# Patient Record
Sex: Male | Born: 1980 | Race: White | Hispanic: No | Marital: Married | Smoking: Current some day smoker
Health system: Southern US, Community
[De-identification: ages and names within clinical notes are randomized; demographics above are authoritative.]

## PROBLEM LIST (undated history)

## (undated) DIAGNOSIS — G931 Anoxic brain damage, not elsewhere classified: Secondary | ICD-10-CM

## (undated) DIAGNOSIS — W3400XA Accidental discharge from unspecified firearms or gun, initial encounter: Secondary | ICD-10-CM

## (undated) DIAGNOSIS — E669 Obesity, unspecified: Secondary | ICD-10-CM

## (undated) DIAGNOSIS — G8929 Other chronic pain: Secondary | ICD-10-CM

## (undated) DIAGNOSIS — N390 Urinary tract infection, site not specified: Secondary | ICD-10-CM

## (undated) DIAGNOSIS — G249 Dystonia, unspecified: Secondary | ICD-10-CM

## (undated) DIAGNOSIS — F329 Major depressive disorder, single episode, unspecified: Secondary | ICD-10-CM

## (undated) DIAGNOSIS — G8250 Quadriplegia, unspecified: Secondary | ICD-10-CM

## (undated) HISTORY — DX: Anoxic brain damage, not elsewhere classified: G93.1

## (undated) HISTORY — PX: TONSILLECTOMY: SUR1361

## (undated) HISTORY — DX: Accidental discharge from unspecified firearms or gun, initial encounter: W34.00XA

## (undated) HISTORY — DX: Dystonia, unspecified: G24.9

## (undated) HISTORY — DX: Quadriplegia, unspecified: G82.50

## (undated) SURGERY — Surgical Case
Anesthesia: *Unknown

---

## 2017-10-12 ENCOUNTER — Encounter: Payer: Self-pay | Admitting: Emergency Medicine

## 2017-10-12 ENCOUNTER — Emergency Department: Payer: Self-pay

## 2017-10-12 ENCOUNTER — Emergency Department
Admission: EM | Admit: 2017-10-12 | Discharge: 2017-10-12 | Discharge status: Against Medical Advice | Payer: Self-pay | Attending: Emergency Medicine | Admitting: Emergency Medicine

## 2017-10-12 DIAGNOSIS — R079 Chest pain, unspecified: Secondary | ICD-10-CM | POA: Insufficient documentation

## 2017-10-12 DIAGNOSIS — Z5321 Procedure and treatment not carried out due to patient leaving prior to being seen by health care provider: Secondary | ICD-10-CM | POA: Insufficient documentation

## 2017-10-12 LAB — CBC
HCT: 43.1 % (ref 40.0–52.0)
Hemoglobin: 15.5 g/dL (ref 13.0–18.0)
MCH: 31.6 pg (ref 26.0–34.0)
MCHC: 35.9 g/dL (ref 32.0–36.0)
MCV: 87.9 fL (ref 80.0–100.0)
Platelets: 307 10*3/uL (ref 150–440)
RBC: 4.9 MIL/uL (ref 4.40–5.90)
RDW: 15 % — ABNORMAL HIGH (ref 11.5–14.5)
WBC: 9.5 10*3/uL (ref 3.8–10.6)

## 2017-10-12 LAB — TROPONIN I: Troponin I: <0.03 ng/mL (ref <0.03)

## 2017-10-12 NOTE — ED Triage Notes (Signed)
Pt reports feels like someone is sitting on his chest and feels SOB and has some discomfort in his left arm.

## 2017-10-12 NOTE — ED Notes (Signed)
Called for room x 2.  

## 2017-10-12 NOTE — ED Notes (Signed)
Called for room x 1.  

## 2017-10-17 ENCOUNTER — Telehealth: Payer: Self-pay | Admitting: Emergency Medicine
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
## 2018-06-22 DIAGNOSIS — Z6836 Body mass index (BMI) 36.0-36.9, adult: Secondary | ICD-10-CM | POA: Insufficient documentation

## 2018-06-22 DIAGNOSIS — E6609 Other obesity due to excess calories: Secondary | ICD-10-CM | POA: Insufficient documentation

## 2018-06-22 DIAGNOSIS — F17210 Nicotine dependence, cigarettes, uncomplicated: Secondary | ICD-10-CM | POA: Insufficient documentation

## 2018-10-18 DIAGNOSIS — F331 Major depressive disorder, recurrent, moderate: Secondary | ICD-10-CM | POA: Diagnosis not present

## 2018-10-18 DIAGNOSIS — N529 Male erectile dysfunction, unspecified: Secondary | ICD-10-CM | POA: Diagnosis not present

## 2018-10-18 DIAGNOSIS — R41840 Attention and concentration deficit: Secondary | ICD-10-CM | POA: Diagnosis not present

## 2018-10-18 DIAGNOSIS — M549 Dorsalgia, unspecified: Secondary | ICD-10-CM | POA: Diagnosis not present

## 2018-12-13 DIAGNOSIS — M549 Dorsalgia, unspecified: Secondary | ICD-10-CM | POA: Diagnosis not present

## 2018-12-13 DIAGNOSIS — F5101 Primary insomnia: Secondary | ICD-10-CM | POA: Diagnosis not present

## 2018-12-13 DIAGNOSIS — R41840 Attention and concentration deficit: Secondary | ICD-10-CM | POA: Diagnosis not present

## 2018-12-13 DIAGNOSIS — F331 Major depressive disorder, recurrent, moderate: Secondary | ICD-10-CM | POA: Diagnosis not present

## 2019-03-14 DIAGNOSIS — R41840 Attention and concentration deficit: Secondary | ICD-10-CM | POA: Diagnosis not present

## 2019-03-14 DIAGNOSIS — F5101 Primary insomnia: Secondary | ICD-10-CM | POA: Diagnosis not present

## 2019-03-14 DIAGNOSIS — N529 Male erectile dysfunction, unspecified: Secondary | ICD-10-CM | POA: Diagnosis not present

## 2019-03-14 DIAGNOSIS — F331 Major depressive disorder, recurrent, moderate: Secondary | ICD-10-CM | POA: Diagnosis not present

## 2019-03-14 DIAGNOSIS — M549 Dorsalgia, unspecified: Secondary | ICD-10-CM | POA: Diagnosis not present

## 2019-03-28 DIAGNOSIS — Z20828 Contact with and (suspected) exposure to other viral communicable diseases: Secondary | ICD-10-CM | POA: Diagnosis not present

## 2019-03-28 DIAGNOSIS — U071 COVID-19: Secondary | ICD-10-CM | POA: Diagnosis not present

## 2019-04-08 DIAGNOSIS — Z20828 Contact with and (suspected) exposure to other viral communicable diseases: Secondary | ICD-10-CM | POA: Diagnosis not present

## 2019-04-08 DIAGNOSIS — U071 COVID-19: Secondary | ICD-10-CM | POA: Diagnosis not present

## 2019-04-11 DIAGNOSIS — Z20828 Contact with and (suspected) exposure to other viral communicable diseases: Secondary | ICD-10-CM | POA: Diagnosis not present

## 2019-04-11 DIAGNOSIS — U071 COVID-19: Secondary | ICD-10-CM | POA: Diagnosis not present

## 2019-06-06 ENCOUNTER — Inpatient Hospital Stay (HOSPITAL_COMMUNITY)
Admission: EM | Admit: 2019-06-06 | Discharge: 2019-07-19 | DRG: 003 | Disposition: A | Discharge status: Rehabilitation Facility | Payer: Medicaid Other | Attending: Surgery | Admitting: Surgery

## 2019-06-06 ENCOUNTER — Emergency Department (HOSPITAL_COMMUNITY): Payer: Medicaid Other

## 2019-06-06 ENCOUNTER — Inpatient Hospital Stay (HOSPITAL_COMMUNITY): Payer: Medicaid Other

## 2019-06-06 DIAGNOSIS — D62 Acute posthemorrhagic anemia: Secondary | ICD-10-CM

## 2019-06-06 DIAGNOSIS — G249 Dystonia, unspecified: Secondary | ICD-10-CM

## 2019-06-06 DIAGNOSIS — S069X3D Unspecified intracranial injury with loss of consciousness of 1 hour to 5 hours 59 minutes, subsequent encounter: Secondary | ICD-10-CM | POA: Diagnosis not present

## 2019-06-06 DIAGNOSIS — R0602 Shortness of breath: Secondary | ICD-10-CM | POA: Diagnosis not present

## 2019-06-06 DIAGNOSIS — G8250 Quadriplegia, unspecified: Secondary | ICD-10-CM | POA: Diagnosis not present

## 2019-06-06 DIAGNOSIS — Z01818 Encounter for other preprocedural examination: Secondary | ICD-10-CM

## 2019-06-06 DIAGNOSIS — Y92230 Patient room in hospital as the place of occurrence of the external cause: Secondary | ICD-10-CM | POA: Diagnosis not present

## 2019-06-06 DIAGNOSIS — F3130 Bipolar disorder, current episode depressed, mild or moderate severity, unspecified: Secondary | ICD-10-CM | POA: Diagnosis not present

## 2019-06-06 DIAGNOSIS — M62838 Other muscle spasm: Secondary | ICD-10-CM | POA: Diagnosis not present

## 2019-06-06 DIAGNOSIS — S0240CA Maxillary fracture, right side, initial encounter for closed fracture: Secondary | ICD-10-CM | POA: Diagnosis present

## 2019-06-06 DIAGNOSIS — J95851 Ventilator associated pneumonia: Secondary | ICD-10-CM | POA: Diagnosis not present

## 2019-06-06 DIAGNOSIS — Y95 Nosocomial condition: Secondary | ICD-10-CM | POA: Diagnosis not present

## 2019-06-06 DIAGNOSIS — Z6834 Body mass index (BMI) 34.0-34.9, adult: Secondary | ICD-10-CM

## 2019-06-06 DIAGNOSIS — G894 Chronic pain syndrome: Secondary | ICD-10-CM | POA: Diagnosis not present

## 2019-06-06 DIAGNOSIS — S0285XA Fracture of orbit, unspecified, initial encounter for closed fracture: Secondary | ICD-10-CM | POA: Diagnosis present

## 2019-06-06 DIAGNOSIS — I10 Essential (primary) hypertension: Secondary | ICD-10-CM | POA: Diagnosis not present

## 2019-06-06 DIAGNOSIS — I469 Cardiac arrest, cause unspecified: Secondary | ICD-10-CM | POA: Diagnosis not present

## 2019-06-06 DIAGNOSIS — S022XXA Fracture of nasal bones, initial encounter for closed fracture: Secondary | ICD-10-CM | POA: Diagnosis present

## 2019-06-06 DIAGNOSIS — S0242XD Fracture of alveolus of maxilla, subsequent encounter for fracture with routine healing: Secondary | ICD-10-CM | POA: Diagnosis not present

## 2019-06-06 DIAGNOSIS — Y838 Other surgical procedures as the cause of abnormal reaction of the patient, or of later complication, without mention of misadventure at the time of the procedure: Secondary | ICD-10-CM | POA: Diagnosis not present

## 2019-06-06 DIAGNOSIS — S069X2S Unspecified intracranial injury with loss of consciousness of 31 minutes to 59 minutes, sequela: Secondary | ICD-10-CM | POA: Diagnosis not present

## 2019-06-06 DIAGNOSIS — S058X1A Other injuries of right eye and orbit, initial encounter: Secondary | ICD-10-CM | POA: Diagnosis present

## 2019-06-06 DIAGNOSIS — H543 Unqualified visual loss, both eyes: Secondary | ICD-10-CM | POA: Diagnosis present

## 2019-06-06 DIAGNOSIS — Y848 Other medical procedures as the cause of abnormal reaction of the patient, or of later complication, without mention of misadventure at the time of the procedure: Secondary | ICD-10-CM | POA: Diagnosis not present

## 2019-06-06 DIAGNOSIS — Z79899 Other long term (current) drug therapy: Secondary | ICD-10-CM

## 2019-06-06 DIAGNOSIS — S0219XA Other fracture of base of skull, initial encounter for closed fracture: Principal | ICD-10-CM | POA: Diagnosis present

## 2019-06-06 DIAGNOSIS — Y249XXA Unspecified firearm discharge, undetermined intent, initial encounter: Secondary | ICD-10-CM

## 2019-06-06 DIAGNOSIS — Z9981 Dependence on supplemental oxygen: Secondary | ICD-10-CM | POA: Diagnosis not present

## 2019-06-06 DIAGNOSIS — Z818 Family history of other mental and behavioral disorders: Secondary | ICD-10-CM

## 2019-06-06 DIAGNOSIS — F0781 Postconcussional syndrome: Secondary | ICD-10-CM | POA: Diagnosis not present

## 2019-06-06 DIAGNOSIS — S06339A Contusion and laceration of cerebrum, unspecified, with loss of consciousness of unspecified duration, initial encounter: Secondary | ICD-10-CM | POA: Diagnosis present

## 2019-06-06 DIAGNOSIS — Y833 Surgical operation with formation of external stoma as the cause of abnormal reaction of the patient, or of later complication, without mention of misadventure at the time of the procedure: Secondary | ICD-10-CM | POA: Diagnosis not present

## 2019-06-06 DIAGNOSIS — T8130XA Disruption of wound, unspecified, initial encounter: Secondary | ICD-10-CM | POA: Diagnosis not present

## 2019-06-06 DIAGNOSIS — J150 Pneumonia due to Klebsiella pneumoniae: Secondary | ICD-10-CM | POA: Diagnosis not present

## 2019-06-06 DIAGNOSIS — E668 Other obesity: Secondary | ICD-10-CM | POA: Diagnosis present

## 2019-06-06 DIAGNOSIS — F909 Attention-deficit hyperactivity disorder, unspecified type: Secondary | ICD-10-CM | POA: Diagnosis present

## 2019-06-06 DIAGNOSIS — G8929 Other chronic pain: Secondary | ICD-10-CM | POA: Diagnosis present

## 2019-06-06 DIAGNOSIS — R1310 Dysphagia, unspecified: Secondary | ICD-10-CM

## 2019-06-06 DIAGNOSIS — T859XXD Unspecified complication of internal prosthetic device, implant and graft, subsequent encounter: Secondary | ICD-10-CM

## 2019-06-06 DIAGNOSIS — Z23 Encounter for immunization: Secondary | ICD-10-CM | POA: Diagnosis not present

## 2019-06-06 DIAGNOSIS — Z20822 Contact with and (suspected) exposure to covid-19: Secondary | ICD-10-CM | POA: Diagnosis not present

## 2019-06-06 DIAGNOSIS — Z9911 Dependence on respirator [ventilator] status: Secondary | ICD-10-CM

## 2019-06-06 DIAGNOSIS — J9601 Acute respiratory failure with hypoxia: Secondary | ICD-10-CM | POA: Diagnosis not present

## 2019-06-06 DIAGNOSIS — Z915 Personal history of self-harm: Secondary | ICD-10-CM

## 2019-06-06 DIAGNOSIS — S0183XA Puncture wound without foreign body of other part of head, initial encounter: Secondary | ICD-10-CM

## 2019-06-06 DIAGNOSIS — R000 Tachycardia, unspecified: Secondary | ICD-10-CM | POA: Diagnosis not present

## 2019-06-06 DIAGNOSIS — T8131XA Disruption of external operation (surgical) wound, not elsewhere classified, initial encounter: Secondary | ICD-10-CM | POA: Diagnosis not present

## 2019-06-06 DIAGNOSIS — W3400XA Accidental discharge from unspecified firearms or gun, initial encounter: Secondary | ICD-10-CM | POA: Diagnosis not present

## 2019-06-06 DIAGNOSIS — H40051 Ocular hypertension, right eye: Secondary | ICD-10-CM | POA: Diagnosis present

## 2019-06-06 DIAGNOSIS — E876 Hypokalemia: Secondary | ICD-10-CM | POA: Diagnosis not present

## 2019-06-06 DIAGNOSIS — J9690 Respiratory failure, unspecified, unspecified whether with hypoxia or hypercapnia: Secondary | ICD-10-CM

## 2019-06-06 DIAGNOSIS — R4182 Altered mental status, unspecified: Secondary | ICD-10-CM | POA: Diagnosis not present

## 2019-06-06 DIAGNOSIS — F319 Bipolar disorder, unspecified: Secondary | ICD-10-CM | POA: Diagnosis not present

## 2019-06-06 DIAGNOSIS — Z931 Gastrostomy status: Secondary | ICD-10-CM

## 2019-06-06 DIAGNOSIS — S069X9S Unspecified intracranial injury with loss of consciousness of unspecified duration, sequela: Secondary | ICD-10-CM | POA: Diagnosis not present

## 2019-06-06 DIAGNOSIS — J939 Pneumothorax, unspecified: Secondary | ICD-10-CM | POA: Diagnosis not present

## 2019-06-06 DIAGNOSIS — F3131 Bipolar disorder, current episode depressed, mild: Secondary | ICD-10-CM | POA: Diagnosis present

## 2019-06-06 DIAGNOSIS — J189 Pneumonia, unspecified organism: Secondary | ICD-10-CM

## 2019-06-06 DIAGNOSIS — R7401 Elevation of levels of liver transaminase levels: Secondary | ICD-10-CM | POA: Diagnosis not present

## 2019-06-06 DIAGNOSIS — Z4659 Encounter for fitting and adjustment of other gastrointestinal appliance and device: Secondary | ICD-10-CM

## 2019-06-06 DIAGNOSIS — Z789 Other specified health status: Secondary | ICD-10-CM

## 2019-06-06 DIAGNOSIS — F17210 Nicotine dependence, cigarettes, uncomplicated: Secondary | ICD-10-CM | POA: Diagnosis present

## 2019-06-06 DIAGNOSIS — S022XXD Fracture of nasal bones, subsequent encounter for fracture with routine healing: Secondary | ICD-10-CM | POA: Diagnosis not present

## 2019-06-06 DIAGNOSIS — K9423 Gastrostomy malfunction: Secondary | ICD-10-CM | POA: Diagnosis not present

## 2019-06-06 DIAGNOSIS — M4802 Spinal stenosis, cervical region: Secondary | ICD-10-CM | POA: Diagnosis not present

## 2019-06-06 DIAGNOSIS — S0242XA Fracture of alveolus of maxilla, initial encounter for closed fracture: Secondary | ICD-10-CM | POA: Diagnosis present

## 2019-06-06 DIAGNOSIS — R443 Hallucinations, unspecified: Secondary | ICD-10-CM | POA: Diagnosis not present

## 2019-06-06 DIAGNOSIS — F1290 Cannabis use, unspecified, uncomplicated: Secondary | ICD-10-CM | POA: Diagnosis present

## 2019-06-06 DIAGNOSIS — T07XXXA Unspecified multiple injuries, initial encounter: Secondary | ICD-10-CM | POA: Diagnosis not present

## 2019-06-06 DIAGNOSIS — X749XXA Intentional self-harm by unspecified firearm discharge, initial encounter: Secondary | ICD-10-CM

## 2019-06-06 DIAGNOSIS — T1490XA Injury, unspecified, initial encounter: Secondary | ICD-10-CM | POA: Diagnosis not present

## 2019-06-06 DIAGNOSIS — S0219XD Other fracture of base of skull, subsequent encounter for fracture with routine healing: Secondary | ICD-10-CM | POA: Diagnosis not present

## 2019-06-06 DIAGNOSIS — R7303 Prediabetes: Secondary | ICD-10-CM | POA: Diagnosis not present

## 2019-06-06 DIAGNOSIS — F4310 Post-traumatic stress disorder, unspecified: Secondary | ICD-10-CM | POA: Diagnosis not present

## 2019-06-06 DIAGNOSIS — J9503 Malfunction of tracheostomy stoma: Secondary | ICD-10-CM | POA: Diagnosis not present

## 2019-06-06 DIAGNOSIS — Z79891 Long term (current) use of opiate analgesic: Secondary | ICD-10-CM

## 2019-06-06 DIAGNOSIS — D3130 Benign neoplasm of unspecified choroid: Secondary | ICD-10-CM | POA: Diagnosis present

## 2019-06-06 DIAGNOSIS — M6289 Other specified disorders of muscle: Secondary | ICD-10-CM | POA: Diagnosis not present

## 2019-06-06 DIAGNOSIS — N390 Urinary tract infection, site not specified: Secondary | ICD-10-CM | POA: Diagnosis not present

## 2019-06-06 DIAGNOSIS — G931 Anoxic brain damage, not elsewhere classified: Secondary | ICD-10-CM | POA: Diagnosis not present

## 2019-06-06 DIAGNOSIS — R252 Cramp and spasm: Secondary | ICD-10-CM | POA: Diagnosis not present

## 2019-06-06 DIAGNOSIS — Z4682 Encounter for fitting and adjustment of non-vascular catheter: Secondary | ICD-10-CM

## 2019-06-06 DIAGNOSIS — M5441 Lumbago with sciatica, right side: Secondary | ICD-10-CM | POA: Diagnosis present

## 2019-06-06 DIAGNOSIS — S0184XD Puncture wound with foreign body of other part of head, subsequent encounter: Secondary | ICD-10-CM | POA: Diagnosis not present

## 2019-06-06 DIAGNOSIS — S069X3S Unspecified intracranial injury with loss of consciousness of 1 hour to 5 hours 59 minutes, sequela: Secondary | ICD-10-CM | POA: Diagnosis not present

## 2019-06-06 DIAGNOSIS — E870 Hyperosmolality and hypernatremia: Secondary | ICD-10-CM | POA: Diagnosis not present

## 2019-06-06 DIAGNOSIS — R030 Elevated blood-pressure reading, without diagnosis of hypertension: Secondary | ICD-10-CM | POA: Diagnosis not present

## 2019-06-06 DIAGNOSIS — R739 Hyperglycemia, unspecified: Secondary | ICD-10-CM | POA: Diagnosis not present

## 2019-06-06 DIAGNOSIS — H05233 Hemorrhage of bilateral orbit: Secondary | ICD-10-CM | POA: Diagnosis present

## 2019-06-06 DIAGNOSIS — I639 Cerebral infarction, unspecified: Secondary | ICD-10-CM | POA: Diagnosis not present

## 2019-06-06 HISTORY — DX: Other chronic pain: G89.29

## 2019-06-06 HISTORY — DX: Major depressive disorder, single episode, unspecified: F32.9

## 2019-06-06 HISTORY — DX: Obesity, unspecified: E66.9

## 2019-06-06 LAB — CBC
HCT: 44.3 % (ref 39.0–52.0)
Hemoglobin: 14.9 g/dL (ref 13.0–17.0)
MCH: 31.8 pg (ref 26.0–34.0)
MCHC: 33.6 g/dL (ref 30.0–36.0)
MCV: 94.5 fL (ref 80.0–100.0)
Platelets: 399 10*3/uL (ref 150–400)
RBC: 4.69 MIL/uL (ref 4.22–5.81)
RDW: 14.3 % (ref 11.5–15.5)
WBC: 9.7 10*3/uL (ref 4.0–10.5)
nRBC: 0 % (ref 0.0–0.2)

## 2019-06-06 LAB — HEMOGLOBIN A1C
Hgb A1c MFr Bld: 6.2 % — ABNORMAL HIGH (ref 4.8–5.6)
Mean Plasma Glucose: 131.24 mg/dL

## 2019-06-06 LAB — COMPREHENSIVE METABOLIC PANEL
ALT: 45 U/L — ABNORMAL HIGH (ref 0–44)
AST: 37 U/L (ref 15–41)
Albumin: 3.5 g/dL (ref 3.5–5.0)
Alkaline Phosphatase: 80 U/L (ref 38–126)
Anion gap: 15 (ref 5–15)
BUN: 9 mg/dL (ref 6–20)
CO2: 20 mmol/L — ABNORMAL LOW (ref 22–32)
Calcium: 8.6 mg/dL — ABNORMAL LOW (ref 8.9–10.3)
Chloride: 102 mmol/L (ref 98–111)
Creatinine, Ser: 1.24 mg/dL (ref 0.61–1.24)
GFR calc Af Amer: >60 mL/min (ref >60)
GFR calc non Af Amer: >60 mL/min (ref >60)
Glucose, Bld: 226 mg/dL — ABNORMAL HIGH (ref 70–99)
Potassium: 4.5 mmol/L (ref 3.5–5.1)
Sodium: 137 mmol/L (ref 135–145)
Total Bilirubin: 1.1 mg/dL (ref 0.3–1.2)
Total Protein: 6.5 g/dL (ref 6.5–8.1)

## 2019-06-06 LAB — POCT I-STAT 7, (LYTES, BLD GAS, ICA,H+H)
Acid-Base Excess: 0 mmol/L (ref 0.0–2.0)
Bicarbonate: 27.3 mmol/L (ref 20.0–28.0)
Calcium, Ion: 1.23 mmol/L (ref 1.15–1.40)
HCT: 44 % (ref 39.0–52.0)
Hemoglobin: 15 g/dL (ref 13.0–17.0)
O2 Saturation: 100 %
Patient temperature: 95.7
Potassium: 3.8 mmol/L (ref 3.5–5.1)
Sodium: 141 mmol/L (ref 135–145)
TCO2: 29 mmol/L (ref 22–32)
pCO2 arterial: 47.8 mmHg (ref 32.0–48.0)
pH, Arterial: 7.357 (ref 7.350–7.450)
pO2, Arterial: 294 mmHg — ABNORMAL HIGH (ref 83.0–108.0)

## 2019-06-06 LAB — I-STAT CHEM 8, ED
BUN: 10 mg/dL (ref 6–20)
Calcium, Ion: 1.01 mmol/L — ABNORMAL LOW (ref 1.15–1.40)
Chloride: 105 mmol/L (ref 98–111)
Creatinine, Ser: 1.2 mg/dL (ref 0.61–1.24)
Glucose, Bld: 224 mg/dL — ABNORMAL HIGH (ref 70–99)
HCT: 45 % (ref 39.0–52.0)
Hemoglobin: 15.3 g/dL (ref 13.0–17.0)
Potassium: 3.7 mmol/L (ref 3.5–5.1)
Sodium: 139 mmol/L (ref 135–145)
TCO2: 21 mmol/L — ABNORMAL LOW (ref 22–32)

## 2019-06-06 LAB — ETHANOL: Alcohol, Ethyl (B): <10 mg/dL (ref <10)

## 2019-06-06 LAB — GLUCOSE, CAPILLARY: Glucose-Capillary: 123 mg/dL — ABNORMAL HIGH (ref 70–99)

## 2019-06-06 LAB — LACTIC ACID, PLASMA
Lactic Acid, Venous: 3.3 mmol/L (ref 0.5–1.9)
Lactic Acid, Venous: 1.8 mmol/L (ref 0.5–1.9)

## 2019-06-06 LAB — SAMPLE TO BLOOD BANK

## 2019-06-06 LAB — PROTIME-INR
INR: 0.9 (ref 0.8–1.2)
Prothrombin Time: 12 seconds (ref 11.4–15.2)

## 2019-06-06 LAB — TRIGLYCERIDES: Triglycerides: 222 mg/dL — ABNORMAL HIGH (ref <150)

## 2019-06-06 LAB — SARS CORONAVIRUS 2 BY RT PCR (HOSPITAL ORDER, PERFORMED IN ~~LOC~~ HOSPITAL LAB): SARS Coronavirus 2: NEGATIVE

## 2019-06-06 MED ORDER — ROCURONIUM BROMIDE 10 MG/ML (PF) SYRINGE
PREFILLED_SYRINGE | INTRAVENOUS | Status: AC
  Filled 2019-06-06: qty 10

## 2019-06-06 MED ORDER — PROPOFOL 1000 MG/100ML IV EMUL
INTRAVENOUS | Status: AC
  Filled 2019-06-06: qty 100

## 2019-06-06 MED ORDER — CHLORHEXIDINE GLUCONATE CLOTH 2 % EX PADS
6.0000 | MEDICATED_PAD | Freq: Every day | CUTANEOUS | Status: DC
  Administered 2019-06-07 – 2019-06-11 (×5): 6 via TOPICAL

## 2019-06-06 MED ORDER — MIDAZOLAM HCL 5 MG/5ML IJ SOLN
INTRAMUSCULAR | Status: AC | PRN
  Administered 2019-06-06: 4 mg via INTRAVENOUS

## 2019-06-06 MED ORDER — FENTANYL 2500MCG IN NS 250ML (10MCG/ML) PREMIX INFUSION
25.0000 ug/h | INTRAVENOUS | Status: DC
  Administered 2019-06-06: 140 ug/h via INTRAVENOUS
  Filled 2019-06-06: qty 250

## 2019-06-06 MED ORDER — TETANUS-DIPHTH-ACELL PERTUSSIS 5-2.5-18.5 LF-MCG/0.5 IM SUSP
0.5000 mL | Freq: Once | INTRAMUSCULAR | Status: AC
  Administered 2019-06-06: 0.5 mL via INTRAMUSCULAR
  Filled 2019-06-06: qty 0.5

## 2019-06-06 MED ORDER — ONDANSETRON 4 MG PO TBDP: 4.0000 mg | ORAL_TABLET | Freq: Four times a day (QID) | ORAL | Status: DC | PRN

## 2019-06-06 MED ORDER — MIDAZOLAM HCL 2 MG/2ML IJ SOLN
INTRAMUSCULAR | Status: AC
  Filled 2019-06-06: qty 4

## 2019-06-06 MED ORDER — ROCURONIUM BROMIDE 50 MG/5ML IV SOLN
INTRAVENOUS | Status: AC | PRN
  Administered 2019-06-06: 100 mg via INTRAVENOUS

## 2019-06-06 MED ORDER — INSULIN ASPART 100 UNIT/ML ~~LOC~~ SOLN
0.0000 [IU] | Freq: Three times a day (TID) | SUBCUTANEOUS | Status: DC
  Administered 2019-06-07 (×3): 2 [IU] via SUBCUTANEOUS
  Administered 2019-06-08: 3 [IU] via SUBCUTANEOUS
  Administered 2019-06-08: 2 [IU] via SUBCUTANEOUS
  Administered 2019-06-08: 3 [IU] via SUBCUTANEOUS
  Administered 2019-06-09 – 2019-06-10 (×3): 2 [IU] via SUBCUTANEOUS

## 2019-06-06 MED ORDER — ONDANSETRON HCL 4 MG/2ML IJ SOLN
4.0000 mg | Freq: Four times a day (QID) | INTRAMUSCULAR | Status: DC | PRN
  Administered 2019-06-15: 4 mg via INTRAVENOUS
  Filled 2019-06-06: qty 2

## 2019-06-06 MED ORDER — FENTANYL BOLUS VIA INFUSION
50.0000 ug | INTRAVENOUS | Status: DC | PRN
  Administered 2019-06-10 – 2019-06-11 (×4): 50 ug via INTRAVENOUS
  Filled 2019-06-06: qty 50

## 2019-06-06 MED ORDER — IOHEXOL 350 MG/ML SOLN
75.0000 mL | Freq: Once | INTRAVENOUS | Status: AC | PRN
  Administered 2019-06-06: 75 mL via INTRAVENOUS

## 2019-06-06 MED ORDER — SODIUM CHLORIDE 0.9 % IV SOLN: INTRAVENOUS | Status: DC

## 2019-06-06 MED ORDER — FENTANYL BOLUS VIA INFUSION
50.0000 ug | INTRAVENOUS | Status: DC | PRN
  Administered 2019-06-15: 50 ug via INTRAVENOUS
  Filled 2019-06-06: qty 50

## 2019-06-06 MED ORDER — MIDAZOLAM HCL 2 MG/2ML IJ SOLN: 2.0000 mg | INTRAMUSCULAR | Status: DC | PRN

## 2019-06-06 MED ORDER — PROPOFOL 1000 MG/100ML IV EMUL
0.0000 ug/kg/min | INTRAVENOUS | Status: DC
  Administered 2019-06-06: 40 ug/kg/min via INTRAVENOUS

## 2019-06-06 MED ORDER — DOCUSATE SODIUM 50 MG/5ML PO LIQD
100.0000 mg | Freq: Two times a day (BID) | ORAL | Status: DC
  Filled 2019-06-06 (×2): qty 10

## 2019-06-06 MED ORDER — ORAL CARE MOUTH RINSE
15.0000 mL | OROMUCOSAL | Status: DC
  Administered 2019-06-06 – 2019-07-08 (×293): 15 mL via OROMUCOSAL

## 2019-06-06 MED ORDER — FENTANYL CITRATE (PF) 100 MCG/2ML IJ SOLN: 50.0000 ug | Freq: Once | INTRAMUSCULAR | Status: DC

## 2019-06-06 MED ORDER — POLYETHYLENE GLYCOL 3350 17 G PO PACK
17.0000 g | PACK | Freq: Every day | ORAL | Status: DC
  Filled 2019-06-06: qty 1

## 2019-06-06 MED ORDER — FENTANYL CITRATE (PF) 100 MCG/2ML IJ SOLN
INTRAMUSCULAR | Status: AC | PRN
  Administered 2019-06-06: 100 ug via INTRAVENOUS

## 2019-06-06 MED ORDER — SODIUM CHLORIDE 0.9 % IV SOLN: INTRAVENOUS | Status: DC | PRN

## 2019-06-06 MED ORDER — CEFAZOLIN SODIUM-DEXTROSE 2-4 GM/100ML-% IV SOLN
2.0000 g | Freq: Once | INTRAVENOUS | Status: AC
  Administered 2019-06-06: 2 g via INTRAVENOUS
  Filled 2019-06-06: qty 100

## 2019-06-06 MED ORDER — CHLORHEXIDINE GLUCONATE 0.12% ORAL RINSE (MEDLINE KIT)
15.0000 mL | Freq: Two times a day (BID) | OROMUCOSAL | Status: DC
  Administered 2019-06-06 – 2019-07-08 (×63): 15 mL via OROMUCOSAL

## 2019-06-06 MED ORDER — PROPOFOL 1000 MG/100ML IV EMUL
0.0000 ug/kg/min | INTRAVENOUS | Status: DC
  Administered 2019-06-07 (×5): 50 ug/kg/min via INTRAVENOUS
  Administered 2019-06-08: 30 ug/kg/min via INTRAVENOUS
  Administered 2019-06-08 (×2): 50 ug/kg/min via INTRAVENOUS
  Administered 2019-06-08: 30 ug/kg/min via INTRAVENOUS
  Administered 2019-06-08 (×2): 50 ug/kg/min via INTRAVENOUS
  Administered 2019-06-08: 40 ug/kg/min via INTRAVENOUS
  Administered 2019-06-09: 30 ug/kg/min via INTRAVENOUS
  Administered 2019-06-09: 50 ug/kg/min via INTRAVENOUS
  Administered 2019-06-09: 25 ug/kg/min via INTRAVENOUS
  Administered 2019-06-09: 50 ug/kg/min via INTRAVENOUS
  Administered 2019-06-09: 40 ug/kg/min via INTRAVENOUS
  Administered 2019-06-09: 50 ug/kg/min via INTRAVENOUS
  Administered 2019-06-10 – 2019-06-11 (×7): 30 ug/kg/min via INTRAVENOUS
  Administered 2019-06-11: 25 ug/kg/min via INTRAVENOUS
  Administered 2019-06-11: 20 ug/kg/min via INTRAVENOUS
  Administered 2019-06-11: 30 ug/kg/min via INTRAVENOUS
  Administered 2019-06-12 – 2019-06-13 (×3): 20 ug/kg/min via INTRAVENOUS
  Filled 2019-06-06 (×4): qty 100
  Filled 2019-06-06: qty 200
  Filled 2019-06-06: qty 100
  Filled 2019-06-06: qty 200
  Filled 2019-06-06 (×30): qty 100
  Filled 2019-06-06: qty 200

## 2019-06-06 MED ORDER — HYDRALAZINE HCL 20 MG/ML IJ SOLN
20.0000 mg | INTRAMUSCULAR | Status: DC | PRN
  Administered 2019-06-08 – 2019-06-21 (×4): 20 mg via INTRAVENOUS
  Filled 2019-06-06 (×6): qty 1

## 2019-06-06 MED ORDER — ETOMIDATE 2 MG/ML IV SOLN
INTRAVENOUS | Status: AC | PRN
  Administered 2019-06-06: 20 mg via INTRAVENOUS

## 2019-06-06 MED ORDER — FENTANYL 2500MCG IN NS 250ML (10MCG/ML) PREMIX INFUSION
25.0000 ug/h | INTRAVENOUS | Status: DC
  Administered 2019-06-07: 200 ug/h via INTRAVENOUS
  Administered 2019-06-07: 100 ug/h via INTRAVENOUS
  Administered 2019-06-08 – 2019-06-10 (×6): 200 ug/h via INTRAVENOUS
  Administered 2019-06-11: 150 ug/h via INTRAVENOUS
  Administered 2019-06-11: 200 ug/h via INTRAVENOUS
  Administered 2019-06-12 – 2019-06-13 (×2): 150 ug/h via INTRAVENOUS
  Administered 2019-06-14 – 2019-06-16 (×2): 100 ug/h via INTRAVENOUS
  Administered 2019-06-17 – 2019-06-18 (×2): 50 ug/h via INTRAVENOUS
  Administered 2019-06-19: 200 ug/h via INTRAVENOUS
  Administered 2019-06-20: 125 ug/h via INTRAVENOUS
  Administered 2019-06-20 – 2019-06-21 (×3): 100 ug/h via INTRAVENOUS
  Administered 2019-06-22: 75 ug/h via INTRAVENOUS
  Filled 2019-06-06 (×25): qty 250

## 2019-06-06 MED ORDER — NICARDIPINE HCL IN NACL 20-0.86 MG/200ML-% IV SOLN
3.0000 mg/h | INTRAVENOUS | Status: DC
  Administered 2019-06-06: 5 mg/h via INTRAVENOUS
  Administered 2019-06-06 – 2019-06-07 (×2): 7.5 mg/h via INTRAVENOUS
  Filled 2019-06-06 (×5): qty 200

## 2019-06-06 MED ORDER — SUCCINYLCHOLINE CHLORIDE 20 MG/ML IJ SOLN
INTRAMUSCULAR | Status: AC | PRN
  Administered 2019-06-06: 150 mg via INTRAVENOUS

## 2019-06-06 MED ORDER — OXYMETAZOLINE HCL 0.05 % NA SOLN
1.0000 | Freq: Once | NASAL | Status: DC
  Filled 2019-06-06: qty 30
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
## 2019-06-06 NOTE — Procedures (Signed)
Intubation Procedure Note Jaz Mallick [id number] 03-28-1980  Procedure: Intubation Indications: Airway protection and maintenance  Procedure Details Consent: Unable to obtain consent because of emergent medical necessity. Time Out: Verified patient identification, verified procedure, site/side was marked, verified correct patient position, special equipment/implants available, medications/allergies/relevent history reviewed, required imaging and test results available.  Performed  Maximum sterile technique was used including gloves and gown.  MAC and 4    Evaluation Hemodynamic Status: BP stable throughout; O2 sats: stable throughout [name] Current Condition: stable Complications: No apparent complications Patient did tolerate procedure well. Chest X-ray ordered to verify placement.  CXR: tube position acceptable.   Rudene Re 06/06/2019

## 2019-06-06 NOTE — ED Notes (Signed)
Right eye is dilated per opthamology
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
## 2019-06-06 NOTE — Progress Notes (Signed)
Post intubation ABG results obtained on ventilator settings of VT: 620, RR: 16, FIO2: 100%, and PEEP:5.  Increased RR to 18 and decreased FIO2 to 60%.  Will continue to monitor.    Ref. Range 06/06/2019 16:44  Sample type Unknown ARTERIAL  pH, Arterial Latest Ref Range: 7.350 - 7.450  7.357  pCO2 arterial Latest Ref Range: 32.0 - 48.0 mmHg 47.8  pO2, Arterial Latest Ref Range: 83.0 - 108.0 mmHg 294 (H)  TCO2 Latest Ref Range: 22 - 32 mmol/L 29  Acid-Base Excess Latest Ref Range: 0.0 - 2.0 mmol/L 0.0  Bicarbonate Latest Ref Range: 20.0 - 28.0 mmol/L 27.3  O2 Saturation Latest Units: % 100.0  Patient temperature Unknown 95.7 F  Collection site Unknown Radial

## 2019-06-06 NOTE — ED Notes (Signed)
Intraoccular pressure measured by ED resident
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
## 2019-06-06 NOTE — ED Notes (Signed)
[place] [employer] Sellers---[phone]  PLEASE NOTIFY IF PATIENT IS BEING TRANSFERRED OR DISCHArRGED>

## 2019-06-07 ENCOUNTER — Inpatient Hospital Stay (HOSPITAL_COMMUNITY): Payer: Medicaid Other

## 2019-06-07 ENCOUNTER — Inpatient Hospital Stay (HOSPITAL_COMMUNITY): Payer: Medicaid Other | Admitting: Certified Registered"

## 2019-06-07 ENCOUNTER — Inpatient Hospital Stay (HOSPITAL_COMMUNITY): Payer: Medicaid Other | Admitting: Anesthesiology

## 2019-06-07 ENCOUNTER — Encounter (HOSPITAL_COMMUNITY): Admission: EM | Disposition: A | Discharge status: Rehabilitation Facility | Payer: Self-pay | Source: Home / Self Care

## 2019-06-07 ENCOUNTER — Encounter (HOSPITAL_COMMUNITY): Payer: Self-pay

## 2019-06-07 HISTORY — PX: TRACHEOSTOMY TUBE PLACEMENT: SHX814

## 2019-06-07 LAB — POCT I-STAT 7, (LYTES, BLD GAS, ICA,H+H)
Acid-base deficit: 10 mmol/L — ABNORMAL HIGH (ref 0.0–2.0)
Bicarbonate: 22.9 mmol/L (ref 20.0–28.0)
Calcium, Ion: 1.35 mmol/L (ref 1.15–1.40)
HCT: 39 % (ref 39.0–52.0)
Hemoglobin: 13.3 g/dL (ref 13.0–17.0)
O2 Saturation: 92 %
Patient temperature: 98.7
Potassium: 3.5 mmol/L (ref 3.5–5.1)
Sodium: 141 mmol/L (ref 135–145)
TCO2: 26 mmol/L (ref 22–32)
pCO2 arterial: 90.6 mmHg (ref 32.0–48.0)
pH, Arterial: 7.011 — CL (ref 7.350–7.450)
pO2, Arterial: 98 mmHg (ref 83.0–108.0)

## 2019-06-07 LAB — GLUCOSE, CAPILLARY
Glucose-Capillary: 144 mg/dL — ABNORMAL HIGH (ref 70–99)
Glucose-Capillary: 138 mg/dL — ABNORMAL HIGH (ref 70–99)
Glucose-Capillary: 137 mg/dL — ABNORMAL HIGH (ref 70–99)
Glucose-Capillary: 215 mg/dL — ABNORMAL HIGH (ref 70–99)

## 2019-06-07 LAB — BASIC METABOLIC PANEL
Anion gap: 8 (ref 5–15)
BUN: 10 mg/dL (ref 6–20)
CO2: 26 mmol/L (ref 22–32)
Calcium: 8.1 mg/dL — ABNORMAL LOW (ref 8.9–10.3)
Chloride: 104 mmol/L (ref 98–111)
Creatinine, Ser: 1.06 mg/dL (ref 0.61–1.24)
GFR calc Af Amer: >60 mL/min (ref >60)
GFR calc non Af Amer: >60 mL/min (ref >60)
Glucose, Bld: 133 mg/dL — ABNORMAL HIGH (ref 70–99)
Potassium: 4.2 mmol/L (ref 3.5–5.1)
Sodium: 138 mmol/L (ref 135–145)

## 2019-06-07 LAB — CBC
HCT: 42.5 % (ref 39.0–52.0)
Hemoglobin: 14.2 g/dL (ref 13.0–17.0)
MCH: 31.3 pg (ref 26.0–34.0)
MCHC: 33.4 g/dL (ref 30.0–36.0)
MCV: 93.8 fL (ref 80.0–100.0)
Platelets: 309 10*3/uL (ref 150–400)
RBC: 4.53 MIL/uL (ref 4.22–5.81)
RDW: 14.6 % (ref 11.5–15.5)
WBC: 9.8 10*3/uL (ref 4.0–10.5)
nRBC: 0 % (ref 0.0–0.2)

## 2019-06-07 LAB — TRIGLYCERIDES: Triglycerides: 169 mg/dL — ABNORMAL HIGH (ref <150)

## 2019-06-07 SURGERY — CREATION, TRACHEOSTOMY
Anesthesia: General | Site: Neck

## 2019-06-07 MED ORDER — MIDAZOLAM 50MG/50ML (1MG/ML) PREMIX INFUSION: 0.5000 mg/h | INTRAVENOUS | Status: DC

## 2019-06-07 MED ORDER — POLYETHYLENE GLYCOL 3350 17 G PO PACK
17.0000 g | PACK | Freq: Every day | ORAL | Status: DC
  Administered 2019-06-08 – 2019-06-22 (×8): 17 g
  Filled 2019-06-07 (×10): qty 1

## 2019-06-07 MED ORDER — MIDAZOLAM HCL 5 MG/5ML IJ SOLN
INTRAMUSCULAR | Status: DC | PRN
  Administered 2019-06-07 (×2): 2 mg via INTRAVENOUS

## 2019-06-07 MED ORDER — ROCURONIUM BROMIDE 10 MG/ML (PF) SYRINGE
PREFILLED_SYRINGE | INTRAVENOUS | Status: AC
  Filled 2019-06-07: qty 10

## 2019-06-07 MED ORDER — PROPOFOL 10 MG/ML IV BOLUS
INTRAVENOUS | Status: DC | PRN
  Administered 2019-06-07: 50 mg via INTRAVENOUS

## 2019-06-07 MED ORDER — DOCUSATE SODIUM 50 MG/5ML PO LIQD
100.0000 mg | Freq: Two times a day (BID) | ORAL | Status: DC
  Administered 2019-06-07 – 2019-06-27 (×22): 100 mg
  Filled 2019-06-07 (×28): qty 10

## 2019-06-07 MED ORDER — MIDAZOLAM HCL 2 MG/2ML IJ SOLN
INTRAMUSCULAR | Status: AC
  Filled 2019-06-07: qty 2

## 2019-06-07 MED ORDER — ROCURONIUM BROMIDE 10 MG/ML (PF) SYRINGE
PREFILLED_SYRINGE | INTRAVENOUS | Status: DC | PRN
  Administered 2019-06-07: 30 mg via INTRAVENOUS
  Administered 2019-06-07 (×2): 20 mg via INTRAVENOUS

## 2019-06-07 MED ORDER — ENOXAPARIN SODIUM 40 MG/0.4ML ~~LOC~~ SOLN
40.0000 mg | Freq: Two times a day (BID) | SUBCUTANEOUS | Status: DC
  Administered 2019-06-07 – 2019-07-19 (×83): 40 mg via SUBCUTANEOUS
  Filled 2019-06-07 (×85): qty 0.4

## 2019-06-07 MED ORDER — FENTANYL CITRATE (PF) 250 MCG/5ML IJ SOLN
INTRAMUSCULAR | Status: AC
  Filled 2019-06-07: qty 5

## 2019-06-07 MED ORDER — IPRATROPIUM-ALBUTEROL 0.5-2.5 (3) MG/3ML IN SOLN: 3.0000 mL | Freq: Four times a day (QID) | RESPIRATORY_TRACT | Status: DC | PRN

## 2019-06-07 MED ORDER — PANTOPRAZOLE SODIUM 40 MG IV SOLR
40.0000 mg | Freq: Every day | INTRAVENOUS | Status: DC
  Administered 2019-06-07 – 2019-06-13 (×7): 40 mg via INTRAVENOUS
  Filled 2019-06-07 (×7): qty 40

## 2019-06-07 MED ORDER — CEFAZOLIN SODIUM-DEXTROSE 2-4 GM/100ML-% IV SOLN
2.0000 g | Freq: Three times a day (TID) | INTRAVENOUS | Status: DC
  Administered 2019-06-07 – 2019-06-11 (×12): 2 g via INTRAVENOUS
  Filled 2019-06-07 (×12): qty 100

## 2019-06-07 MED ORDER — FENTANYL CITRATE (PF) 100 MCG/2ML IJ SOLN
INTRAMUSCULAR | Status: DC | PRN
  Administered 2019-06-07: 50 ug via INTRAVENOUS
  Administered 2019-06-07 (×2): 100 ug via INTRAVENOUS

## 2019-06-07 MED ORDER — MIDAZOLAM 50MG/50ML (1MG/ML) PREMIX INFUSION
0.5000 mg/h | INTRAVENOUS | Status: DC
  Filled 2019-06-07: qty 50

## 2019-06-07 MED ORDER — ONDANSETRON HCL 4 MG/2ML IJ SOLN
INTRAMUSCULAR | Status: AC
  Filled 2019-06-07: qty 2

## 2019-06-07 MED ORDER — LIDOCAINE 2% (20 MG/ML) 5 ML SYRINGE
INTRAMUSCULAR | Status: AC
  Filled 2019-06-07: qty 5

## 2019-06-07 MED ORDER — DEXAMETHASONE SODIUM PHOSPHATE 10 MG/ML IJ SOLN
INTRAMUSCULAR | Status: AC
  Filled 2019-06-07: qty 1

## 2019-06-07 MED ORDER — ONDANSETRON HCL 4 MG/2ML IJ SOLN
INTRAMUSCULAR | Status: DC | PRN
  Administered 2019-06-07: 4 mg via INTRAVENOUS

## 2019-06-07 MED ORDER — LACTATED RINGERS IV SOLN: INTRAVENOUS | Status: DC | PRN

## 2019-06-07 MED ORDER — DEXAMETHASONE SODIUM PHOSPHATE 10 MG/ML IJ SOLN
INTRAMUSCULAR | Status: DC | PRN
  Administered 2019-06-07: 10 mg via INTRAVENOUS

## 2019-06-07 MED ORDER — IPRATROPIUM-ALBUTEROL 0.5-2.5 (3) MG/3ML IN SOLN
RESPIRATORY_TRACT | Status: AC
  Administered 2019-06-07: 3 mL via RESPIRATORY_TRACT
  Filled 2019-06-07: qty 3

## 2019-06-07 SURGICAL SUPPLY — 33 items
BLADE CLIPPER SURG (BLADE) IMPLANT
CANISTER SUCT 3000ML PPV (MISCELLANEOUS) ×2 IMPLANT
COVER SURGICAL LIGHT HANDLE (MISCELLANEOUS) ×2 IMPLANT
COVER WAND RF STERILE (DRAPES) ×2 IMPLANT
DRAPE UTILITY XL STRL (DRAPES) ×2 IMPLANT
DRSG DRAWTEX TRACH 4X4 (GAUZE/BANDAGES/DRESSINGS) ×2 IMPLANT
ELECT CAUTERY BLADE 6.4 (BLADE) ×2 IMPLANT
ELECT REM PT RETURN 9FT ADLT (ELECTROSURGICAL) ×2
ELECTRODE REM PT RTRN 9FT ADLT (ELECTROSURGICAL) ×1 IMPLANT
GAUZE 4X4 16PLY RFD (DISPOSABLE) ×2 IMPLANT
GLOVE BIO SURGEON STRL SZ8 (GLOVE) ×2 IMPLANT
GLOVE BIOGEL PI IND STRL 8 (GLOVE) ×1 IMPLANT
GLOVE BIOGEL PI INDICATOR 8 (GLOVE) ×1
GOWN STRL REUS W/ TWL LRG LVL3 (GOWN DISPOSABLE) ×1 IMPLANT
GOWN STRL REUS W/ TWL XL LVL3 (GOWN DISPOSABLE) ×1 IMPLANT
GOWN STRL REUS W/TWL LRG LVL3 (GOWN DISPOSABLE) ×1
GOWN STRL REUS W/TWL XL LVL3 (GOWN DISPOSABLE) ×1
INTRODUCER TRACH BLUE RHINO 6F (TUBING) ×2 IMPLANT
INTRODUCER TRACH BLUE RHINO 8F (TUBING) IMPLANT
KIT BASIN OR (CUSTOM PROCEDURE TRAY) ×2 IMPLANT
KIT TURNOVER KIT B (KITS) ×2 IMPLANT
NS IRRIG 1000ML POUR BTL (IV SOLUTION) ×2 IMPLANT
PACK EENT II TURBAN DRAPE (CUSTOM PROCEDURE TRAY) ×2 IMPLANT
PAD ARMBOARD 7.5X6 YLW CONV (MISCELLANEOUS) ×4 IMPLANT
PENCIL BUTTON HOLSTER BLD 10FT (ELECTRODE) ×2 IMPLANT
SPONGE INTESTINAL PEANUT (DISPOSABLE) ×2 IMPLANT
SUT SILK 2 0 PERMA HAND 18 BK (SUTURE) ×2 IMPLANT
SUT SILK 2 0SH CR/8 30 (SUTURE) ×2 IMPLANT
SUT VICRYL AB 3 0 TIES (SUTURE) ×2 IMPLANT
SYR 20ML LL LF (SYRINGE) ×2 IMPLANT
TOWEL GREEN STERILE (TOWEL DISPOSABLE) ×2 IMPLANT
TOWEL GREEN STERILE FF (TOWEL DISPOSABLE) ×2 IMPLANT
TUBE CONNECTING 12X1/4 (SUCTIONS) ×2 IMPLANT
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
## 2019-06-07 NOTE — Addendum Note (Signed)
Addendum  created 06/07/19 2103 by Duane Boston, MD   Intraprocedure Event edited

## 2019-06-07 NOTE — Progress Notes (Signed)
Chaplain responded to a code blue for this patient.  No family present.  Chaplain obtained information and coordinated with MD to call spouse.  MD updated spouse.  Spouse plans on coming in the morning.  Chaplain will pass on to day Chaplain to check on family.  No other needs identified at this time.  Chaplain available as needed. [place]kin, MDiv.     06/07/19 1958  Clinical Encounter Type  Visited With Health care provider  Visit Type Code  Referral From Nurse  Consult/Referral To Chaplain

## 2019-06-07 NOTE — Progress Notes (Signed)
Patient had not voided all of shift. RN bladder scanned patient and got 550 cc in bladder. Dr. Donne Hazel paged and he said that a temp foley was supposed to be put in down in the ER at about 1500 on 5/27. RN put temp foley in and got 900 cc urine. Foley site clean, dry, and intact. Will continue to monitor patient and urine output.
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
## 2019-06-07 NOTE — Procedures (Signed)
Pt was emergently bronched d/t lost of VS  Initial bronchoscopy via trach ended with the soft tissue at the end of the trach.  This confirmed that the trach was dislodged.  Pt with clear airway and min bloody mucus in trachea down to carina. Both right and legf bronchus were clear. Pt tolerated the procedure well  Pt was placed back on the vent  CXR pending

## 2019-06-07 NOTE — Anesthesia Postprocedure Evaluation (Signed)
Anesthesia Post Note  Patient: Chase Caldwell  Procedure(s) Performed: TRACHEOSTOMY (N/A Neck)     Patient location during evaluation: SICU Anesthesia Type: General Level of consciousness: sedated Pain management: pain level controlled Vital Signs Assessment: post-procedure vital signs reviewed and stable Respiratory status: patient remains intubated per anesthesia plan Cardiovascular status: stable Postop Assessment: no apparent nausea or vomiting Anesthetic complications: no    Last Vitals:  Vitals:   06/07/19 1530 06/07/19 1535  BP: (!) 154/101 (!) 136/92  Pulse: 100 99  Resp: 18 18  Temp:  37 C  SpO2: 97% 96%    Last Pain:  Vitals:   06/07/19 0700  TempSrc: Bladder  PainSc:                  Effie Berkshire

## 2019-06-07 NOTE — Progress Notes (Signed)
Unable to reach family, will plan to move forward with trach using emergent indications as without tracheostomy placement, the patient would be unable to undergo surgical management of his open facial fractures.   Jesusita Oka, MD General and [place] [facility]

## 2019-06-07 NOTE — Procedures (Signed)
Indication procedure: Patient was found to have bilateral pneumothoraces on post bronchoscopy chest x-ray.  Details of procedure: Patient underwent bilateral pigtail catheter placement via Seldinger technique.  There is large amount of air rush bilaterally.  These were secured to the skin using a 3-0 silk that was provided in the kit.  The patient tired procedure well.  Post procedure O2 sat was 98%.  Chest x-ray pending.
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
## 2019-06-07 NOTE — Progress Notes (Signed)
1905 this RN was completing the initial shift assessment and attempting to identify neuro status and alertness. He responded to his name and was attempting to talk. I increased his sedation however he was still attempting to fight and sit up.   I could hear the patient talking around the trach and the exchange of air through his mouth, RT was immediately called.   Once he started desaturating into the low 90s-80's, RT was called again to request their assistance immediately. I began bagging via trach and an extremely large blood clot had come out.   Patient desaturated down into the 30's, respiratory took over bagging and changed the inner cannula. He was extremely difficult to bag and had a 02 saturation of 0 with cyanosis.   He went into PEA and CPR was started- the code was run by Dr.Ramirez; we were able to resuscitate the patient, re-intubate and stabilize.   His saturations weren't coming up and an xray was completed and showed bilateral pneumothoraces and therefore bilateral chest tubes were placed. His ETT tube cuff also ruptured and anesthesia assisted in a tube exchange.   Family notified and will continue to monitor.  Leticia Clas, RN BSN, [place]

## 2019-06-07 NOTE — Plan of Care (Signed)
  Problem: Education: Goal: Knowledge of General Education information will improve Description: Including pain rating scale, medication(s)/side effects and non-pharmacologic comfort measures Outcome: Progressing   Problem: Clinical Measurements: Goal: Ability to maintain clinical measurements within normal limits will improve Outcome: Progressing   Problem: Clinical Measurements: Goal: Will remain free from infection Outcome: Progressing   Problem: Clinical Measurements: Goal: Respiratory complications will improve Outcome: Progressing   Problem: Clinical Measurements: Goal: Cardiovascular complication will be avoided Outcome: Progressing

## 2019-06-07 NOTE — TOC CAGE-AID Note (Signed)
Transition of Care [facility]) - CAGE-AID Screening   Patient Details  Name: Chase Caldwell MRN: [id number] Date of Birth: 1980-04-25  Transition of Care [facility]) CM/SW Contact:    Emeterio Reeve, [place] Phone Number: 06/07/2019, 10:40 AM   Clinical Narrative: Pt unable to complete assessment due to pt being on vent.    CAGE-AID Screening: Substance Abuse Screening unable to be completed due to: : Patient unable to participate               Providence Crosby Clinical Social Worker [phone]
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
## 2019-06-07 NOTE — Anesthesia Preprocedure Evaluation (Addendum)
Anesthesia Evaluation   Patient unresponsive    Reviewed: Allergy & Precautions, [name] Chart, lab work & pertinent test results, Unable to perform ROS - Chart review onlyPreop documentation limited or incomplete due to emergent nature of procedure.  Airway Mallampati: Intubated       Dental   Unable to assess to due bandages:   Pulmonary neg pulmonary ROS,     + decreased breath sounds      Cardiovascular negative cardio ROS Normal cardiovascular exam     Neuro/Psych negative neurological ROS  negative psych ROS   GI/Hepatic negative GI ROS, Neg liver ROS,   Endo/Other  negative endocrine ROS  Renal/GU negative Renal ROS     Musculoskeletal negative musculoskeletal ROS (+)   Abdominal Normal abdominal exam  (+)   Peds  Hematology negative hematology ROS (+)   Anesthesia Other Findings   Reproductive/Obstetrics                            Anesthesia Physical Anesthesia Plan  ASA: III  Anesthesia Plan: General   Post-op Pain Management:    Induction: Inhalational  PONV Risk Score and Plan: 3 and Ondansetron, Treatment may vary due to age or medical condition and Midazolam  Airway Management Planned: Oral ETT and Tracheostomy  Additional Equipment: None  Intra-op Plan:   Post-operative Plan: Post-operative intubation/ventilation  Informed Consent:     Only emergency history available and History available from chart only  Plan Discussed with: CRNA  Anesthesia Plan Comments:        Anesthesia Quick Evaluation

## 2019-06-07 NOTE — Progress Notes (Signed)
Clinical update provided to the wife, including trach placement today. Notified of surgical plan by OMFS 5/30. All questions answered.   Jesusita Oka, MD General and [place] [facility]

## 2019-06-07 NOTE — Anesthesia Procedure Notes (Addendum)
Procedure Name: Intubation Date/Time: 06/07/2019 7:15 PM Performed by: Josephine Igo, CRNA Pre-anesthesia Checklist: Suction available and Patient being monitored Oxygen Delivery Method: Ambu bag Tube type: Oral Tube size: 7.5 mm Number of attempts: 2 Airway Equipment and Method: Stylet and Video-laryngoscopy Placement Confirmation: ETT inserted through vocal cords under direct vision and CO2 detector Secured at: 24 cm Tube secured with: Tape Dental Injury: Teeth and Oropharynx as per pre-operative assessment  Difficulty Due To: Difficulty was anticipated Comments: Called to 4N. CODE BLUE in process. RT ventilating with AMBU. Significant edema of face and tongue. S/p tracheotomy today.Airway bloody.  Video laryngoscopy x1 Gabreille Dardis CRNA - esophageal intubation- recognized immediately. Video laryngoscopy x1 Singer MD-> intubated ETT 7.5; +cuff ETCO2

## 2019-06-07 NOTE — Progress Notes (Addendum)
ETT changed out by Anesthesiologist due to blown cuff. 7.5 tube exchanged over a bougie to a 8.0 tube. Color change on EZ cap. Bilateral breath sounds.

## 2019-06-07 NOTE — Transfer of Care (Addendum)
Immediate Anesthesia Transfer of Care Note  Patient: Chase Caldwell  Procedure(s) Performed: TRACHEOSTOMY (N/A Neck)  Patient Location: ICU  Anesthesia Type:General  Level of Consciousness: sedated  Airway & Oxygen Therapy: Patient remains intubated per anesthesia plan and Patient placed on Ventilator (see vital sign flow sheet for setting)  Post-op Assessment: Report given to RN and Post -op Vital signs reviewed and stable  Post vital signs: Reviewed and stable  Last Vitals:  Vitals Value Taken Time  BP 136/92 06/07/19 1536  Temp 36.9 C 06/07/19 1538  Pulse 98 06/07/19 1538  Resp 18 06/07/19 1538  SpO2 96 % 06/07/19 1538  Vitals shown include unvalidated device data.  Last Pain:  Vitals:   06/07/19 0700  TempSrc: Bladder  PainSc:          Complications: No apparent anesthesia complications

## 2019-06-08 ENCOUNTER — Inpatient Hospital Stay (HOSPITAL_COMMUNITY): Payer: Medicaid Other

## 2019-06-08 LAB — POCT I-STAT 7, (LYTES, BLD GAS, ICA,H+H)
Acid-Base Excess: 4 mmol/L — ABNORMAL HIGH (ref 0.0–2.0)
Bicarbonate: 28.4 mmol/L — ABNORMAL HIGH (ref 20.0–28.0)
Calcium, Ion: 1.12 mmol/L — ABNORMAL LOW (ref 1.15–1.40)
HCT: 37 % — ABNORMAL LOW (ref 39.0–52.0)
Hemoglobin: 12.6 g/dL — ABNORMAL LOW (ref 13.0–17.0)
O2 Saturation: 99 %
Patient temperature: 37.4
Potassium: 4.3 mmol/L (ref 3.5–5.1)
Sodium: 139 mmol/L (ref 135–145)
TCO2: 30 mmol/L (ref 22–32)
pCO2 arterial: 40.8 mmHg (ref 32.0–48.0)
pH, Arterial: 7.451 — ABNORMAL HIGH (ref 7.350–7.450)
pO2, Arterial: 139 mmHg — ABNORMAL HIGH (ref 83.0–108.0)

## 2019-06-08 LAB — GLUCOSE, CAPILLARY
Glucose-Capillary: 160 mg/dL — ABNORMAL HIGH (ref 70–99)
Glucose-Capillary: 147 mg/dL — ABNORMAL HIGH (ref 70–99)
Glucose-Capillary: 152 mg/dL — ABNORMAL HIGH (ref 70–99)
Glucose-Capillary: 144 mg/dL — ABNORMAL HIGH (ref 70–99)
Glucose-Capillary: 135 mg/dL — ABNORMAL HIGH (ref 70–99)

## 2019-06-08 LAB — TRIGLYCERIDES: Triglycerides: 162 mg/dL — ABNORMAL HIGH (ref <150)

## 2019-06-08 MED ORDER — LABETALOL HCL 5 MG/ML IV SOLN
10.0000 mg | INTRAVENOUS | Status: DC | PRN
  Administered 2019-06-08 – 2019-06-10 (×4): 20 mg via INTRAVENOUS
  Administered 2019-06-11: 10 mg via INTRAVENOUS
  Administered 2019-06-12 – 2019-06-13 (×3): 20 mg via INTRAVENOUS
  Administered 2019-06-14: 10 mg via INTRAVENOUS
  Administered 2019-06-15 – 2019-06-19 (×4): 20 mg via INTRAVENOUS
  Administered 2019-06-19: 10 mg via INTRAVENOUS
  Administered 2019-06-19: 20 mg via INTRAVENOUS
  Administered 2019-06-20: 10 mg via INTRAVENOUS
  Administered 2019-06-21 – 2019-06-25 (×3): 20 mg via INTRAVENOUS
  Filled 2019-06-08 (×20): qty 4

## 2019-06-08 MED FILL — Medication: Qty: 1 | Status: AC

## 2019-06-08 NOTE — Progress Notes (Signed)
1950- MD paged about patient's SBP>160. Hydralazine PRN is already ordered but patient is also tachycardic. New verbal order for PRN labetalol 10-56m.   2100- Found patient to be voiding around the temperature probe foley catheter. Attempted to flush with a 10cc normal saline syringe through the port and met resistance. Bladder scanned patient and found [id number] in bladder. The temp. probe foley was remove and a new 16 french foley was placed. There was crystallized sediment on the end of the old foley catheter. [id number] urine dumped into the new catheter right away.
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
## 2019-06-09 ENCOUNTER — Inpatient Hospital Stay (HOSPITAL_COMMUNITY): Payer: Medicaid Other

## 2019-06-09 ENCOUNTER — Inpatient Hospital Stay (HOSPITAL_COMMUNITY): Payer: Medicaid Other | Admitting: Anesthesiology

## 2019-06-09 ENCOUNTER — Encounter (HOSPITAL_COMMUNITY): Admission: EM | Disposition: A | Discharge status: Rehabilitation Facility | Payer: Self-pay | Source: Home / Self Care

## 2019-06-09 HISTORY — PX: DEBRIDEMENT AND CLOSURE WOUND: SHX5614

## 2019-06-09 HISTORY — PX: TRACHEOSTOMY TUBE PLACEMENT: SHX814

## 2019-06-09 LAB — GLUCOSE, CAPILLARY
Glucose-Capillary: 145 mg/dL — ABNORMAL HIGH (ref 70–99)
Glucose-Capillary: 123 mg/dL — ABNORMAL HIGH (ref 70–99)
Glucose-Capillary: 134 mg/dL — ABNORMAL HIGH (ref 70–99)
Glucose-Capillary: 114 mg/dL — ABNORMAL HIGH (ref 70–99)
Glucose-Capillary: 146 mg/dL — ABNORMAL HIGH (ref 70–99)

## 2019-06-09 LAB — TRIGLYCERIDES: Triglycerides: 429 mg/dL — ABNORMAL HIGH (ref <150)

## 2019-06-09 SURGERY — CREATION, TRACHEOSTOMY
Anesthesia: General | Site: Face

## 2019-06-09 MED ORDER — SODIUM CHLORIDE 0.9 % IV SOLN: INTRAVENOUS | Status: DC | PRN

## 2019-06-09 MED ORDER — LACTATED RINGERS IV SOLN: INTRAVENOUS | Status: DC | PRN

## 2019-06-09 MED ORDER — 0.9 % SODIUM CHLORIDE (POUR BTL) OPTIME
TOPICAL | Status: DC | PRN
  Administered 2019-06-09: 1000 mL

## 2019-06-09 MED ORDER — SODIUM CHLORIDE 0.9 % IV SOLN
INTRAVENOUS | Status: DC | PRN
  Administered 2019-06-09: 500 mL

## 2019-06-09 MED ORDER — BACITRACIN ZINC 500 UNIT/GM EX OINT
TOPICAL_OINTMENT | CUTANEOUS | Status: AC
  Filled 2019-06-09: qty 28.35

## 2019-06-09 MED ORDER — ROCURONIUM BROMIDE 10 MG/ML (PF) SYRINGE
PREFILLED_SYRINGE | INTRAVENOUS | Status: DC | PRN
  Administered 2019-06-09: 20 mg via INTRAVENOUS
  Administered 2019-06-09: 50 mg via INTRAVENOUS

## 2019-06-09 MED ORDER — SODIUM CHLORIDE 0.9 % IV SOLN
INTRAVENOUS | Status: AC
  Filled 2019-06-09: qty 500000

## 2019-06-09 MED ORDER — ONDANSETRON HCL 4 MG/2ML IJ SOLN
INTRAMUSCULAR | Status: DC | PRN
  Administered 2019-06-09: 4 mg via INTRAVENOUS

## 2019-06-09 MED ORDER — HEMOSTATIC AGENTS (NO CHARGE) OPTIME
TOPICAL | Status: DC | PRN
  Administered 2019-06-09: 1 via TOPICAL

## 2019-06-09 MED ORDER — ROCURONIUM BROMIDE 10 MG/ML (PF) SYRINGE
PREFILLED_SYRINGE | INTRAVENOUS | Status: AC
  Filled 2019-06-09: qty 20

## 2019-06-09 MED ORDER — LIDOCAINE-EPINEPHRINE 1 %-1:100000 IJ SOLN
INTRAMUSCULAR | Status: DC | PRN
  Administered 2019-06-09: 15 mL
  Administered 2019-06-09: 6 mL

## 2019-06-09 MED ORDER — PROPOFOL 500 MG/50ML IV EMUL
INTRAVENOUS | Status: DC | PRN
Start: 2019-06-09 — End: 2019-06-09
  Administered 2019-06-09: 50 ug/kg/min via INTRAVENOUS

## 2019-06-09 MED ORDER — MIDAZOLAM HCL 2 MG/2ML IJ SOLN
INTRAMUSCULAR | Status: AC
  Filled 2019-06-09: qty 2

## 2019-06-09 MED ORDER — LIDOCAINE-EPINEPHRINE 1 %-1:100000 IJ SOLN
INTRAMUSCULAR | Status: AC
  Filled 2019-06-09: qty 1

## 2019-06-09 MED ORDER — SODIUM CHLORIDE 0.9 % IV SOLN
INTRAVENOUS | Status: DC | PRN
  Administered 2019-06-09: 200 ug/h via INTRAVENOUS

## 2019-06-09 MED ORDER — ONDANSETRON HCL 4 MG/2ML IJ SOLN
INTRAMUSCULAR | Status: AC
  Filled 2019-06-09: qty 2

## 2019-06-09 MED ORDER — PROPOFOL 10 MG/ML IV BOLUS
INTRAVENOUS | Status: AC
  Filled 2019-06-09: qty 20

## 2019-06-09 MED ORDER — FENTANYL CITRATE (PF) 250 MCG/5ML IJ SOLN
INTRAMUSCULAR | Status: AC
  Filled 2019-06-09: qty 5

## 2019-06-09 SURGICAL SUPPLY — 59 items
BENZOIN TINCTURE PRP APPL 2/3 (GAUZE/BANDAGES/DRESSINGS) ×1 IMPLANT
BLADE CLIPPER SURG (BLADE) IMPLANT
BLADE SURG 15 STRL LF DISP TIS (BLADE) IMPLANT
BLADE SURG 15 STRL SS (BLADE)
BNDG COHESIVE 2X5 TAN STRL LF (GAUZE/BANDAGES/DRESSINGS) ×1 IMPLANT
BNDG GAUZE ELAST 4 BULKY (GAUZE/BANDAGES/DRESSINGS) ×1 IMPLANT
CANISTER SUCT 3000ML PPV (MISCELLANEOUS) ×3 IMPLANT
CLEANER TIP ELECTROSURG 2X2 (MISCELLANEOUS) ×4 IMPLANT
COVER SURGICAL LIGHT HANDLE (MISCELLANEOUS) ×3 IMPLANT
COVER WAND RF STERILE (DRAPES) ×2 IMPLANT
DECANTER SPIKE VIAL GLASS SM (MISCELLANEOUS) ×3 IMPLANT
DRAIN PENROSE 1/4X12 LTX STRL (WOUND CARE) ×1 IMPLANT
DRAPE HALF SHEET 40X57 (DRAPES) IMPLANT
DRESSING ALLEVYN LITE 5X5 NADH (GAUZE/BANDAGES/DRESSINGS) IMPLANT
DRESSING NASAL POPE 10X1.5X2.5 (GAUZE/BANDAGES/DRESSINGS) IMPLANT
DRSG ALLEVYN LITE 5X5 NADH (GAUZE/BANDAGES/DRESSINGS) ×3
DRSG DRAWTEX TRACH 4X4 (GAUZE/BANDAGES/DRESSINGS) ×1 IMPLANT
DRSG NASAL POPE 10X1.5X2.5 (GAUZE/BANDAGES/DRESSINGS) ×6
DRSG TELFA 3X8 NADH (GAUZE/BANDAGES/DRESSINGS) ×3 IMPLANT
ELECT COATED BLADE 2.86 ST (ELECTRODE) ×3 IMPLANT
ELECT REM PT RETURN 9FT ADLT (ELECTROSURGICAL) ×3
ELECTRODE REM PT RTRN 9FT ADLT (ELECTROSURGICAL) ×2 IMPLANT
GAUZE 4X4 16PLY RFD (DISPOSABLE) IMPLANT
GAUZE XEROFORM 5X9 LF (GAUZE/BANDAGES/DRESSINGS) IMPLANT
GLOVE ECLIPSE 7.5 STRL STRAW (GLOVE) ×3 IMPLANT
GOWN STRL REUS W/ TWL LRG LVL3 (GOWN DISPOSABLE) ×6 IMPLANT
GOWN STRL REUS W/TWL LRG LVL3 (GOWN DISPOSABLE) ×3
HOLDER TRACH TUBE VELCRO 19.5 (MISCELLANEOUS) ×1 IMPLANT
KIT BASIN OR (CUSTOM PROCEDURE TRAY) ×3 IMPLANT
KIT SPLINT NASAL DENVER SM BEI (GAUZE/BANDAGES/DRESSINGS) ×1 IMPLANT
KIT SUCTION CATH 14FR (SUCTIONS) IMPLANT
KIT TURNOVER KIT B (KITS) ×3 IMPLANT
NDL HYPO 25GX1X1/2 BEV (NEEDLE) ×2 IMPLANT
NEEDLE HYPO 25GX1X1/2 BEV (NEEDLE) ×3 IMPLANT
NS IRRIG 1000ML POUR BTL (IV SOLUTION) ×3 IMPLANT
PAD ARMBOARD 7.5X6 YLW CONV (MISCELLANEOUS) ×6 IMPLANT
PAD DRESSING TELFA 3X8 NADH (GAUZE/BANDAGES/DRESSINGS) IMPLANT
PENCIL BUTTON HOLSTER BLD 10FT (ELECTRODE) ×1 IMPLANT
SPONGE INTESTINAL PEANUT (DISPOSABLE) ×3 IMPLANT
SPONGE LAP 18X18 RF (DISPOSABLE) ×2 IMPLANT
SPONGE SURGIFOAM ABS GEL 12-7 (HEMOSTASIS) ×1 IMPLANT
SUT CHROMIC 3 0 SH 27 (SUTURE) IMPLANT
SUT CHROMIC 4 0 P 3 18 (SUTURE) ×1 IMPLANT
SUT MNCRL AB 3-0 PS2 18 (SUTURE) ×4 IMPLANT
SUT PLAIN 4 0 FS 2 27 (SUTURE) ×1 IMPLANT
SUT PLAIN GUT FAST 5-0 (SUTURE) ×1 IMPLANT
SUT PROLENE 2 0 SH DA (SUTURE) ×3 IMPLANT
SUT PROLENE 5 0 PS 2 (SUTURE) ×1 IMPLANT
SUT SILK 3 0 (SUTURE) ×1
SUT SILK 3-0 18XBRD TIE 12 (SUTURE) ×2 IMPLANT
SYR 20ML LL LF (SYRINGE) IMPLANT
SYR BULB EAR ULCER 3OZ GRN STR (SYRINGE) IMPLANT
SYR CONTROL 10ML LL (SYRINGE) IMPLANT
TOWEL GREEN STERILE FF (TOWEL DISPOSABLE) ×3 IMPLANT
TRAY ENT MC OR (CUSTOM PROCEDURE TRAY) ×3 IMPLANT
TUBE CONNECTING 12X1/4 (SUCTIONS) ×3 IMPLANT
TUBE TRACH 7.0 EXL PROX CUF (TUBING) ×1 IMPLANT
TUBE TRACH SHILEY 8 DIST CUF (TUBING) IMPLANT
WATER STERILE IRR 1000ML POUR (IV SOLUTION) ×3 IMPLANT

## 2019-06-09 NOTE — Progress Notes (Signed)
RT note- transported patient to CT and now back in room, remains on current ventilator setting.
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
## 2019-06-09 NOTE — Progress Notes (Signed)
Assisted tele visit to patient with family member.  Ting Cage M, RN  
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
## 2019-06-09 NOTE — Progress Notes (Signed)
RT called to patient's bedside due to cuff leak, cuff inflated. Patient is receiving his tidal volumes, vitals are stable. RT will continue to monitor .
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
## 2019-06-09 NOTE — Transfer of Care (Signed)
Immediate Anesthesia Transfer of Care Note  Patient: Chase Caldwell  Procedure(s) Performed: TRACHEOSTOMY REVISION (N/A ) DEBRIDEMENT AND CLOSURE WOUND (N/A Face)  Patient Location: ICU  Anesthesia Type:General  Level of Consciousness: sedated, unresponsive and Patient remains intubated per anesthesia plan  Airway & Oxygen Therapy: Patient placed on Ventilator (see vital sign flow sheet for setting)  Post-op Assessment: Report given to RN and Post -op Vital signs reviewed and stable  Post vital signs: Reviewed and stable  Last Vitals:  Vitals Value Taken Time  BP 129/77 06/09/19 1039  Temp    Pulse 109 06/09/19 1044  Resp 26 06/09/19 1044  SpO2 97 % 06/09/19 1044  Vitals shown include unvalidated device data.  Last Pain:  Vitals:   06/09/19 0400  TempSrc: Axillary  PainSc:          Complications: No apparent anesthesia complications

## 2019-06-10 ENCOUNTER — Encounter: Payer: Self-pay | Admitting: *Deleted

## 2019-06-10 ENCOUNTER — Inpatient Hospital Stay (HOSPITAL_COMMUNITY): Payer: Medicaid Other

## 2019-06-10 ENCOUNTER — Other Ambulatory Visit: Payer: Self-pay

## 2019-06-10 LAB — GLUCOSE, CAPILLARY
Glucose-Capillary: 123 mg/dL — ABNORMAL HIGH (ref 70–99)
Glucose-Capillary: 125 mg/dL — ABNORMAL HIGH (ref 70–99)
Glucose-Capillary: 128 mg/dL — ABNORMAL HIGH (ref 70–99)
Glucose-Capillary: 132 mg/dL — ABNORMAL HIGH (ref 70–99)
Glucose-Capillary: 136 mg/dL — ABNORMAL HIGH (ref 70–99)
Glucose-Capillary: 139 mg/dL — ABNORMAL HIGH (ref 70–99)

## 2019-06-10 LAB — PHOSPHORUS
Phosphorus: 1.6 mg/dL — ABNORMAL LOW (ref 2.5–4.6)
Phosphorus: 1.8 mg/dL — ABNORMAL LOW (ref 2.5–4.6)

## 2019-06-10 LAB — MAGNESIUM
Magnesium: 2.5 mg/dL — ABNORMAL HIGH (ref 1.7–2.4)
Magnesium: 2.6 mg/dL — ABNORMAL HIGH (ref 1.7–2.4)

## 2019-06-10 LAB — TRIGLYCERIDES: Triglycerides: 234 mg/dL — ABNORMAL HIGH (ref <150)

## 2019-06-10 MED ORDER — INSULIN ASPART 100 UNIT/ML ~~LOC~~ SOLN: 0.0000 [IU] | SUBCUTANEOUS | Status: DC

## 2019-06-10 MED ORDER — PRO-STAT SUGAR FREE PO LIQD: 30.0000 mL | Freq: Two times a day (BID) | ORAL | Status: DC

## 2019-06-10 MED ORDER — INSULIN ASPART 100 UNIT/ML ~~LOC~~ SOLN
0.0000 [IU] | SUBCUTANEOUS | Status: DC
  Administered 2019-06-10 – 2019-06-13 (×11): 2 [IU] via SUBCUTANEOUS
  Administered 2019-06-13: 1 [IU] via SUBCUTANEOUS
  Administered 2019-06-13 – 2019-06-14 (×5): 2 [IU] via SUBCUTANEOUS
  Administered 2019-06-14: 3 [IU] via SUBCUTANEOUS
  Administered 2019-06-14 – 2019-06-17 (×15): 2 [IU] via SUBCUTANEOUS
  Administered 2019-06-18: 3 [IU] via SUBCUTANEOUS
  Administered 2019-06-18: 2 [IU] via SUBCUTANEOUS
  Administered 2019-06-18: 3 [IU] via SUBCUTANEOUS
  Administered 2019-06-18: 2 [IU] via SUBCUTANEOUS
  Administered 2019-06-18: 3 [IU] via SUBCUTANEOUS
  Administered 2019-06-19: 2 [IU] via SUBCUTANEOUS
  Administered 2019-06-19: 3 [IU] via SUBCUTANEOUS
  Administered 2019-06-19: 2 [IU] via SUBCUTANEOUS
  Administered 2019-06-19: 3 [IU] via SUBCUTANEOUS
  Administered 2019-06-19: 2 [IU] via SUBCUTANEOUS
  Administered 2019-06-19 – 2019-06-20 (×2): 3 [IU] via SUBCUTANEOUS
  Administered 2019-06-20: 2 [IU] via SUBCUTANEOUS
  Administered 2019-06-20: 3 [IU] via SUBCUTANEOUS
  Administered 2019-06-20 – 2019-06-21 (×3): 2 [IU] via SUBCUTANEOUS
  Administered 2019-06-21 (×3): 3 [IU] via SUBCUTANEOUS
  Administered 2019-06-21: 2 [IU] via SUBCUTANEOUS
  Administered 2019-06-21: 3 [IU] via SUBCUTANEOUS
  Administered 2019-06-22: 2 [IU] via SUBCUTANEOUS
  Administered 2019-06-22: 3 [IU] via SUBCUTANEOUS
  Administered 2019-06-22: 2 [IU] via SUBCUTANEOUS
  Administered 2019-06-22: 3 [IU] via SUBCUTANEOUS
  Administered 2019-06-22: 2 [IU] via SUBCUTANEOUS
  Administered 2019-06-22 – 2019-06-23 (×2): 3 [IU] via SUBCUTANEOUS
  Administered 2019-06-23 – 2019-06-26 (×12): 2 [IU] via SUBCUTANEOUS
  Administered 2019-06-27: 3 [IU] via SUBCUTANEOUS
  Administered 2019-06-27 (×3): 2 [IU] via SUBCUTANEOUS
  Administered 2019-06-27: 3 [IU] via SUBCUTANEOUS
  Administered 2019-06-28 – 2019-07-13 (×32): 2 [IU] via SUBCUTANEOUS
  Administered 2019-07-15 (×2): 3 [IU] via SUBCUTANEOUS
  Administered 2019-07-17 – 2019-07-18 (×2): 2 [IU] via SUBCUTANEOUS

## 2019-06-10 MED ORDER — PIVOT 1.5 CAL PO LIQD
1000.0000 mL | ORAL | Status: DC
  Administered 2019-06-10: 1000 mL

## 2019-06-10 MED ORDER — VITAL HIGH PROTEIN PO LIQD: 1000.0000 mL | ORAL | Status: DC
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
## 2019-06-10 NOTE — Progress Notes (Signed)
Pt's parents allowed up to see patient for approx. 10 minutes as discussed with pt's wife and charge nurse. Explained visitation policy to parents and wife- all are in agreement to keep pt's wife Roland Earl and daughter Loma Sousa the two designated visitors and pt's parents will call for updates.
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
## 2019-06-10 NOTE — Progress Notes (Addendum)
Pt's clothes- shirt and shorts, silver ring given to wife to take home.

## 2019-06-11 LAB — BASIC METABOLIC PANEL
Anion gap: 8 (ref 5–15)
BUN: 19 mg/dL (ref 6–20)
CO2: 28 mmol/L (ref 22–32)
Calcium: 7.8 mg/dL — ABNORMAL LOW (ref 8.9–10.3)
Chloride: 110 mmol/L (ref 98–111)
Creatinine, Ser: 0.75 mg/dL (ref 0.61–1.24)
GFR calc Af Amer: >60 mL/min (ref >60)
GFR calc non Af Amer: >60 mL/min (ref >60)
Glucose, Bld: 120 mg/dL — ABNORMAL HIGH (ref 70–99)
Potassium: 3.2 mmol/L — ABNORMAL LOW (ref 3.5–5.1)
Sodium: 146 mmol/L — ABNORMAL HIGH (ref 135–145)

## 2019-06-11 LAB — GLUCOSE, CAPILLARY
Glucose-Capillary: 114 mg/dL — ABNORMAL HIGH (ref 70–99)
Glucose-Capillary: 118 mg/dL — ABNORMAL HIGH (ref 70–99)
Glucose-Capillary: 122 mg/dL — ABNORMAL HIGH (ref 70–99)
Glucose-Capillary: 124 mg/dL — ABNORMAL HIGH (ref 70–99)
Glucose-Capillary: 128 mg/dL — ABNORMAL HIGH (ref 70–99)
Glucose-Capillary: 130 mg/dL — ABNORMAL HIGH (ref 70–99)
Glucose-Capillary: 131 mg/dL — ABNORMAL HIGH (ref 70–99)

## 2019-06-11 LAB — MAGNESIUM
Magnesium: 2.5 mg/dL — ABNORMAL HIGH (ref 1.7–2.4)
Magnesium: 2.3 mg/dL (ref 1.7–2.4)

## 2019-06-11 LAB — CBC
HCT: 29.1 % — ABNORMAL LOW (ref 39.0–52.0)
Hemoglobin: 9.4 g/dL — ABNORMAL LOW (ref 13.0–17.0)
MCH: 31 pg (ref 26.0–34.0)
MCHC: 32.3 g/dL (ref 30.0–36.0)
MCV: 96 fL (ref 80.0–100.0)
Platelets: 196 10*3/uL (ref 150–400)
RBC: 3.03 MIL/uL — ABNORMAL LOW (ref 4.22–5.81)
RDW: 14.7 % (ref 11.5–15.5)
WBC: 9.8 10*3/uL (ref 4.0–10.5)
nRBC: 0.2 % (ref 0.0–0.2)

## 2019-06-11 LAB — TRIGLYCERIDES: Triglycerides: 157 mg/dL — ABNORMAL HIGH (ref <150)

## 2019-06-11 LAB — PHOSPHORUS
Phosphorus: 2 mg/dL — ABNORMAL LOW (ref 2.5–4.6)
Phosphorus: 2.1 mg/dL — ABNORMAL LOW (ref 2.5–4.6)

## 2019-06-11 MED ORDER — POTASSIUM CHLORIDE 20 MEQ PO PACK
40.0000 meq | PACK | Freq: Once | ORAL | Status: AC
  Administered 2019-06-11: 40 meq
  Filled 2019-06-11: qty 2

## 2019-06-11 MED ORDER — PIVOT 1.5 CAL PO LIQD
1000.0000 mL | ORAL | Status: DC
  Administered 2019-06-12 – 2019-06-14 (×2): 1000 mL
  Filled 2019-06-11: qty 1000

## 2019-06-11 MED ORDER — CHLORHEXIDINE GLUCONATE CLOTH 2 % EX PADS
6.0000 | MEDICATED_PAD | Freq: Every day | CUTANEOUS | Status: DC
  Administered 2019-06-12 – 2019-07-18 (×35): 6 via TOPICAL

## 2019-06-11 MED ORDER — ADULT MULTIVITAMIN W/MINERALS CH
1.0000 | ORAL_TABLET | Freq: Every day | ORAL | Status: DC
  Administered 2019-06-11 – 2019-07-16 (×34): 1
  Filled 2019-06-11 (×34): qty 1

## 2019-06-11 MED ORDER — CLONAZEPAM 0.5 MG PO TABS
0.5000 mg | ORAL_TABLET | Freq: Two times a day (BID) | ORAL | Status: DC
  Administered 2019-06-11 – 2019-06-24 (×28): 0.5 mg
  Filled 2019-06-11 (×28): qty 1

## 2019-06-11 MED ORDER — OXYCODONE HCL 5 MG/5ML PO SOLN
10.0000 mg | ORAL | Status: DC | PRN
  Administered 2019-06-11 (×2): 10 mg
  Filled 2019-06-11 (×3): qty 10

## 2019-06-11 MED ORDER — QUETIAPINE FUMARATE 25 MG PO TABS
50.0000 mg | ORAL_TABLET | Freq: Two times a day (BID) | ORAL | Status: DC
  Administered 2019-06-11 – 2019-06-13 (×5): 50 mg
  Filled 2019-06-11 (×5): qty 2

## 2019-06-11 MED ORDER — PRO-STAT SUGAR FREE PO LIQD
60.0000 mL | Freq: Two times a day (BID) | ORAL | Status: DC
  Administered 2019-06-11 – 2019-06-14 (×7): 60 mL
  Filled 2019-06-11 (×7): qty 60

## 2019-06-11 MED FILL — Medication: Qty: 1 | Status: AC
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
## 2019-06-11 NOTE — Plan of Care (Signed)
  Problem: Clinical Measurements: Goal: Cardiovascular complication will be avoided Outcome: Progressing   Problem: Nutrition: Goal: Adequate nutrition will be maintained Outcome: Progressing   Problem: Elimination: Goal: Will not experience complications related to urinary retention Outcome: Progressing   Problem: Pain Managment: Goal: General experience of comfort will improve Outcome: Progressing   Problem: Skin Integrity: Goal: Risk for impaired skin integrity will decrease Outcome: Progressing   Problem: Activity: Goal: Risk for activity intolerance will decrease Outcome: Not Progressing

## 2019-06-11 NOTE — Progress Notes (Signed)
Subjective:    Chase Caldwell is a 39 y.o. male now POD 2 s/p wound washout and debridement with closure of submental, forehead and intraoral soft tissue injuries.Patient continues to do well post-operatively per nursing. Dressings are being changed BID with application of Bacitracin. No acute events over night.    Objective:    BP 133/71   Pulse 84   Temp (!) 100.5 F (38.1 C)   Resp (!) 26   Ht 6' (1.829 m)   Wt (!) 140.5 kg   SpO2 96%   BMI 42.01 kg/m   Neuro- Withdraws to painful stimulus.  HEENT: Facial soft tissue injuries are dressed with dressing C/D/I. His right orbital ecchymosis has persisted, but has improved slightly. Denver splint is in place over nasal bridge with merocel packs in place and hemostatic bilaterally.Floor of mouth is soft and non-elevated.   CV: RRR  Pulm: Ventilated   Abdomen: Abdomen in non-distended  Extremities: WWP  Assessment:    Chase Caldwell is a 39 y.o. male now POD 2 s/p wound washout and debridement with closure of submental, forehead and intraoral soft tissue injuries.The facial fractures will require delayed closure 1-2 weeks pending resolution of his swelling. Will plan to schedule surgery once soft tissue edema has improved.    Injuries: - Right comminuted frontal sinus fracture (anterior table only) - Orbital fracture of Medial and Superior orbital wall - Comminuted Right nasal bone fracture  - Comminuted Septal Fracture - Maxillary fracture of the right anterior maxilla - Soft tissue injury of the submandibular region, right medial lid and right brow.     Plan:    - Drain removed from submental region this morning (5/31) - Please maintain merocel packings in the nasal passages for hemostasis and keep denver splint in place.  - Maintain packing in all soft tissue injuries- Please contact me if bleeding resumes [phone]). - Will plan for surgical treatment of all facial fractures once swelling improves (7-10 days) -  Please control blood pressure to minimize bleeding - Use Afrin to bilateral nares should nasal bleeding develop - Please continue antibiotics at this time.  - OK to discontinue Compression dressing starting Thursday 6/3 - Please change dressings BID (discussed with nursing)  - Apply Bacitracin  - Place non-stick telfa pad  - Re-wrap with kerlix  - Apply Coban or ACE wrap over kerlix for compression of soft tissue injuries

## 2019-06-12 ENCOUNTER — Inpatient Hospital Stay (HOSPITAL_COMMUNITY): Payer: Medicaid Other

## 2019-06-12 LAB — BASIC METABOLIC PANEL
Anion gap: 10 (ref 5–15)
BUN: 19 mg/dL (ref 6–20)
CO2: 27 mmol/L (ref 22–32)
Calcium: 7.9 mg/dL — ABNORMAL LOW (ref 8.9–10.3)
Chloride: 112 mmol/L — ABNORMAL HIGH (ref 98–111)
Creatinine, Ser: 0.76 mg/dL (ref 0.61–1.24)
GFR calc Af Amer: >60 mL/min (ref >60)
GFR calc non Af Amer: >60 mL/min (ref >60)
Glucose, Bld: 134 mg/dL — ABNORMAL HIGH (ref 70–99)
Potassium: 3.4 mmol/L — ABNORMAL LOW (ref 3.5–5.1)
Sodium: 149 mmol/L — ABNORMAL HIGH (ref 135–145)

## 2019-06-12 LAB — GLUCOSE, CAPILLARY
Glucose-Capillary: 111 mg/dL — ABNORMAL HIGH (ref 70–99)
Glucose-Capillary: 137 mg/dL — ABNORMAL HIGH (ref 70–99)
Glucose-Capillary: 122 mg/dL — ABNORMAL HIGH (ref 70–99)
Glucose-Capillary: 146 mg/dL — ABNORMAL HIGH (ref 70–99)
Glucose-Capillary: 108 mg/dL — ABNORMAL HIGH (ref 70–99)
Glucose-Capillary: 128 mg/dL — ABNORMAL HIGH (ref 70–99)

## 2019-06-12 LAB — TRIGLYCERIDES: Triglycerides: 166 mg/dL — ABNORMAL HIGH (ref <150)

## 2019-06-12 MED ORDER — ACETAMINOPHEN 160 MG/5ML PO SOLN
650.0000 mg | Freq: Four times a day (QID) | ORAL | Status: DC | PRN
  Administered 2019-06-12 – 2019-06-13 (×3): 650 mg
  Filled 2019-06-12 (×3): qty 20.3

## 2019-06-12 MED ORDER — FUROSEMIDE 10 MG/ML IJ SOLN
40.0000 mg | Freq: Once | INTRAMUSCULAR | Status: AC
  Administered 2019-06-12: 40 mg via INTRAVENOUS
  Filled 2019-06-12: qty 4

## 2019-06-12 MED ORDER — SODIUM CHLORIDE 0.9 % IV SOLN
3.0000 g | Freq: Four times a day (QID) | INTRAVENOUS | Status: DC
  Administered 2019-06-12 – 2019-06-14 (×7): 3 g via INTRAVENOUS
  Filled 2019-06-12: qty 3
  Filled 2019-06-12 (×2): qty 8
  Filled 2019-06-12: qty 3
  Filled 2019-06-12: qty 8
  Filled 2019-06-12 (×2): qty 3
  Filled 2019-06-12 (×2): qty 8
  Filled 2019-06-12: qty 3

## 2019-06-12 MED ORDER — POTASSIUM CHLORIDE 20 MEQ PO PACK
40.0000 meq | PACK | Freq: Two times a day (BID) | ORAL | Status: AC
  Administered 2019-06-12 (×2): 40 meq
  Filled 2019-06-12 (×2): qty 2

## 2019-06-12 MED ORDER — FREE WATER
200.0000 mL | Freq: Three times a day (TID) | Status: DC
  Administered 2019-06-12 – 2019-06-14 (×7): 200 mL

## 2019-06-12 MED ORDER — BISACODYL 10 MG RE SUPP: 10.0000 mg | Freq: Every day | RECTAL | Status: DC | PRN

## 2019-06-12 NOTE — Progress Notes (Signed)
Subjective:    Chase Caldwell is a 39 y.o. male now POD 3 s/p wound washout and debridement with closure of submental, forehead and intraoral soft tissue injuries. Dressings are being changed BID with application of Bacitracin per nursing. Patient has become febrile in the past 24 hours with Tmax of 38.4. Patient was transitioned to Unasyn. He has been somewhat tachycardic, but with stable pressures.    Objective:    BP (!) 147/80   Pulse (!) 108   Temp (!) 101.2 F (38.4 C) (Axillary)   Resp (!) 28   Ht 6' (1.829 m)   Wt (!) 141 kg   SpO2 97%   BMI 42.16 kg/m   General: Resting comfortably in bed.   HEENT: Facial soft tissue injuries are dressed with dressing C/D/I. His right orbital ecchymosis has persisted and appears slightly worse today. Denver splint is in place over nasal bridge with merocel packs in place and hemostatic bilaterally.Floor of mouth is soft and non-elevated. Neck incision is well approximated with sutures in place. No evidence of hematoma or wound break down.   CV: elevated rate on monitors   Pulm: Ventilated   Abdomen: Abdomen in non-distended  Extremities: WWP  Assessment:    Chase Caldwell is a 39 y.o. male now POD 3 s/p wound washout and debridement with closure of submental, forehead and intraoral soft tissue injuries. The patient increased swelling is consistent with expected post op inflammation peaking on day 3. His fever however are concerning. His surgical sites appear healthy and I do not believe they are the source of his fevers at this point. I recommend continued monitoring of his fever and clinical condition at this point.The facial fractures will require delayed closure 1-2 weeks pending resolution of his swelling. Will plan to schedule surgery once soft tissue edema has improved.    Injuries: - Right comminuted frontal sinus fracture (anterior table only) - Orbital fracture of Medial and Superior orbital wall - Comminuted Right nasal bone  fracture  - Comminuted Septal Fracture - Maxillary fracture of the right anterior maxilla - Soft tissue injury of the submandibular region, right medial lid and right brow.     Plan:    - Drain removed from submental region this morning (5/31) - Will monitor fevers, agree with initiating Unasyn, Please keep patient on antibiotics until packs are removed in surgery.  - Please maintain merocel packings in the nasal passages for hemostasis and keep denver splint in place.  - Maintain packing in all soft tissue injuries- Please contact me if bleeding resumes [phone]). - Will plan for surgical treatment of all facial fractures once swelling improves (7-10 days) - Please control blood pressure to minimize bleeding - Use Afrin to bilateral nares should nasal bleeding develop - Please continue antibiotics at this time.  - OK to discontinue Compression dressing at this point.  - Please change dressings BID (discussed with nursing)  - Apply Bacitracin  - Place non-stick telfa pad
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
## 2019-06-13 ENCOUNTER — Inpatient Hospital Stay (HOSPITAL_COMMUNITY): Payer: Medicaid Other

## 2019-06-13 LAB — CBC
HCT: 30.5 % — ABNORMAL LOW (ref 39.0–52.0)
Hemoglobin: 9.8 g/dL — ABNORMAL LOW (ref 13.0–17.0)
MCH: 31.2 pg (ref 26.0–34.0)
MCHC: 32.1 g/dL (ref 30.0–36.0)
MCV: 97.1 fL (ref 80.0–100.0)
Platelets: 306 10*3/uL (ref 150–400)
RBC: 3.14 MIL/uL — ABNORMAL LOW (ref 4.22–5.81)
RDW: 15 % (ref 11.5–15.5)
WBC: 11.7 10*3/uL — ABNORMAL HIGH (ref 4.0–10.5)
nRBC: 0 % (ref 0.0–0.2)

## 2019-06-13 LAB — BLOOD CULTURE ID PANEL (REFLEXED)

## 2019-06-13 LAB — BASIC METABOLIC PANEL
Anion gap: 10 (ref 5–15)
BUN: 23 mg/dL — ABNORMAL HIGH (ref 6–20)
CO2: 26 mmol/L (ref 22–32)
Calcium: 8.2 mg/dL — ABNORMAL LOW (ref 8.9–10.3)
Chloride: 111 mmol/L (ref 98–111)
Creatinine, Ser: 0.77 mg/dL (ref 0.61–1.24)
GFR calc Af Amer: >60 mL/min (ref >60)
GFR calc non Af Amer: >60 mL/min (ref >60)
Glucose, Bld: 137 mg/dL — ABNORMAL HIGH (ref 70–99)
Potassium: 3.9 mmol/L (ref 3.5–5.1)
Sodium: 147 mmol/L — ABNORMAL HIGH (ref 135–145)

## 2019-06-13 LAB — PHOSPHORUS: Phosphorus: 2.9 mg/dL (ref 2.5–4.6)

## 2019-06-13 LAB — GLUCOSE, CAPILLARY
Glucose-Capillary: 131 mg/dL — ABNORMAL HIGH (ref 70–99)
Glucose-Capillary: 111 mg/dL — ABNORMAL HIGH (ref 70–99)
Glucose-Capillary: 133 mg/dL — ABNORMAL HIGH (ref 70–99)
Glucose-Capillary: 121 mg/dL — ABNORMAL HIGH (ref 70–99)
Glucose-Capillary: 131 mg/dL — ABNORMAL HIGH (ref 70–99)
Glucose-Capillary: 128 mg/dL — ABNORMAL HIGH (ref 70–99)

## 2019-06-13 LAB — TRIGLYCERIDES: Triglycerides: 148 mg/dL (ref <150)

## 2019-06-13 MED ORDER — METHOCARBAMOL 500 MG PO TABS
1000.0000 mg | ORAL_TABLET | Freq: Three times a day (TID) | ORAL | Status: DC
  Administered 2019-06-13 – 2019-07-19 (×106): 1000 mg
  Filled 2019-06-13 (×107): qty 2

## 2019-06-13 MED ORDER — ACETAMINOPHEN 160 MG/5ML PO SOLN
1000.0000 mg | Freq: Four times a day (QID) | ORAL | Status: DC
  Administered 2019-06-13 – 2019-06-22 (×32): 1000 mg
  Filled 2019-06-13 (×32): qty 40.6

## 2019-06-13 MED ORDER — QUETIAPINE FUMARATE 25 MG PO TABS
50.0000 mg | ORAL_TABLET | Freq: Once | ORAL | Status: DC
  Filled 2019-06-13: qty 2

## 2019-06-13 MED ORDER — MIDAZOLAM HCL 2 MG/2ML IJ SOLN
2.0000 mg | INTRAMUSCULAR | Status: DC | PRN
  Administered 2019-06-13 – 2019-06-22 (×19): 2 mg via INTRAVENOUS
  Filled 2019-06-13 (×19): qty 2

## 2019-06-13 MED ORDER — FUROSEMIDE 10 MG/ML IJ SOLN
40.0000 mg | Freq: Once | INTRAMUSCULAR | Status: AC
  Administered 2019-06-13: 40 mg via INTRAVENOUS
  Filled 2019-06-13: qty 4

## 2019-06-13 MED ORDER — QUETIAPINE FUMARATE 100 MG PO TABS
100.0000 mg | ORAL_TABLET | Freq: Two times a day (BID) | ORAL | Status: DC
  Administered 2019-06-13 – 2019-06-24 (×23): 100 mg
  Filled 2019-06-13 (×23): qty 1

## 2019-06-13 MED ORDER — KETOROLAC TROMETHAMINE 15 MG/ML IJ SOLN
15.0000 mg | Freq: Four times a day (QID) | INTRAMUSCULAR | Status: AC
  Administered 2019-06-13 – 2019-06-15 (×8): 15 mg via INTRAVENOUS
  Filled 2019-06-13 (×8): qty 1

## 2019-06-13 MED ORDER — OXYCODONE HCL 5 MG/5ML PO SOLN
5.0000 mg | ORAL | Status: DC | PRN
  Administered 2019-06-13 – 2019-06-22 (×25): 10 mg
  Filled 2019-06-13 (×23): qty 10
  Filled 2019-06-13: qty 5
  Filled 2019-06-13 (×3): qty 10
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
## 2019-06-13 NOTE — Progress Notes (Signed)
SLP Cancellation Note  Patient Details Name: Diaz Matusik MRN: [id number] DOB: February 03, 1980   Cancelled treatment:       Reason Eval/Treat Not Completed: Patient not medically ready Patient with new tracheostomy. Orders for SLP eval and treat for PMSV and swallowing received. Will follow pt closely for readiness for SLP interventions as appropriate. Currently he is vented with OG tube for feeding.   Herbie Baltimore, MA CCC-SLP  Acute Rehabilitation Services Pager [phone] Office [phone]  Lynann Beaver 06/13/2019, 1:29 PM
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
## 2019-06-14 LAB — BASIC METABOLIC PANEL
Anion gap: 11 (ref 5–15)
BUN: 31 mg/dL — ABNORMAL HIGH (ref 6–20)
CO2: 27 mmol/L (ref 22–32)
Calcium: 8 mg/dL — ABNORMAL LOW (ref 8.9–10.3)
Chloride: 110 mmol/L (ref 98–111)
Creatinine, Ser: 0.85 mg/dL (ref 0.61–1.24)
GFR calc Af Amer: >60 mL/min (ref >60)
GFR calc non Af Amer: >60 mL/min (ref >60)
Glucose, Bld: 142 mg/dL — ABNORMAL HIGH (ref 70–99)
Potassium: 3.9 mmol/L (ref 3.5–5.1)
Sodium: 148 mmol/L — ABNORMAL HIGH (ref 135–145)

## 2019-06-14 LAB — CULTURE, BLOOD (ROUTINE X 2): Special Requests: ADEQUATE

## 2019-06-14 LAB — CBC
HCT: 32.9 % — ABNORMAL LOW (ref 39.0–52.0)
Hemoglobin: 10.4 g/dL — ABNORMAL LOW (ref 13.0–17.0)
MCH: 31.1 pg (ref 26.0–34.0)
MCHC: 31.6 g/dL (ref 30.0–36.0)
MCV: 98.5 fL (ref 80.0–100.0)
Platelets: 375 10*3/uL (ref 150–400)
RBC: 3.34 MIL/uL — ABNORMAL LOW (ref 4.22–5.81)
RDW: 14.9 % (ref 11.5–15.5)
WBC: 12.7 10*3/uL — ABNORMAL HIGH (ref 4.0–10.5)
nRBC: 0 % (ref 0.0–0.2)

## 2019-06-14 LAB — GLUCOSE, CAPILLARY
Glucose-Capillary: 122 mg/dL — ABNORMAL HIGH (ref 70–99)
Glucose-Capillary: 136 mg/dL — ABNORMAL HIGH (ref 70–99)
Glucose-Capillary: 149 mg/dL — ABNORMAL HIGH (ref 70–99)
Glucose-Capillary: 169 mg/dL — ABNORMAL HIGH (ref 70–99)
Glucose-Capillary: 117 mg/dL — ABNORMAL HIGH (ref 70–99)
Glucose-Capillary: 132 mg/dL — ABNORMAL HIGH (ref 70–99)

## 2019-06-14 LAB — PHOSPHORUS: Phosphorus: 4 mg/dL (ref 2.5–4.6)

## 2019-06-14 LAB — MAGNESIUM: Magnesium: 2.4 mg/dL (ref 1.7–2.4)

## 2019-06-14 MED ORDER — PIPERACILLIN-TAZOBACTAM 3.375 G IVPB
3.3750 g | Freq: Three times a day (TID) | INTRAVENOUS | Status: AC
  Administered 2019-06-14 – 2019-06-16 (×7): 3.375 g via INTRAVENOUS
  Filled 2019-06-14 (×7): qty 50

## 2019-06-14 MED ORDER — FREE WATER
300.0000 mL | Status: DC
  Administered 2019-06-14 – 2019-06-19 (×28): 300 mL

## 2019-06-14 MED ORDER — PIVOT 1.5 CAL PO LIQD
1000.0000 mL | ORAL | Status: DC
  Administered 2019-06-15 – 2019-06-28 (×14): 1000 mL
  Filled 2019-06-14 (×6): qty 1000

## 2019-06-14 MED ORDER — PRO-STAT SUGAR FREE PO LIQD
30.0000 mL | Freq: Every day | ORAL | Status: DC
  Administered 2019-06-15 – 2019-06-28 (×13): 30 mL
  Filled 2019-06-14 (×13): qty 30

## 2019-06-14 MED ORDER — DULOXETINE HCL 60 MG PO CPEP
60.0000 mg | ORAL_CAPSULE | Freq: Every day | ORAL | Status: DC
  Administered 2019-06-14 – 2019-06-21 (×8): 60 mg via ORAL
  Filled 2019-06-14 (×9): qty 1

## 2019-06-14 NOTE — Progress Notes (Signed)
Removed nasal packing per Dr. Celine Ahr instructions. Both nasal packings intact, no current drainage from nares.
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
## 2019-06-15 LAB — BASIC METABOLIC PANEL
Anion gap: 9 (ref 5–15)
BUN: 36 mg/dL — ABNORMAL HIGH (ref 6–20)
CO2: 26 mmol/L (ref 22–32)
Calcium: 8.2 mg/dL — ABNORMAL LOW (ref 8.9–10.3)
Chloride: 112 mmol/L — ABNORMAL HIGH (ref 98–111)
Creatinine, Ser: 0.82 mg/dL (ref 0.61–1.24)
GFR calc Af Amer: >60 mL/min (ref >60)
GFR calc non Af Amer: >60 mL/min (ref >60)
Glucose, Bld: 158 mg/dL — ABNORMAL HIGH (ref 70–99)
Potassium: 3.7 mmol/L (ref 3.5–5.1)
Sodium: 147 mmol/L — ABNORMAL HIGH (ref 135–145)

## 2019-06-15 LAB — GLUCOSE, CAPILLARY
Glucose-Capillary: 138 mg/dL — ABNORMAL HIGH (ref 70–99)
Glucose-Capillary: 149 mg/dL — ABNORMAL HIGH (ref 70–99)
Glucose-Capillary: 121 mg/dL — ABNORMAL HIGH (ref 70–99)
Glucose-Capillary: 132 mg/dL — ABNORMAL HIGH (ref 70–99)
Glucose-Capillary: 136 mg/dL — ABNORMAL HIGH (ref 70–99)
Glucose-Capillary: 114 mg/dL — ABNORMAL HIGH (ref 70–99)

## 2019-06-15 LAB — CBC
HCT: 31.8 % — ABNORMAL LOW (ref 39.0–52.0)
Hemoglobin: 10 g/dL — ABNORMAL LOW (ref 13.0–17.0)
MCH: 30.9 pg (ref 26.0–34.0)
MCHC: 31.4 g/dL (ref 30.0–36.0)
MCV: 98.1 fL (ref 80.0–100.0)
Platelets: 403 10*3/uL — ABNORMAL HIGH (ref 150–400)
RBC: 3.24 MIL/uL — ABNORMAL LOW (ref 4.22–5.81)
RDW: 14.8 % (ref 11.5–15.5)
WBC: 13.4 10*3/uL — ABNORMAL HIGH (ref 4.0–10.5)
nRBC: 0 % (ref 0.0–0.2)

## 2019-06-15 LAB — PHOSPHORUS: Phosphorus: 4.1 mg/dL (ref 2.5–4.6)

## 2019-06-15 LAB — CULTURE, RESPIRATORY W GRAM STAIN

## 2019-06-15 LAB — MAGNESIUM: Magnesium: 2.3 mg/dL (ref 1.7–2.4)
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
## 2019-06-16 ENCOUNTER — Inpatient Hospital Stay (HOSPITAL_COMMUNITY): Payer: Medicaid Other

## 2019-06-16 LAB — BASIC METABOLIC PANEL
Anion gap: 13 (ref 5–15)
BUN: 27 mg/dL — ABNORMAL HIGH (ref 6–20)
CO2: 24 mmol/L (ref 22–32)
Calcium: 8.4 mg/dL — ABNORMAL LOW (ref 8.9–10.3)
Chloride: 110 mmol/L (ref 98–111)
Creatinine, Ser: 0.74 mg/dL (ref 0.61–1.24)
GFR calc Af Amer: >60 mL/min (ref >60)
GFR calc non Af Amer: >60 mL/min (ref >60)
Glucose, Bld: 142 mg/dL — ABNORMAL HIGH (ref 70–99)
Potassium: 3.4 mmol/L — ABNORMAL LOW (ref 3.5–5.1)
Sodium: 147 mmol/L — ABNORMAL HIGH (ref 135–145)

## 2019-06-16 LAB — CBC
HCT: 31.9 % — ABNORMAL LOW (ref 39.0–52.0)
Hemoglobin: 10 g/dL — ABNORMAL LOW (ref 13.0–17.0)
MCH: 31 pg (ref 26.0–34.0)
MCHC: 31.3 g/dL (ref 30.0–36.0)
MCV: 98.8 fL (ref 80.0–100.0)
Platelets: 476 10*3/uL — ABNORMAL HIGH (ref 150–400)
RBC: 3.23 MIL/uL — ABNORMAL LOW (ref 4.22–5.81)
RDW: 14.6 % (ref 11.5–15.5)
WBC: 11.9 10*3/uL — ABNORMAL HIGH (ref 4.0–10.5)
nRBC: 0 % (ref 0.0–0.2)

## 2019-06-16 LAB — GLUCOSE, CAPILLARY
Glucose-Capillary: 150 mg/dL — ABNORMAL HIGH (ref 70–99)
Glucose-Capillary: 122 mg/dL — ABNORMAL HIGH (ref 70–99)
Glucose-Capillary: 144 mg/dL — ABNORMAL HIGH (ref 70–99)
Glucose-Capillary: 131 mg/dL — ABNORMAL HIGH (ref 70–99)
Glucose-Capillary: 125 mg/dL — ABNORMAL HIGH (ref 70–99)
Glucose-Capillary: 131 mg/dL — ABNORMAL HIGH (ref 70–99)

## 2019-06-16 LAB — MAGNESIUM: Magnesium: 2.2 mg/dL (ref 1.7–2.4)

## 2019-06-16 LAB — PHOSPHORUS: Phosphorus: 3.2 mg/dL (ref 2.5–4.6)

## 2019-06-16 MED ORDER — FUROSEMIDE 10 MG/ML IJ SOLN
40.0000 mg | Freq: Once | INTRAMUSCULAR | Status: AC
  Administered 2019-06-16: 40 mg via INTRAVENOUS
  Filled 2019-06-16: qty 4

## 2019-06-16 MED ORDER — PANTOPRAZOLE SODIUM 40 MG PO PACK
40.0000 mg | PACK | Freq: Every day | ORAL | Status: DC
  Administered 2019-06-16 – 2019-07-19 (×32): 40 mg
  Filled 2019-06-16 (×33): qty 20

## 2019-06-16 MED ORDER — POTASSIUM CHLORIDE 20 MEQ PO PACK
40.0000 meq | PACK | Freq: Once | ORAL | Status: AC
  Administered 2019-06-16: 40 meq
  Filled 2019-06-16: qty 2

## 2019-06-16 MED ORDER — SODIUM CHLORIDE 0.9 % IV SOLN
2.0000 g | INTRAVENOUS | Status: AC
  Administered 2019-06-16 – 2019-06-18 (×2): 2 g via INTRAVENOUS
  Filled 2019-06-16: qty 20
  Filled 2019-06-16: qty 2
  Filled 2019-06-16: qty 20

## 2019-06-16 NOTE — Progress Notes (Signed)
RT note: patient placed on CPAP/PSV of 10/5 at 0740.  Currently tolerating well.  Will continue to monitor.
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
## 2019-06-16 NOTE — Progress Notes (Signed)
RT note: upon afternoon assessment, noted that patient's RR had increased and work of breathing began to increase therefore placed patient back on full support ventilation.  Currently tolerating well.  Will continue to monitor.

## 2019-06-17 ENCOUNTER — Inpatient Hospital Stay (HOSPITAL_COMMUNITY): Payer: Medicaid Other

## 2019-06-17 ENCOUNTER — Inpatient Hospital Stay (HOSPITAL_COMMUNITY): Payer: Medicaid Other | Admitting: Certified Registered Nurse Anesthetist

## 2019-06-17 ENCOUNTER — Encounter (HOSPITAL_COMMUNITY): Payer: Self-pay

## 2019-06-17 ENCOUNTER — Encounter (HOSPITAL_COMMUNITY): Admission: EM | Disposition: A | Discharge status: Rehabilitation Facility | Payer: Self-pay | Source: Home / Self Care

## 2019-06-17 DIAGNOSIS — R4182 Altered mental status, unspecified: Secondary | ICD-10-CM

## 2019-06-17 HISTORY — PX: PEG PLACEMENT: SHX5437

## 2019-06-17 HISTORY — PX: ESOPHAGOGASTRODUODENOSCOPY: SHX5428

## 2019-06-17 HISTORY — PX: ORIF ZYGOMATIC FRACTURE: SHX2134

## 2019-06-17 LAB — CBC
HCT: 30.9 % — ABNORMAL LOW (ref 39.0–52.0)
Hemoglobin: 9.7 g/dL — ABNORMAL LOW (ref 13.0–17.0)
MCH: 30.7 pg (ref 26.0–34.0)
MCHC: 31.4 g/dL (ref 30.0–36.0)
MCV: 97.8 fL (ref 80.0–100.0)
Platelets: 510 10*3/uL — ABNORMAL HIGH (ref 150–400)
RBC: 3.16 MIL/uL — ABNORMAL LOW (ref 4.22–5.81)
RDW: 14.4 % (ref 11.5–15.5)
WBC: 12.5 10*3/uL — ABNORMAL HIGH (ref 4.0–10.5)
nRBC: 0 % (ref 0.0–0.2)

## 2019-06-17 LAB — CULTURE, BLOOD (ROUTINE X 2)
Culture: NO GROWTH
Special Requests: ADEQUATE

## 2019-06-17 LAB — BASIC METABOLIC PANEL
Anion gap: 11 (ref 5–15)
BUN: 29 mg/dL — ABNORMAL HIGH (ref 6–20)
CO2: 24 mmol/L (ref 22–32)
Calcium: 8.3 mg/dL — ABNORMAL LOW (ref 8.9–10.3)
Chloride: 108 mmol/L (ref 98–111)
Creatinine, Ser: 0.7 mg/dL (ref 0.61–1.24)
GFR calc Af Amer: >60 mL/min (ref >60)
GFR calc non Af Amer: >60 mL/min (ref >60)
Glucose, Bld: 142 mg/dL — ABNORMAL HIGH (ref 70–99)
Potassium: 3.5 mmol/L (ref 3.5–5.1)
Sodium: 143 mmol/L (ref 135–145)

## 2019-06-17 LAB — GLUCOSE, CAPILLARY
Glucose-Capillary: 103 mg/dL — ABNORMAL HIGH (ref 70–99)
Glucose-Capillary: 128 mg/dL — ABNORMAL HIGH (ref 70–99)
Glucose-Capillary: 122 mg/dL — ABNORMAL HIGH (ref 70–99)
Glucose-Capillary: 115 mg/dL — ABNORMAL HIGH (ref 70–99)
Glucose-Capillary: 135 mg/dL — ABNORMAL HIGH (ref 70–99)

## 2019-06-17 SURGERY — OPEN REDUCTION INTERNAL FIXATION (ORIF) ZYGOMATIC FRACTURE
Anesthesia: General | Site: Face

## 2019-06-17 MED ORDER — BSS IO SOLN
INTRAOCULAR | Status: AC
  Filled 2019-06-17: qty 15

## 2019-06-17 MED ORDER — LIDOCAINE-EPINEPHRINE 1 %-1:100000 IJ SOLN
INTRAMUSCULAR | Status: AC
  Filled 2019-06-17: qty 1

## 2019-06-17 MED ORDER — FENTANYL CITRATE (PF) 250 MCG/5ML IJ SOLN
INTRAMUSCULAR | Status: DC | PRN
  Administered 2019-06-17 (×5): 50 ug via INTRAVENOUS

## 2019-06-17 MED ORDER — FENTANYL CITRATE (PF) 250 MCG/5ML IJ SOLN
INTRAMUSCULAR | Status: AC
  Filled 2019-06-17: qty 5

## 2019-06-17 MED ORDER — BSS IO SOLN
INTRAOCULAR | Status: DC | PRN
  Administered 2019-06-17: 1 via INTRAOCULAR

## 2019-06-17 MED ORDER — LIDOCAINE-EPINEPHRINE 1 %-1:100000 IJ SOLN
INTRAMUSCULAR | Status: DC | PRN
  Administered 2019-06-17: 20 mL
  Administered 2019-06-17: 10 mL

## 2019-06-17 MED ORDER — BACITRACIN ZINC 500 UNIT/GM EX OINT
TOPICAL_OINTMENT | CUTANEOUS | Status: AC
  Filled 2019-06-17: qty 28.35

## 2019-06-17 MED ORDER — ROCURONIUM BROMIDE 100 MG/10ML IV SOLN
INTRAVENOUS | Status: DC | PRN
  Administered 2019-06-17 (×2): 50 mg via INTRAVENOUS
  Administered 2019-06-17: 100 mg via INTRAVENOUS
  Administered 2019-06-17 (×3): 50 mg via INTRAVENOUS

## 2019-06-17 MED ORDER — SODIUM HYALURONATE 10 MG/ML IO SOLN
INTRAOCULAR | Status: AC
  Filled 2019-06-17: qty 0.85

## 2019-06-17 MED ORDER — PROPOFOL 500 MG/50ML IV EMUL
INTRAVENOUS | Status: DC | PRN
  Administered 2019-06-17: 50 ug/kg/min via INTRAVENOUS

## 2019-06-17 MED ORDER — 0.9 % SODIUM CHLORIDE (POUR BTL) OPTIME
TOPICAL | Status: DC | PRN
  Administered 2019-06-17 (×2): 1000 mL

## 2019-06-17 MED ORDER — ALBUMIN HUMAN 5 % IV SOLN: INTRAVENOUS | Status: DC | PRN

## 2019-06-17 MED ORDER — OXYMETAZOLINE HCL 0.05 % NA SOLN
NASAL | Status: AC
  Filled 2019-06-17: qty 30

## 2019-06-17 MED ORDER — LACTATED RINGERS IV SOLN: INTRAVENOUS | Status: DC | PRN

## 2019-06-17 SURGICAL SUPPLY — 82 items
BIT DRILL TWIST 1.3X5 (BIT) ×3
BIT DRILL TWIST 1.3X5MM (BIT) IMPLANT
BLADE CLIPPER SURG (BLADE) ×1 IMPLANT
BLADE SURG 15 STRL LF DISP TIS (BLADE) IMPLANT
BLADE SURG 15 STRL SS (BLADE)
BNDG COHESIVE 4X5 TAN STRL (GAUZE/BANDAGES/DRESSINGS) ×1 IMPLANT
BNDG GAUZE ELAST 4 BULKY (GAUZE/BANDAGES/DRESSINGS) ×1 IMPLANT
CANISTER SUCT 3000ML PPV (MISCELLANEOUS) ×8 IMPLANT
CATH GASTROSTOMY 20FR (CATHETERS) IMPLANT
CHLORAPREP W/TINT 26 (MISCELLANEOUS) ×3 IMPLANT
CLEANER TIP ELECTROSURG 2X2 (MISCELLANEOUS) ×4 IMPLANT
COVER SURGICAL LIGHT HANDLE (MISCELLANEOUS) ×8 IMPLANT
COVER WAND RF STERILE (DRAPES) ×6 IMPLANT
DECANTER SPIKE VIAL GLASS SM (MISCELLANEOUS) ×4 IMPLANT
DRAPE HALF SHEET 40X57 (DRAPES) IMPLANT
DRAPE LAPAROSCOPIC ABDOMINAL (DRAPES) ×3 IMPLANT
DRESSING NASAL POPE 10X1.5X2.5 (GAUZE/BANDAGES/DRESSINGS) IMPLANT
DRILL BIT TWIST 1.3X5MM (BIT) ×1
DRSG NASAL POPE 10X1.5X2.5 (GAUZE/BANDAGES/DRESSINGS) ×8
ELECT CAUTERY BLADE 6.4 (BLADE) ×4 IMPLANT
ELECT COATED BLADE 2.86 ST (ELECTRODE) ×4 IMPLANT
ELECT NDL BLADE 2-5/6 (NEEDLE) IMPLANT
ELECT NEEDLE BLADE 2-5/6 (NEEDLE) ×4 IMPLANT
ELECT REM PT RETURN 9FT ADLT (ELECTROSURGICAL) ×4
ELECTRODE REM PT RTRN 9FT ADLT (ELECTROSURGICAL) ×6 IMPLANT
GAUZE 4X4 16PLY RFD (DISPOSABLE) ×2 IMPLANT
GAUZE PACKING FOLDED 1IN STRL (GAUZE/BANDAGES/DRESSINGS) ×1 IMPLANT
GAUZE SPONGE 4X4 12PLY STRL (GAUZE/BANDAGES/DRESSINGS) ×4 IMPLANT
GLOVE BIO SURGEON STRL SZ7.5 (GLOVE) ×4 IMPLANT
GLOVE BIO SURGEON STRL SZ8 (GLOVE) ×9 IMPLANT
GLOVE BIOGEL PI IND STRL 8 (GLOVE) ×3 IMPLANT
GLOVE BIOGEL PI INDICATOR 8 (GLOVE) ×1
GOWN STRL REUS W/ TWL LRG LVL3 (GOWN DISPOSABLE) ×9 IMPLANT
GOWN STRL REUS W/ TWL XL LVL3 (GOWN DISPOSABLE) ×3 IMPLANT
GOWN STRL REUS W/TWL LRG LVL3 (GOWN DISPOSABLE) ×3
GOWN STRL REUS W/TWL XL LVL3 (GOWN DISPOSABLE) ×1
HANDLE SUCTION POOLE (INSTRUMENTS) IMPLANT
KIT BASIN OR (CUSTOM PROCEDURE TRAY) ×7 IMPLANT
KIT SPLINT NASAL DENVER SM BEI (GAUZE/BANDAGES/DRESSINGS) ×1 IMPLANT
KIT TURNOVER KIT B (KITS) ×8 IMPLANT
NDL HYPO 25GX1X1/2 BEV (NEEDLE) IMPLANT
NDL PRECISIONGLIDE 27X1.5 (NEEDLE) ×3 IMPLANT
NEEDLE HYPO 25GX1X1/2 BEV (NEEDLE) ×4 IMPLANT
NEEDLE PRECISIONGLIDE 27X1.5 (NEEDLE) ×4 IMPLANT
NS IRRIG 1000ML POUR BTL (IV SOLUTION) ×8 IMPLANT
PACK GENERAL/GYN (CUSTOM PROCEDURE TRAY) ×3 IMPLANT
PAD ARMBOARD 7.5X6 YLW CONV (MISCELLANEOUS) ×16 IMPLANT
PENCIL SMOKE EVACUATOR (MISCELLANEOUS) ×7 IMPLANT
PLATE BONE 8MMX6 0.6MM RIGHT (Plate) ×1 IMPLANT
PLATE MALL ROUND CMF MED (Plate) ×1 IMPLANT
PLUG CATH AND CAP STER (CATHETERS) IMPLANT
POSITIONER HEAD DONUT 9IN (MISCELLANEOUS) IMPLANT
PROTECTOR CORNEAL (OPHTHALMIC RELATED) IMPLANT
SCISSORS WIRE ANG 4 3/4 DISP (INSTRUMENTS) ×3 IMPLANT
SCREW MID FACE 1.7X6MM SLF TAP (Screw) ×6 IMPLANT
SCREW UNIII AXS SD 1.5X4 (Screw) ×4 IMPLANT
SCREW UNIII AXS SD 1.5X5 (Screw) ×1 IMPLANT
SUCTION POOLE HANDLE (INSTRUMENTS)
SUT CHROMIC 4 0 PS 2 18 (SUTURE) ×3 IMPLANT
SUT ETHILON 4 0 CL P 3 (SUTURE) IMPLANT
SUT FIBERWIRE 4-0 18 TAPR NDL (SUTURE) ×4
SUT MNCRL AB 3-0 PS2 18 (SUTURE) ×1 IMPLANT
SUT PDS AB 1 CT  36 (SUTURE)
SUT PDS AB 1 CT 36 (SUTURE) ×3 IMPLANT
SUT PLAIN GUT FAST 5-0 (SUTURE) ×1 IMPLANT
SUT PROLENE 6 0 PC 1 (SUTURE) IMPLANT
SUT SILK 0 FSL (SUTURE) ×2 IMPLANT
SUT SILK 2 0 SH (SUTURE) ×6 IMPLANT
SUT SILK 2 0 SH CR/8 (SUTURE) ×3 IMPLANT
SUT STEEL 0 (SUTURE)
SUT STEEL 0 18XMFL TIE 17 (SUTURE) IMPLANT
SUT STEEL 2 (SUTURE) IMPLANT
SUT VIC AB 3-0 SH 27 (SUTURE) ×1
SUT VIC AB 3-0 SH 27XBRD (SUTURE) IMPLANT
SUT VICRYL 4-0 PS2 18IN ABS (SUTURE) IMPLANT
SUTURE FIBERWR 4-0 18 TAPR NDL (SUTURE) IMPLANT
SYRINGE TOOMEY DISP (SYRINGE) ×3 IMPLANT
TOWEL GREEN STERILE (TOWEL DISPOSABLE) ×3 IMPLANT
TOWEL GREEN STERILE FF (TOWEL DISPOSABLE) ×7 IMPLANT
TRAY ENT MC OR (CUSTOM PROCEDURE TRAY) ×4 IMPLANT
TRAY FOLEY MTR SLVR 14FR STAT (SET/KITS/TRAYS/PACK) IMPLANT
WATER STERILE IRR 1000ML POUR (IV SOLUTION) ×4 IMPLANT
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
## 2019-06-17 NOTE — Anesthesia Postprocedure Evaluation (Signed)
Anesthesia Post Note  Patient: Chase Caldwell  Procedure(s) Performed: OPEN RECONSTRUCTION FRONTAL SINUS, RIGHT MIDFACE RECONSTRUCTION, RESUSPENSION MEDIAL CANTHUS (N/A Face) PERCUTANEOUS ENDOSCOPIC GASTROSTOMY (PEG) PLACEMENT (N/A Abdomen) Esophagogastroduodenoscopy (Egd)     Patient location during evaluation: ICU Anesthesia Type: General Level of consciousness: patient remains intubated per anesthesia plan Pain management: pain level controlled Vital Signs Assessment: post-procedure vital signs reviewed and stable Respiratory status: patient on ventilator - see flowsheet for VS Cardiovascular status: stable Postop Assessment: no apparent nausea or vomiting Anesthetic complications: no    Last Vitals:  Vitals:   06/17/19 2131 06/17/19 2200  BP:  (!) 147/97  Pulse:    Resp:  (!) 26  Temp:    SpO2: 100% 100%    Last Pain:  Vitals:   06/17/19 1600  TempSrc: Axillary  PainSc:                  Osmara Drummonds
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
## 2019-06-17 NOTE — Progress Notes (Signed)
EEG complete - results pending 
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
## 2019-06-17 NOTE — Transfer of Care (Signed)
Immediate Anesthesia Transfer of Care Note  Patient: Chase Caldwell  Procedure(s) Performed: OPEN RECONSTRUCTION FRONTAL SINUS, RIGHT MIDFACE RECONSTRUCTION, RESUSPENSION MEDIAL CANTHUS (N/A Face) PERCUTANEOUS ENDOSCOPIC GASTROSTOMY (PEG) PLACEMENT (N/A Abdomen) Esophagogastroduodenoscopy (Egd)  Patient Location: ICU  Anesthesia Type:General  Level of Consciousness: sedated and Patient remains intubated per anesthesia plan  Airway & Oxygen Therapy: Patient remains intubated per anesthesia plan and Patient placed on Ventilator (see vital sign flow sheet for setting)  Post-op Assessment: Report given to RN and Post -op Vital signs reviewed and stable  Post vital signs: Reviewed and stable  Last Vitals:  Vitals Value Taken Time  BP 140/83 06/17/19 2112  Temp    Pulse 102 06/17/19 2116  Resp 26 06/17/19 2116  SpO2 99 % 06/17/19 2116  Vitals shown include unvalidated device data.  Last Pain:  Vitals:   06/17/19 1600  TempSrc: Axillary  PainSc:          Complications: No apparent anesthesia complications

## 2019-06-17 NOTE — Op Note (Signed)
   Procedure Note  Date: 06/17/2019  Procedure: esophagogastroduodenoscopy (EGD) and percutaneous endoscopic gastrostomy (PEG) tube placement  Pre-op diagnosis: dysphagia, malnutrition Post-op diagnosis: same  Indication and clinical history: 72M with dysphagia  Surgeon: Jesusita Oka, MD  Anesthesia: MAC  Findings:  . Specimen: none . EBL: <5cc . Drains/Implants: PEG tube, 5cm at the skin   Disposition: ICU/PACU  Description of Procedure: The patient was positioned supine. Time-out was performed verifying correct patient, procedure, signature of informed consent, and pre-operative antibiotics as indicated. MAC induction was uneventful and a bite block was placed into the oropharynx. The endoscope was inserted into the oropharynx and advanced down the esophagus into the stomach and into the duodenum. The visualized esophagus and duodenum were unremarkable. The endoscope was retracted back into the stomach and the stomach was insufflated. The stomach was inspected and was also normal. Transillumination was performed. The light was visible on the external skin and dimpling of the stomach was noted endoscopically with manual pressure. The abdomen was prepped and draped in the usual sterile fashion. Transillumination and dimpling were repeated and local anesthetic was infiltrated to make a skin wheal at the site of transillumination. The needle was inserted perpendicularly to the skin and the tip of the needle was visualized endoscopically. As the needle was retracted, the tract was also anesthetized. A skin nick was made at the site of the wheal and an introducer needle and sheath were inserted. The needle was removed and guidewire inserted. The guidewire was grasped by an endoscopic snare and the snare, guidewire, and endoscope retracted out of the oropharynx. The PEG tube was secured to the guidewire and retracted through the mouth and esophagus into the stomach. The PEG tube was secured with  a bolster and was visualized endoscopically to spin freely circumferentially and also be without gaps between the internal bumper and the stomach wall. There was no evidence of bleeding. The PEG bolster was secured at  5cm at the skin and there were no gaps between the bolster and the abdominal wall. The stomach was desufflated endoscopically and the endoscope removed. The bite block was also removed. The patient tolerated the procedure well and there were no complications.   The patient may have water and medications administered via the PEG tube beginning immediately and tube feeds may be initiated four hours post-procedure.    Jesusita Oka, MD General and [facility]

## 2019-06-18 ENCOUNTER — Encounter: Payer: Self-pay | Admitting: *Deleted

## 2019-06-18 ENCOUNTER — Inpatient Hospital Stay (HOSPITAL_COMMUNITY): Payer: Medicaid Other

## 2019-06-18 LAB — BASIC METABOLIC PANEL
Anion gap: 13 (ref 5–15)
BUN: 25 mg/dL — ABNORMAL HIGH (ref 6–20)
CO2: 24 mmol/L (ref 22–32)
Calcium: 8.2 mg/dL — ABNORMAL LOW (ref 8.9–10.3)
Chloride: 109 mmol/L (ref 98–111)
Creatinine, Ser: 0.94 mg/dL (ref 0.61–1.24)
GFR calc Af Amer: >60 mL/min (ref >60)
GFR calc non Af Amer: >60 mL/min (ref >60)
Glucose, Bld: 152 mg/dL — ABNORMAL HIGH (ref 70–99)
Potassium: 4 mmol/L (ref 3.5–5.1)
Sodium: 146 mmol/L — ABNORMAL HIGH (ref 135–145)

## 2019-06-18 LAB — GLUCOSE, CAPILLARY
Glucose-Capillary: 129 mg/dL — ABNORMAL HIGH (ref 70–99)
Glucose-Capillary: 159 mg/dL — ABNORMAL HIGH (ref 70–99)
Glucose-Capillary: 156 mg/dL — ABNORMAL HIGH (ref 70–99)
Glucose-Capillary: 132 mg/dL — ABNORMAL HIGH (ref 70–99)
Glucose-Capillary: 162 mg/dL — ABNORMAL HIGH (ref 70–99)
Glucose-Capillary: 135 mg/dL — ABNORMAL HIGH (ref 70–99)

## 2019-06-18 LAB — CBC
HCT: 29.1 % — ABNORMAL LOW (ref 39.0–52.0)
Hemoglobin: 9.2 g/dL — ABNORMAL LOW (ref 13.0–17.0)
MCH: 30.8 pg (ref 26.0–34.0)
MCHC: 31.6 g/dL (ref 30.0–36.0)
MCV: 97.3 fL (ref 80.0–100.0)
Platelets: 564 10*3/uL — ABNORMAL HIGH (ref 150–400)
RBC: 2.99 MIL/uL — ABNORMAL LOW (ref 4.22–5.81)
RDW: 14.6 % (ref 11.5–15.5)
WBC: 14.9 10*3/uL — ABNORMAL HIGH (ref 4.0–10.5)
nRBC: 0 % (ref 0.0–0.2)

## 2019-06-18 MED ORDER — FUROSEMIDE 10 MG/ML IJ SOLN
40.0000 mg | Freq: Once | INTRAMUSCULAR | Status: AC
  Administered 2019-06-18: 40 mg via INTRAVENOUS
  Filled 2019-06-18: qty 4

## 2019-06-18 MED ORDER — GADOBUTROL 1 MMOL/ML IV SOLN
10.0000 mL | Freq: Once | INTRAVENOUS | Status: AC | PRN
  Administered 2019-06-18: 10 mL via INTRAVENOUS
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
## 2019-06-18 NOTE — Progress Notes (Signed)
Assisted tele visit to patient with family member.  Hence Derrick M, RN  
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
## 2019-06-19 ENCOUNTER — Encounter: Payer: Self-pay | Admitting: Anesthesiology

## 2019-06-19 ENCOUNTER — Inpatient Hospital Stay (HOSPITAL_COMMUNITY): Payer: Medicaid Other

## 2019-06-19 LAB — GLUCOSE, CAPILLARY
Glucose-Capillary: 191 mg/dL — ABNORMAL HIGH (ref 70–99)
Glucose-Capillary: 128 mg/dL — ABNORMAL HIGH (ref 70–99)
Glucose-Capillary: 160 mg/dL — ABNORMAL HIGH (ref 70–99)
Glucose-Capillary: 136 mg/dL — ABNORMAL HIGH (ref 70–99)
Glucose-Capillary: 155 mg/dL — ABNORMAL HIGH (ref 70–99)
Glucose-Capillary: 128 mg/dL — ABNORMAL HIGH (ref 70–99)

## 2019-06-19 LAB — BASIC METABOLIC PANEL
Anion gap: 10 (ref 5–15)
BUN: 27 mg/dL — ABNORMAL HIGH (ref 6–20)
CO2: 25 mmol/L (ref 22–32)
Calcium: 8.7 mg/dL — ABNORMAL LOW (ref 8.9–10.3)
Chloride: 114 mmol/L — ABNORMAL HIGH (ref 98–111)
Creatinine, Ser: 0.97 mg/dL (ref 0.61–1.24)
GFR calc Af Amer: >60 mL/min (ref >60)
GFR calc non Af Amer: >60 mL/min (ref >60)
Glucose, Bld: 176 mg/dL — ABNORMAL HIGH (ref 70–99)
Potassium: 3.5 mmol/L (ref 3.5–5.1)
Sodium: 149 mmol/L — ABNORMAL HIGH (ref 135–145)

## 2019-06-19 LAB — CBC
HCT: 32.1 % — ABNORMAL LOW (ref 39.0–52.0)
Hemoglobin: 10 g/dL — ABNORMAL LOW (ref 13.0–17.0)
MCH: 30.6 pg (ref 26.0–34.0)
MCHC: 31.2 g/dL (ref 30.0–36.0)
MCV: 98.2 fL (ref 80.0–100.0)
Platelets: 693 10*3/uL — ABNORMAL HIGH (ref 150–400)
RBC: 3.27 MIL/uL — ABNORMAL LOW (ref 4.22–5.81)
RDW: 14.6 % (ref 11.5–15.5)
WBC: 15.3 10*3/uL — ABNORMAL HIGH (ref 4.0–10.5)
nRBC: 0 % (ref 0.0–0.2)

## 2019-06-19 LAB — AMMONIA: Ammonia: 21 umol/L (ref 9–35)

## 2019-06-19 LAB — TSH: TSH: 0.914 u[IU]/mL (ref 0.350–4.500)

## 2019-06-19 LAB — T4, FREE: Free T4: 1.03 ng/dL (ref 0.61–1.12)

## 2019-06-19 LAB — FOLATE: Folate: 7.9 ng/mL (ref >5.9)

## 2019-06-19 LAB — VITAMIN B12: Vitamin B-12: 341 pg/mL (ref 180–914)

## 2019-06-19 MED ORDER — FUROSEMIDE 10 MG/ML IJ SOLN
20.0000 mg | Freq: Once | INTRAMUSCULAR | Status: AC
  Administered 2019-06-19: 20 mg via INTRAVENOUS
  Filled 2019-06-19: qty 2

## 2019-06-19 MED ORDER — FREE WATER
500.0000 mL | Status: DC
  Administered 2019-06-19 – 2019-06-25 (×34): 500 mL

## 2019-06-19 MED ORDER — SODIUM CHLORIDE 0.9 % IV SOLN
3.0000 g | Freq: Four times a day (QID) | INTRAVENOUS | Status: AC
  Administered 2019-06-19 – 2019-06-24 (×23): 3 g via INTRAVENOUS
  Filled 2019-06-19: qty 8
  Filled 2019-06-19 (×10): qty 3
  Filled 2019-06-19: qty 8
  Filled 2019-06-19 (×2): qty 3
  Filled 2019-06-19 (×2): qty 8
  Filled 2019-06-19 (×6): qty 3
  Filled 2019-06-19: qty 8
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
## 2019-06-19 NOTE — Op Note (Signed)
[facility] Patient Name: Chase Caldwell Procedure Date : 06/17/2019 MRN: [id number] Attending MD: Jesusita Oka ,  Date of Birth: 1980-03-30 CSN: [id number] Age: 39 Admit Type: Inpatient Procedure:                Upper GI endoscopy Indications:              Dysphagia Providers:                Cathie Olden, Technician Referring MD:              Medicines:                General Anesthesia Complications:            No immediate complications. Estimated blood loss:                            Minimal. Estimated Blood Loss:     Estimated blood loss was minimal. Procedure:                After obtaining informed consent, the endoscope was                            passed under direct vision. Throughout the                            procedure, the patient's blood pressure, pulse, and                            oxygen saturations were monitored continuously. The                            GIF-H190 ([id number]) Olympus gastroscope was                            introduced through the mouth, and advanced to the                            duodenal bulb. The upper GI endoscopy was                            accomplished without difficulty. The patient                            tolerated the procedure well. Scope In: Scope Out: Findings:      The [Site] was normal.      The esophagus was normal.      The stomach was normal.      The examined duodenum was normal. Impression:               - Normal esophagus.                           - Normal stomach.                           - Normal examined duodenum.                           -  No specimens collected. Recommendation:           - May administer water and medications now and may                            start tube feeds four hours post-procedure. Procedure Code(s):        --- Professional ---                           [id number], Esophagogastroduodenoscopy, flexible,                            transoral;  diagnostic, including collection of                            specimen(s) by brushing or washing, when performed                            (separate procedure) Diagnosis Code(s):        --- Professional ---                           R13.10, Dysphagia, unspecified CPT copyright 2019 American Medical Association. All rights reserved. The codes documented in this report are preliminary and upon coder review may  be revised to meet current compliance requirements. Jesusita Oka,  06/19/2019 6:52:13 AM Number of Addenda: 0
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
## 2019-06-19 NOTE — Addendum Note (Signed)
Addendum  created 06/19/19 3893 by Annye Asa, MD   [place] recorded in [place], [place] filed, Intraprocedure Event edited
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
## 2019-06-19 NOTE — Progress Notes (Signed)
Assisted tele visit to patient with father.  Thomas, Noorah Giammona Renee, RN   

## 2019-06-20 ENCOUNTER — Inpatient Hospital Stay (HOSPITAL_COMMUNITY): Payer: Medicaid Other

## 2019-06-20 LAB — BASIC METABOLIC PANEL
Anion gap: 13 (ref 5–15)
BUN: 29 mg/dL — ABNORMAL HIGH (ref 6–20)
CO2: 25 mmol/L (ref 22–32)
Calcium: 8.7 mg/dL — ABNORMAL LOW (ref 8.9–10.3)
Chloride: 114 mmol/L — ABNORMAL HIGH (ref 98–111)
Creatinine, Ser: 0.91 mg/dL (ref 0.61–1.24)
GFR calc Af Amer: >60 mL/min (ref >60)
GFR calc non Af Amer: >60 mL/min (ref >60)
Glucose, Bld: 181 mg/dL — ABNORMAL HIGH (ref 70–99)
Potassium: 4 mmol/L (ref 3.5–5.1)
Sodium: 152 mmol/L — ABNORMAL HIGH (ref 135–145)

## 2019-06-20 LAB — GLUCOSE, CAPILLARY
Glucose-Capillary: 152 mg/dL — ABNORMAL HIGH (ref 70–99)
Glucose-Capillary: 155 mg/dL — ABNORMAL HIGH (ref 70–99)
Glucose-Capillary: 111 mg/dL — ABNORMAL HIGH (ref 70–99)
Glucose-Capillary: 149 mg/dL — ABNORMAL HIGH (ref 70–99)
Glucose-Capillary: 128 mg/dL — ABNORMAL HIGH (ref 70–99)
Glucose-Capillary: 150 mg/dL — ABNORMAL HIGH (ref 70–99)
Glucose-Capillary: 132 mg/dL — ABNORMAL HIGH (ref 70–99)

## 2019-06-20 LAB — CBC
HCT: 33.8 % — ABNORMAL LOW (ref 39.0–52.0)
Hemoglobin: 10.3 g/dL — ABNORMAL LOW (ref 13.0–17.0)
MCH: 30.6 pg (ref 26.0–34.0)
MCHC: 30.5 g/dL (ref 30.0–36.0)
MCV: 100.3 fL — ABNORMAL HIGH (ref 80.0–100.0)
Platelets: 701 10*3/uL — ABNORMAL HIGH (ref 150–400)
RBC: 3.37 MIL/uL — ABNORMAL LOW (ref 4.22–5.81)
RDW: 15 % (ref 11.5–15.5)
WBC: 15.6 10*3/uL — ABNORMAL HIGH (ref 4.0–10.5)
nRBC: 0 % (ref 0.0–0.2)

## 2019-06-20 LAB — T3, FREE: T3, Free: 1.9 pg/mL — ABNORMAL LOW (ref 2.0–4.4)

## 2019-06-20 NOTE — Progress Notes (Signed)
RT NOTE: RT transported patient from room 4N17 to MRI and back to room 4N17 with no apparent complications. MRI was unable to do patient's scans due to other outpatient appointments so patient will have to go back down later. Vitals are stable. RT will continue to monitor.

## 2019-06-20 NOTE — TOC Progression Note (Signed)
Transition of Care [facility]) - Progression Note    Patient Details  Name: Chase Caldwell MRN: [id number] Date of Birth: 10-Oct-1980  [facility] CM/SW Contact  Ella Bodo, RN Phone Number: 06/20/2019, 4:37 PM  Clinical Narrative:  Noted CM consult for LTAC vs. Vent SNF.  Pt unfortunately does not have payor source; if needs vent SNF, wife would need to apply for [place] with eventual transition to vent SNF.  Hopeful that pt's neuro status improves and he will be able to wean to trach collar.  Will check with financial counselor to make sure they are following.      Expected Discharge Plan: IP Rehab Facility Barriers to Discharge: Continued Medical Work up  Expected Discharge Plan and Services Expected Discharge Plan: [place]   Discharge Planning Services: CM Consult   Living arrangements for the past 2 months: Single Family Home                                       Social Determinants of Health (SDOH) Interventions    Readmission Risk Interventions No flowsheet data found.  Reinaldo Raddle, RN, BSN  Trauma/Neuro ICU Case Manager [phone]

## 2019-06-20 NOTE — Progress Notes (Signed)
RT NOTE: RT transported patient on ventilator from room 4N17 to MRI and back to room 4N17 with no apparent complications. Vitals are stable. RT will continue to monitor.
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
## 2019-06-21 ENCOUNTER — Encounter (HOSPITAL_COMMUNITY): Payer: Self-pay

## 2019-06-21 LAB — BASIC METABOLIC PANEL
Anion gap: 10 (ref 5–15)
BUN: 31 mg/dL — ABNORMAL HIGH (ref 6–20)
CO2: 23 mmol/L (ref 22–32)
Calcium: 8.3 mg/dL — ABNORMAL LOW (ref 8.9–10.3)
Chloride: 113 mmol/L — ABNORMAL HIGH (ref 98–111)
Creatinine, Ser: 0.79 mg/dL (ref 0.61–1.24)
GFR calc Af Amer: >60 mL/min (ref >60)
GFR calc non Af Amer: >60 mL/min (ref >60)
Glucose, Bld: 162 mg/dL — ABNORMAL HIGH (ref 70–99)
Potassium: 4.2 mmol/L (ref 3.5–5.1)
Sodium: 146 mmol/L — ABNORMAL HIGH (ref 135–145)

## 2019-06-21 LAB — CULTURE, BLOOD (ROUTINE X 2)
Culture: NO GROWTH
Culture: NO GROWTH
Special Requests: ADEQUATE
Special Requests: ADEQUATE

## 2019-06-21 LAB — GLUCOSE, CAPILLARY
Glucose-Capillary: 163 mg/dL — ABNORMAL HIGH (ref 70–99)
Glucose-Capillary: 165 mg/dL — ABNORMAL HIGH (ref 70–99)
Glucose-Capillary: 158 mg/dL — ABNORMAL HIGH (ref 70–99)
Glucose-Capillary: 157 mg/dL — ABNORMAL HIGH (ref 70–99)
Glucose-Capillary: 146 mg/dL — ABNORMAL HIGH (ref 70–99)
Glucose-Capillary: 151 mg/dL — ABNORMAL HIGH (ref 70–99)

## 2019-06-21 LAB — CBC
HCT: 32.4 % — ABNORMAL LOW (ref 39.0–52.0)
Hemoglobin: 9.8 g/dL — ABNORMAL LOW (ref 13.0–17.0)
MCH: 30.5 pg (ref 26.0–34.0)
MCHC: 30.2 g/dL (ref 30.0–36.0)
MCV: 100.9 fL — ABNORMAL HIGH (ref 80.0–100.0)
Platelets: 603 10*3/uL — ABNORMAL HIGH (ref 150–400)
RBC: 3.21 MIL/uL — ABNORMAL LOW (ref 4.22–5.81)
RDW: 14.5 % (ref 11.5–15.5)
WBC: 12.6 10*3/uL — ABNORMAL HIGH (ref 4.0–10.5)
nRBC: 0 % (ref 0.0–0.2)
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
## 2019-06-21 NOTE — Progress Notes (Signed)
Assisted tele visit to patient with family member.  Chase Caldwell P, RN  

## 2019-06-21 NOTE — Progress Notes (Signed)
RT NOTE: Patient placed on 40% ATC per MD. Patient tolerating well at this time. Vitals are stable. RT will continue to monitor.
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
## 2019-06-22 LAB — GLUCOSE, CAPILLARY
Glucose-Capillary: 132 mg/dL — ABNORMAL HIGH (ref 70–99)
Glucose-Capillary: 181 mg/dL — ABNORMAL HIGH (ref 70–99)
Glucose-Capillary: 134 mg/dL — ABNORMAL HIGH (ref 70–99)
Glucose-Capillary: 165 mg/dL — ABNORMAL HIGH (ref 70–99)
Glucose-Capillary: 108 mg/dL — ABNORMAL HIGH (ref 70–99)
Glucose-Capillary: 121 mg/dL — ABNORMAL HIGH (ref 70–99)

## 2019-06-22 MED ORDER — DULOXETINE HCL 60 MG PO CPEP
60.0000 mg | ORAL_CAPSULE | Freq: Every day | ORAL | Status: DC
  Administered 2019-06-22 – 2019-07-19 (×27): 60 mg via ORAL
  Filled 2019-06-22 (×26): qty 1

## 2019-06-22 MED ORDER — ACETAMINOPHEN 160 MG/5ML PO SOLN
500.0000 mg | Freq: Four times a day (QID) | ORAL | Status: DC
  Administered 2019-06-22 – 2019-07-13 (×81): 500 mg
  Filled 2019-06-22 (×82): qty 20.3

## 2019-06-22 NOTE — Progress Notes (Signed)
RT placed patient on 40% ATC. Patient is tolerating well at this time. No complications. RT will continue to monitor.
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
## 2019-06-22 NOTE — Progress Notes (Signed)
Patient is still on ATC, now at 35% and 8L flow.  Patient is tolerating ATC very well.  O2 sat at 98 to 100%.  Will continue to monitor patient's progress.

## 2019-06-23 LAB — GLUCOSE, CAPILLARY
Glucose-Capillary: 134 mg/dL — ABNORMAL HIGH (ref 70–99)
Glucose-Capillary: 126 mg/dL — ABNORMAL HIGH (ref 70–99)
Glucose-Capillary: 172 mg/dL — ABNORMAL HIGH (ref 70–99)
Glucose-Capillary: 119 mg/dL — ABNORMAL HIGH (ref 70–99)
Glucose-Capillary: 133 mg/dL — ABNORMAL HIGH (ref 70–99)

## 2019-06-23 LAB — CBC
HCT: 30.1 % — ABNORMAL LOW (ref 39.0–52.0)
Hemoglobin: 9.3 g/dL — ABNORMAL LOW (ref 13.0–17.0)
MCH: 30.2 pg (ref 26.0–34.0)
MCHC: 30.9 g/dL (ref 30.0–36.0)
MCV: 97.7 fL (ref 80.0–100.0)
Platelets: 614 10*3/uL — ABNORMAL HIGH (ref 150–400)
RBC: 3.08 MIL/uL — ABNORMAL LOW (ref 4.22–5.81)
RDW: 14 % (ref 11.5–15.5)
WBC: 12.1 10*3/uL — ABNORMAL HIGH (ref 4.0–10.5)
nRBC: 0 % (ref 0.0–0.2)

## 2019-06-23 LAB — BASIC METABOLIC PANEL
Anion gap: 11 (ref 5–15)
BUN: 22 mg/dL — ABNORMAL HIGH (ref 6–20)
CO2: 25 mmol/L (ref 22–32)
Calcium: 8.4 mg/dL — ABNORMAL LOW (ref 8.9–10.3)
Chloride: 107 mmol/L (ref 98–111)
Creatinine, Ser: 0.66 mg/dL (ref 0.61–1.24)
GFR calc Af Amer: >60 mL/min (ref >60)
GFR calc non Af Amer: >60 mL/min (ref >60)
Glucose, Bld: 138 mg/dL — ABNORMAL HIGH (ref 70–99)
Potassium: 3.4 mmol/L — ABNORMAL LOW (ref 3.5–5.1)
Sodium: 143 mmol/L (ref 135–145)

## 2019-06-23 MED ORDER — POTASSIUM CHLORIDE 20 MEQ PO PACK
40.0000 meq | PACK | Freq: Once | ORAL | Status: AC
  Administered 2019-06-23: 40 meq
  Filled 2019-06-23: qty 2

## 2019-06-23 MED ORDER — OXYCODONE HCL 5 MG/5ML PO SOLN
10.0000 mg | ORAL | Status: DC | PRN
  Administered 2019-06-23 – 2019-06-26 (×5): 15 mg
  Administered 2019-06-26: 10 mg
  Administered 2019-06-27 – 2019-06-29 (×8): 15 mg
  Administered 2019-06-30: 10 mg
  Administered 2019-06-30 – 2019-07-01 (×6): 15 mg
  Filled 2019-06-23 (×4): qty 15
  Filled 2019-06-23: qty 10
  Filled 2019-06-23 (×10): qty 15
  Filled 2019-06-23: qty 10
  Filled 2019-06-23 (×5): qty 15
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
## 2019-06-23 NOTE — Progress Notes (Signed)
Inpatient Rehab Admissions Coordinator:   Met with patient at bedside to discuss potential CIR admission. Pt. Unable to communicate, but his wife was at bedside and stated interest in CIR admit  Will pursue for potential admit when medically ready.  Clemens Catholic, [place], [place] Admissions Coordinator  [phone] ([place]) [phone] (office)
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
## 2019-06-23 NOTE — Progress Notes (Signed)
Assisted tele visit to patient with father.  Chase Caldwell, Janace Hoard, RN

## 2019-06-23 NOTE — Progress Notes (Signed)
RN informed this RT that patient's rate has been climbing some this morning.  Patient has been bouncing from 20s to lower 30s for me.  I asked RN to let me know if patient started sustaining rates in upper 30s.  Will continue to monitor.

## 2019-06-24 ENCOUNTER — Encounter (HOSPITAL_COMMUNITY): Payer: Self-pay

## 2019-06-24 DIAGNOSIS — W3400XA Accidental discharge from unspecified firearms or gun, initial encounter: Secondary | ICD-10-CM

## 2019-06-24 DIAGNOSIS — T1490XA Injury, unspecified, initial encounter: Secondary | ICD-10-CM

## 2019-06-24 DIAGNOSIS — S0183XA Puncture wound without foreign body of other part of head, initial encounter: Secondary | ICD-10-CM

## 2019-06-24 LAB — CBC
HCT: 30.4 % — ABNORMAL LOW (ref 39.0–52.0)
Hemoglobin: 9.4 g/dL — ABNORMAL LOW (ref 13.0–17.0)
MCH: 30.1 pg (ref 26.0–34.0)
MCHC: 30.9 g/dL (ref 30.0–36.0)
MCV: 97.4 fL (ref 80.0–100.0)
Platelets: 572 10*3/uL — ABNORMAL HIGH (ref 150–400)
RBC: 3.12 MIL/uL — ABNORMAL LOW (ref 4.22–5.81)
RDW: 14.1 % (ref 11.5–15.5)
WBC: 10 10*3/uL (ref 4.0–10.5)
nRBC: 0 % (ref 0.0–0.2)

## 2019-06-24 LAB — BASIC METABOLIC PANEL
Anion gap: 10 (ref 5–15)
BUN: 21 mg/dL — ABNORMAL HIGH (ref 6–20)
CO2: 23 mmol/L (ref 22–32)
Calcium: 8.5 mg/dL — ABNORMAL LOW (ref 8.9–10.3)
Chloride: 108 mmol/L (ref 98–111)
Creatinine, Ser: 0.53 mg/dL — ABNORMAL LOW (ref 0.61–1.24)
GFR calc Af Amer: >60 mL/min (ref >60)
GFR calc non Af Amer: >60 mL/min (ref >60)
Glucose, Bld: 122 mg/dL — ABNORMAL HIGH (ref 70–99)
Potassium: 4.3 mmol/L (ref 3.5–5.1)
Sodium: 141 mmol/L (ref 135–145)

## 2019-06-24 LAB — GLUCOSE, CAPILLARY
Glucose-Capillary: 114 mg/dL — ABNORMAL HIGH (ref 70–99)
Glucose-Capillary: 117 mg/dL — ABNORMAL HIGH (ref 70–99)
Glucose-Capillary: 119 mg/dL — ABNORMAL HIGH (ref 70–99)
Glucose-Capillary: 127 mg/dL — ABNORMAL HIGH (ref 70–99)
Glucose-Capillary: 131 mg/dL — ABNORMAL HIGH (ref 70–99)
Glucose-Capillary: 134 mg/dL — ABNORMAL HIGH (ref 70–99)
Glucose-Capillary: 143 mg/dL — ABNORMAL HIGH (ref 70–99)

## 2019-06-24 LAB — PHOSPHORUS: Phosphorus: 4.5 mg/dL (ref 2.5–4.6)

## 2019-06-24 LAB — MAGNESIUM: Magnesium: 2.4 mg/dL (ref 1.7–2.4)

## 2019-06-24 MED ORDER — JUVEN PO PACK
1.0000 | PACK | Freq: Two times a day (BID) | ORAL | Status: DC
  Administered 2019-06-24 – 2019-07-16 (×41): 1
  Filled 2019-06-24 (×41): qty 1
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
## 2019-06-25 LAB — CBC
HCT: 31.3 % — ABNORMAL LOW (ref 39.0–52.0)
Hemoglobin: 9.9 g/dL — ABNORMAL LOW (ref 13.0–17.0)
MCH: 30.1 pg (ref 26.0–34.0)
MCHC: 31.6 g/dL (ref 30.0–36.0)
MCV: 95.1 fL (ref 80.0–100.0)
Platelets: 699 10*3/uL — ABNORMAL HIGH (ref 150–400)
RBC: 3.29 MIL/uL — ABNORMAL LOW (ref 4.22–5.81)
RDW: 13.9 % (ref 11.5–15.5)
WBC: 11.4 10*3/uL — ABNORMAL HIGH (ref 4.0–10.5)
nRBC: 0 % (ref 0.0–0.2)

## 2019-06-25 LAB — BASIC METABOLIC PANEL
Anion gap: 12 (ref 5–15)
BUN: 20 mg/dL (ref 6–20)
CO2: 23 mmol/L (ref 22–32)
Calcium: 8.9 mg/dL (ref 8.9–10.3)
Chloride: 107 mmol/L (ref 98–111)
Creatinine, Ser: 0.61 mg/dL (ref 0.61–1.24)
GFR calc Af Amer: >60 mL/min (ref >60)
GFR calc non Af Amer: >60 mL/min (ref >60)
Glucose, Bld: 139 mg/dL — ABNORMAL HIGH (ref 70–99)
Potassium: 3.7 mmol/L (ref 3.5–5.1)
Sodium: 142 mmol/L (ref 135–145)

## 2019-06-25 LAB — GLUCOSE, CAPILLARY
Glucose-Capillary: 125 mg/dL — ABNORMAL HIGH (ref 70–99)
Glucose-Capillary: 131 mg/dL — ABNORMAL HIGH (ref 70–99)
Glucose-Capillary: 104 mg/dL — ABNORMAL HIGH (ref 70–99)
Glucose-Capillary: 129 mg/dL — ABNORMAL HIGH (ref 70–99)
Glucose-Capillary: 143 mg/dL — ABNORMAL HIGH (ref 70–99)

## 2019-06-25 LAB — MAGNESIUM: Magnesium: 2 mg/dL (ref 1.7–2.4)

## 2019-06-25 LAB — PHOSPHORUS: Phosphorus: 3.7 mg/dL (ref 2.5–4.6)

## 2019-06-25 MED ORDER — CLONAZEPAM 0.25 MG PO TBDP
0.2500 mg | ORAL_TABLET | Freq: Two times a day (BID) | ORAL | Status: DC
  Administered 2019-06-25 – 2019-06-26 (×3): 0.25 mg
  Filled 2019-06-25 (×2): qty 1

## 2019-06-25 MED ORDER — QUETIAPINE FUMARATE 50 MG PO TABS
50.0000 mg | ORAL_TABLET | Freq: Two times a day (BID) | ORAL | Status: DC
  Administered 2019-06-25 – 2019-06-28 (×7): 50 mg
  Filled 2019-06-25 (×2): qty 1
  Filled 2019-06-25: qty 2
  Filled 2019-06-25 (×4): qty 1

## 2019-06-25 MED ORDER — FENTANYL CITRATE (PF) 100 MCG/2ML IJ SOLN: 50.0000 ug | INTRAMUSCULAR | Status: DC | PRN

## 2019-06-25 MED ORDER — FREE WATER
200.0000 mL | Status: DC
  Administered 2019-06-25 – 2019-07-16 (×123): 200 mL

## 2019-06-25 MED ORDER — CLONAZEPAM 0.5 MG PO TABS: 0.2500 mg | ORAL_TABLET | Freq: Two times a day (BID) | ORAL | Status: DC

## 2019-06-25 MED ORDER — CLONAZEPAM 0.25 MG PO TBDP
0.2500 mg | ORAL_TABLET | Freq: Two times a day (BID) | ORAL | Status: DC
  Filled 2019-06-25: qty 1
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
## 2019-06-25 NOTE — Progress Notes (Signed)
Inpatient Rehabilitation Admissions Coordinator  Met patient at bedside after transfer to 4[facility]. No family present . I will follow up with wife to discuss his rehab venue options. Patient currently not at a level to tolerate CIR admit. I will follow his progress.  Danne Baxter, RN, MSN Rehab Admissions Coordinator [phone] 06/25/2019 6:53 PM

## 2019-06-26 ENCOUNTER — Other Ambulatory Visit: Payer: Self-pay

## 2019-06-26 ENCOUNTER — Encounter (HOSPITAL_COMMUNITY): Payer: Self-pay

## 2019-06-26 LAB — BASIC METABOLIC PANEL
Anion gap: 11 (ref 5–15)
BUN: 24 mg/dL — ABNORMAL HIGH (ref 6–20)
CO2: 24 mmol/L (ref 22–32)
Calcium: 8.7 mg/dL — ABNORMAL LOW (ref 8.9–10.3)
Chloride: 107 mmol/L (ref 98–111)
Creatinine, Ser: 0.7 mg/dL (ref 0.61–1.24)
GFR calc Af Amer: >60 mL/min (ref >60)
GFR calc non Af Amer: >60 mL/min (ref >60)
Glucose, Bld: 128 mg/dL — ABNORMAL HIGH (ref 70–99)
Potassium: 3.7 mmol/L (ref 3.5–5.1)
Sodium: 142 mmol/L (ref 135–145)

## 2019-06-26 LAB — GLUCOSE, CAPILLARY
Glucose-Capillary: 114 mg/dL — ABNORMAL HIGH (ref 70–99)
Glucose-Capillary: 116 mg/dL — ABNORMAL HIGH (ref 70–99)
Glucose-Capillary: 116 mg/dL — ABNORMAL HIGH (ref 70–99)
Glucose-Capillary: 118 mg/dL — ABNORMAL HIGH (ref 70–99)
Glucose-Capillary: 142 mg/dL — ABNORMAL HIGH (ref 70–99)
Glucose-Capillary: 143 mg/dL — ABNORMAL HIGH (ref 70–99)
Glucose-Capillary: 146 mg/dL — ABNORMAL HIGH (ref 70–99)
Glucose-Capillary: 91 mg/dL (ref 70–99)

## 2019-06-26 LAB — CBC
HCT: 30.3 % — ABNORMAL LOW (ref 39.0–52.0)
Hemoglobin: 9.6 g/dL — ABNORMAL LOW (ref 13.0–17.0)
MCH: 29.9 pg (ref 26.0–34.0)
MCHC: 31.7 g/dL (ref 30.0–36.0)
MCV: 94.4 fL (ref 80.0–100.0)
Platelets: 653 10*3/uL — ABNORMAL HIGH (ref 150–400)
RBC: 3.21 MIL/uL — ABNORMAL LOW (ref 4.22–5.81)
RDW: 13.9 % (ref 11.5–15.5)
WBC: 10.8 10*3/uL — ABNORMAL HIGH (ref 4.0–10.5)
nRBC: 0 % (ref 0.0–0.2)

## 2019-06-26 LAB — MAGNESIUM: Magnesium: 2.1 mg/dL (ref 1.7–2.4)

## 2019-06-26 LAB — PHOSPHORUS: Phosphorus: 3.5 mg/dL (ref 2.5–4.6)

## 2019-06-26 MED ORDER — CLONAZEPAM 0.125 MG PO TBDP
0.1250 mg | ORAL_TABLET | Freq: Two times a day (BID) | ORAL | Status: DC
  Administered 2019-06-26 – 2019-06-28 (×4): 0.125 mg
  Filled 2019-06-26 (×4): qty 1
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
## 2019-06-26 NOTE — Progress Notes (Addendum)
Inpatient Rehabilitation Admissions Coordinator  I contacted pt's wife by phone and arranged to meet Friday at 1030 to discuss his rehab venue plans.  Danne Baxter, RN, MSN Rehab Admissions Coordinator [phone] 06/26/2019 4:16 PM
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
## 2019-06-26 NOTE — Consult Note (Signed)
Psychiatry consult attempted by nurse practitioner.  Patient currently nonverbal per attending RN.  Patient appears unable to participate in psychiatry assessment at this time.  Attempted to communicate with patient using head nods, patient currently unable to participate meaningfully in psychiatric assessment. Please re-consult psychiatry as needed.
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
## 2019-06-26 NOTE — Progress Notes (Signed)
RT NOTE: RT ordered trach supplies for trach change per MD at 12:00 with secretary on 4NP. Supplies still have not been sent up. RT called [employer] materials management but cannot get anyone to answer the phone to try to locate supplies. RT will try to call them again to get supplies so trach can be changed.
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
## 2019-06-27 LAB — GLUCOSE, CAPILLARY
Glucose-Capillary: 120 mg/dL — ABNORMAL HIGH (ref 70–99)
Glucose-Capillary: 146 mg/dL — ABNORMAL HIGH (ref 70–99)
Glucose-Capillary: 141 mg/dL — ABNORMAL HIGH (ref 70–99)
Glucose-Capillary: 153 mg/dL — ABNORMAL HIGH (ref 70–99)
Glucose-Capillary: 121 mg/dL — ABNORMAL HIGH (ref 70–99)

## 2019-06-27 NOTE — Progress Notes (Signed)
Trach change from 7.0 cuff to 7.0 uncuffed trach. No noted respiratory issues Color change on ETCO2 and bilateral BS

## 2019-06-27 NOTE — Progress Notes (Signed)
Occupational Therapy Note  Chart reviewed.  Pt now appropriate for post acute.  Recommend CIR as activity tolerance improves.     [name]., OTR/L Acute Rehabilitation Services Pager [phone] Office [phone]
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
## 2019-06-28 LAB — GLUCOSE, CAPILLARY
Glucose-Capillary: 108 mg/dL — ABNORMAL HIGH (ref 70–99)
Glucose-Capillary: 120 mg/dL — ABNORMAL HIGH (ref 70–99)
Glucose-Capillary: 121 mg/dL — ABNORMAL HIGH (ref 70–99)
Glucose-Capillary: 125 mg/dL — ABNORMAL HIGH (ref 70–99)
Glucose-Capillary: 140 mg/dL — ABNORMAL HIGH (ref 70–99)
Glucose-Capillary: 150 mg/dL — ABNORMAL HIGH (ref 70–99)

## 2019-06-28 MED ORDER — OSMOLITE 1.5 CAL PO LIQD
1000.0000 mL | ORAL | Status: AC
  Administered 2019-06-28 – 2019-07-16 (×14): 1000 mL
  Filled 2019-06-28 (×29): qty 1000

## 2019-06-28 MED ORDER — QUETIAPINE FUMARATE 50 MG PO TABS
25.0000 mg | ORAL_TABLET | Freq: Two times a day (BID) | ORAL | Status: DC
  Administered 2019-06-28 – 2019-07-01 (×6): 25 mg
  Filled 2019-06-28 (×6): qty 1

## 2019-06-28 MED ORDER — PRO-STAT SUGAR FREE PO LIQD
30.0000 mL | Freq: Three times a day (TID) | ORAL | Status: DC
  Administered 2019-06-28 – 2019-07-16 (×51): 30 mL
  Filled 2019-06-28 (×50): qty 30

## 2019-06-28 NOTE — Progress Notes (Signed)
Inpatient Rehabilitation Admissions Coordinator  I met with pt's wife at bedside to discuss rehab goals and expectations We await his progress to be able to possibly tolerate more intense therapies. She is in agreement. I will follow his progress.  Danne Baxter, RN, MSN Rehab Admissions Coordinator [phone] 06/28/2019 1:24 PM
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
## 2019-06-29 LAB — GLUCOSE, CAPILLARY
Glucose-Capillary: 123 mg/dL — ABNORMAL HIGH (ref 70–99)
Glucose-Capillary: 126 mg/dL — ABNORMAL HIGH (ref 70–99)
Glucose-Capillary: 128 mg/dL — ABNORMAL HIGH (ref 70–99)
Glucose-Capillary: 129 mg/dL — ABNORMAL HIGH (ref 70–99)
Glucose-Capillary: 130 mg/dL — ABNORMAL HIGH (ref 70–99)
Glucose-Capillary: 136 mg/dL — ABNORMAL HIGH (ref 70–99)
Glucose-Capillary: 143 mg/dL — ABNORMAL HIGH (ref 70–99)

## 2019-06-29 NOTE — Progress Notes (Signed)
While patient's RN was on break, this RN responded to the patient's request for a wet mouth swab. While performing oral care, one of the patient's right upper molars fell out. The tooth was removed from the patient's mouth and placed in a container on the bedside table. The patient does not appear to be in any distress or pain.The patient's RN was updated upon her return to the unit.

## 2019-06-29 NOTE — Plan of Care (Signed)
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
## 2019-06-30 LAB — GLUCOSE, CAPILLARY
Glucose-Capillary: 134 mg/dL — ABNORMAL HIGH (ref 70–99)
Glucose-Capillary: 150 mg/dL — ABNORMAL HIGH (ref 70–99)
Glucose-Capillary: 141 mg/dL — ABNORMAL HIGH (ref 70–99)
Glucose-Capillary: 132 mg/dL — ABNORMAL HIGH (ref 70–99)
Glucose-Capillary: 118 mg/dL — ABNORMAL HIGH (ref 70–99)
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
## 2019-07-01 LAB — GLUCOSE, CAPILLARY
Glucose-Capillary: 122 mg/dL — ABNORMAL HIGH (ref 70–99)
Glucose-Capillary: 127 mg/dL — ABNORMAL HIGH (ref 70–99)
Glucose-Capillary: 124 mg/dL — ABNORMAL HIGH (ref 70–99)
Glucose-Capillary: 115 mg/dL — ABNORMAL HIGH (ref 70–99)
Glucose-Capillary: 124 mg/dL — ABNORMAL HIGH (ref 70–99)
Glucose-Capillary: 119 mg/dL — ABNORMAL HIGH (ref 70–99)

## 2019-07-01 MED ORDER — QUETIAPINE FUMARATE 50 MG PO TABS
25.0000 mg | ORAL_TABLET | Freq: Every day | ORAL | Status: DC
  Administered 2019-07-02 – 2019-07-04 (×3): 25 mg
  Filled 2019-07-01 (×3): qty 1

## 2019-07-01 MED ORDER — MIDAZOLAM HCL 2 MG/2ML IJ SOLN
2.0000 mg | Freq: Three times a day (TID) | INTRAMUSCULAR | Status: DC | PRN
  Administered 2019-07-08 – 2019-07-17 (×6): 2 mg via INTRAVENOUS
  Filled 2019-07-01 (×7): qty 2

## 2019-07-01 MED ORDER — SODIUM CHLORIDE 0.9% FLUSH
10.0000 mL | INTRAVENOUS | Status: DC | PRN
  Administered 2019-07-02: 10 mL
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
## 2019-07-01 NOTE — Progress Notes (Signed)
Patient is having trouble keeping an IV in due to increased sweating. I spoke with [facility] PA and she advised to leave IV out until further instruction.
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
## 2019-07-01 NOTE — Procedures (Signed)
Tracheostomy Change Note  Patient Details:   Name: Chase Caldwell DOB: Jul 06, 1980 MRN: [id number]    Airway Documentation:     Evaluation  O2 sats: stable throughout Complications: No apparent complications Patient did tolerate procedure well. Bilateral Breath Sounds: Clear, Diminished   RT along with RRT Katie Scotton changed patient's #7 Shiley XLT proximal cuffless to #6 Shiley XLT proximal cuffless  Per order with no apparent complications. Positive color change on ETCO2 detector and BBS noted. Vitals are stable. RT will continue to monitor.     [name] 07/01/2019, 4:03 PM
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
## 2019-07-02 ENCOUNTER — Inpatient Hospital Stay (HOSPITAL_COMMUNITY): Payer: Medicaid Other

## 2019-07-02 LAB — GLUCOSE, CAPILLARY
Glucose-Capillary: 109 mg/dL — ABNORMAL HIGH (ref 70–99)
Glucose-Capillary: 119 mg/dL — ABNORMAL HIGH (ref 70–99)
Glucose-Capillary: 120 mg/dL — ABNORMAL HIGH (ref 70–99)
Glucose-Capillary: 120 mg/dL — ABNORMAL HIGH (ref 70–99)
Glucose-Capillary: 136 mg/dL — ABNORMAL HIGH (ref 70–99)
Glucose-Capillary: 140 mg/dL — ABNORMAL HIGH (ref 70–99)

## 2019-07-02 MED ORDER — METOPROLOL TARTRATE 25 MG/10 ML ORAL SUSPENSION
25.0000 mg | Freq: Two times a day (BID) | ORAL | Status: DC
  Administered 2019-07-02 – 2019-07-19 (×34): 25 mg
  Filled 2019-07-02 (×35): qty 10

## 2019-07-02 MED ORDER — CLONAZEPAM 0.5 MG PO TBDP
0.5000 mg | ORAL_TABLET | Freq: Two times a day (BID) | ORAL | Status: DC
  Administered 2019-07-02 – 2019-07-11 (×19): 0.5 mg
  Filled 2019-07-02 (×3): qty 1
  Filled 2019-07-02 (×2): qty 4
  Filled 2019-07-02: qty 1
  Filled 2019-07-02 (×2): qty 4
  Filled 2019-07-02 (×2): qty 1
  Filled 2019-07-02: qty 4
  Filled 2019-07-02 (×3): qty 1
  Filled 2019-07-02: qty 4
  Filled 2019-07-02 (×4): qty 1
  Filled 2019-07-02: qty 4

## 2019-07-02 MED ORDER — TRAMADOL HCL 50 MG PO TABS
25.0000 mg | ORAL_TABLET | Freq: Four times a day (QID) | ORAL | Status: DC | PRN
  Administered 2019-07-10 – 2019-07-15 (×2): 25 mg
  Filled 2019-07-02: qty 1

## 2019-07-02 MED ORDER — TRAMADOL HCL 50 MG PO TABS
50.0000 mg | ORAL_TABLET | Freq: Four times a day (QID) | ORAL | Status: DC | PRN
  Administered 2019-07-02 – 2019-07-19 (×31): 50 mg
  Filled 2019-07-02 (×32): qty 1
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
## 2019-07-02 NOTE — TOC Progression Note (Signed)
Transition of Care [facility]) - Progression Note    Patient Details  Name: Chase Caldwell MRN: [id number] Date of Birth: [birth year]  Transition of Care [facility]) CM/SW Contact  Ella Bodo, RN Phone Number: 07/02/2019, 4:11 PM  Clinical Narrative:  [facility] Case Manager continues to follow/assist with disposition:  Currently, IP Rehab admissions coordinator continuing to follow patient progress with therapies.  Pt making good progress over last week with speech; plan CIR admission when able to make further progress with mobility.  Will follow.      Expected Discharge Plan: IP Rehab Facility Barriers to Discharge: Continued Medical Work up  Expected Discharge Plan and Services Expected Discharge Plan: [place]   Discharge Planning Services: CM Consult   Living arrangements for the past 2 months: Single Family Home                                       Social Determinants of Health (SDOH) Interventions    Readmission Risk Interventions No flowsheet data found.  Reinaldo Raddle, RN, BSN  Trauma/Neuro ICU Case Manager [phone]

## 2019-07-02 NOTE — Progress Notes (Signed)
Orthopedic Tech Progress Note Patient Details:  Chase Caldwell [birth year] [id number] Went to put [facility] on patient bilaterally. The right foot would not sit in boot good. Left foot was ok. Nurse was in the room at the time. Ortho Devices Type of Ortho Device: Prafo boot/shoe Ortho Device/Splint Location: BLE Ortho Device/Splint Interventions: Ordered, Application   Post Interventions Patient Tolerated: Well, Unable to use device properly Instructions Provided: Adjustment of device, Care of device   Shameer Molstad 07/02/2019, 4:51 PM
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
## 2019-07-02 NOTE — Progress Notes (Signed)
Modified Barium Swallow Progress Note  Patient Details  Name: Chase Caldwell MRN: [id number] Date of Birth: 19-Jun-1980  Today's Date: 07/02/2019  Modified Barium Swallow completed.  Full report located under Chart Review in the Imaging Section.  Brief recommendations include the following:  Clinical Impression  Pt demosntrates a severe oral dysphagia due to reduced lingual control and weakness, particularly of the anterior and body of the tongue. Pt has prolonged groping struggle to form a bolus for adequate propulsion. With max cues to seal his lips and push his tongue to the palate he was stimulable x2 to propel a puree bolus with neutral head position, but could not repeat. He is able to take consecutive straw sips of thin liquids, but has poor bolus control and liquids often fall deeply into pyriforms to initaite swallow. There is severe oral residue with thin that was silently aspirated post swallow x1. Pt seems to have the best control of nectar thick liquids and can utilize a posterior head tilt with nectar with less chance of aspiration before or after the swallow. He should practice purees with therapy, but is not ready to attempt purees with staff as it requires cueing and positioning and also worsens pts coordination. Will initiate nectar thick liquids via cup or straw.    Swallow Evaluation Recommendations       SLP Diet Recommendations: Nectar thick liquid       Medication Administration: Via alternative means                      Herbie Baltimore, MA CCC-SLP  Acute Rehabilitation Services Pager [phone] Office [phone]   Lynann Beaver 07/02/2019,1:05 PM

## 2019-07-03 LAB — GLUCOSE, CAPILLARY
Glucose-Capillary: 110 mg/dL — ABNORMAL HIGH (ref 70–99)
Glucose-Capillary: 120 mg/dL — ABNORMAL HIGH (ref 70–99)
Glucose-Capillary: 125 mg/dL — ABNORMAL HIGH (ref 70–99)
Glucose-Capillary: 128 mg/dL — ABNORMAL HIGH (ref 70–99)
Glucose-Capillary: 129 mg/dL — ABNORMAL HIGH (ref 70–99)
Glucose-Capillary: 136 mg/dL — ABNORMAL HIGH (ref 70–99)
Glucose-Capillary: 94 mg/dL (ref 70–99)

## 2019-07-03 MED ORDER — SUCCINYLCHOLINE CHLORIDE 200 MG/10ML IV SOSY
PREFILLED_SYRINGE | INTRAVENOUS | Status: AC
  Filled 2019-07-03: qty 10

## 2019-07-03 MED ORDER — RESOURCE THICKENUP CLEAR PO POWD
ORAL | Status: DC | PRN
  Filled 2019-07-03 (×2): qty 125

## 2019-07-03 MED ORDER — VECURONIUM BROMIDE 10 MG IV SOLR
INTRAVENOUS | Status: AC
  Filled 2019-07-03: qty 10

## 2019-07-03 MED ORDER — FENTANYL CITRATE (PF) 100 MCG/2ML IJ SOLN
INTRAMUSCULAR | Status: AC
  Filled 2019-07-03: qty 2

## 2019-07-03 MED ORDER — PROPOFOL 10 MG/ML IV BOLUS
INTRAVENOUS | Status: AC
  Filled 2019-07-03: qty 20

## 2019-07-03 MED ORDER — STERILE WATER FOR INJECTION IJ SOLN
INTRAMUSCULAR | Status: AC
  Filled 2019-07-03: qty 10

## 2019-07-03 MED ORDER — ETOMIDATE 2 MG/ML IV SOLN
INTRAVENOUS | Status: AC
  Filled 2019-07-03: qty 20

## 2019-07-03 MED ORDER — MIDAZOLAM HCL 2 MG/2ML IJ SOLN
INTRAMUSCULAR | Status: AC
  Filled 2019-07-03: qty 4

## 2019-07-03 MED ORDER — ROCURONIUM BROMIDE 10 MG/ML (PF) SYRINGE
PREFILLED_SYRINGE | INTRAVENOUS | Status: AC
  Filled 2019-07-03: qty 10
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
## 2019-07-03 NOTE — Consult Note (Addendum)
  Patient reassessed by nurse practitioner.  Patient alert and oriented, participates and assessment.  Patient presents with slurred speech.  Patient gives verbal consent for wife, Starla Link, to remain at bedside during assessment. Patient states "I think I am okay but I am not sleeping well at all." Patient denies suicidal ideations.  Patient also denies homicidal ideations, denies auditory and visual hallucinations. Patient discussed with Dr. Dwyane Dee.   Recommendations: -Recommend consider increase Seroquel from 25 mg nightly to 50 mg nightly to address reports of insomnia. Ordered EKG for evaluation.   -Continue to recommend inpatient psychiatric treatment.

## 2019-07-03 NOTE — Progress Notes (Signed)
Inpatient Rehabilitation Admissions Coordinator  I met with patient, wife and Dr. Bobbye Morton at bedside. I await psych plan of care as Dr. Bobbye Morton arranges before proceeding.  Danne Baxter, RN, MSN Rehab Admissions Coordinator [phone] 07/03/2019 10:42 AM
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
## 2019-07-04 LAB — GLUCOSE, CAPILLARY
Glucose-Capillary: 106 mg/dL — ABNORMAL HIGH (ref 70–99)
Glucose-Capillary: 110 mg/dL — ABNORMAL HIGH (ref 70–99)
Glucose-Capillary: 116 mg/dL — ABNORMAL HIGH (ref 70–99)
Glucose-Capillary: 130 mg/dL — ABNORMAL HIGH (ref 70–99)
Glucose-Capillary: 131 mg/dL — ABNORMAL HIGH (ref 70–99)
Glucose-Capillary: 79 mg/dL (ref 70–99)
Glucose-Capillary: 99 mg/dL (ref 70–99)
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
## 2019-07-04 NOTE — Progress Notes (Signed)
Order received to decannulate. 6.0 xlt shiley trach removed and occlusive dressing placed over stoma site. No complications noted, pt. Tolerated well.
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
## 2019-07-04 NOTE — Progress Notes (Signed)
O2 sats were at 100%, RN noticed Trach slightly out, called RT, came to bedside and tightened trach collar. No signs of distress. Will continue to monitor.

## 2019-07-05 LAB — BASIC METABOLIC PANEL
Anion gap: 12 (ref 5–15)
BUN: 19 mg/dL (ref 6–20)
CO2: 24 mmol/L (ref 22–32)
Calcium: 8.7 mg/dL — ABNORMAL LOW (ref 8.9–10.3)
Chloride: 101 mmol/L (ref 98–111)
Creatinine, Ser: 0.69 mg/dL (ref 0.61–1.24)
GFR calc Af Amer: >60 mL/min (ref >60)
GFR calc non Af Amer: >60 mL/min (ref >60)
Glucose, Bld: 112 mg/dL — ABNORMAL HIGH (ref 70–99)
Potassium: 3.7 mmol/L (ref 3.5–5.1)
Sodium: 137 mmol/L (ref 135–145)

## 2019-07-05 LAB — GLUCOSE, CAPILLARY
Glucose-Capillary: 100 mg/dL — ABNORMAL HIGH (ref 70–99)
Glucose-Capillary: 103 mg/dL — ABNORMAL HIGH (ref 70–99)
Glucose-Capillary: 104 mg/dL — ABNORMAL HIGH (ref 70–99)
Glucose-Capillary: 109 mg/dL — ABNORMAL HIGH (ref 70–99)
Glucose-Capillary: 114 mg/dL — ABNORMAL HIGH (ref 70–99)
Glucose-Capillary: 123 mg/dL — ABNORMAL HIGH (ref 70–99)

## 2019-07-05 LAB — CBC
HCT: 35.5 % — ABNORMAL LOW (ref 39.0–52.0)
Hemoglobin: 11.3 g/dL — ABNORMAL LOW (ref 13.0–17.0)
MCH: 29.7 pg (ref 26.0–34.0)
MCHC: 31.8 g/dL (ref 30.0–36.0)
MCV: 93.2 fL (ref 80.0–100.0)
Platelets: 395 10*3/uL (ref 150–400)
RBC: 3.81 MIL/uL — ABNORMAL LOW (ref 4.22–5.81)
RDW: 13.6 % (ref 11.5–15.5)
WBC: 8 10*3/uL (ref 4.0–10.5)
nRBC: 0 % (ref 0.0–0.2)

## 2019-07-05 LAB — MAGNESIUM: Magnesium: 2.1 mg/dL (ref 1.7–2.4)

## 2019-07-05 LAB — PHOSPHORUS: Phosphorus: 4.2 mg/dL (ref 2.5–4.6)

## 2019-07-05 MED ORDER — SERTRALINE HCL 50 MG PO TABS
25.0000 mg | ORAL_TABLET | Freq: Every day | ORAL | Status: DC
  Administered 2019-07-05 – 2019-07-09 (×5): 25 mg
  Filled 2019-07-05 (×5): qty 1

## 2019-07-05 MED ORDER — QUETIAPINE FUMARATE 50 MG PO TABS
25.0000 mg | ORAL_TABLET | Freq: Two times a day (BID) | ORAL | Status: DC
  Administered 2019-07-05 – 2019-07-11 (×12): 25 mg
  Filled 2019-07-05 (×12): qty 1
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
## 2019-07-05 NOTE — Plan of Care (Signed)
  Problem: Clinical Measurements: Goal: Respiratory complications will improve Outcome: Progressing   Problem: Clinical Measurements: Goal: Cardiovascular complication will be avoided Outcome: Progressing   Problem: Nutrition: Goal: Adequate nutrition will be maintained Outcome: Progressing   
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
## 2019-07-06 LAB — GLUCOSE, CAPILLARY
Glucose-Capillary: 106 mg/dL — ABNORMAL HIGH (ref 70–99)
Glucose-Capillary: 114 mg/dL — ABNORMAL HIGH (ref 70–99)
Glucose-Capillary: 114 mg/dL — ABNORMAL HIGH (ref 70–99)
Glucose-Capillary: 122 mg/dL — ABNORMAL HIGH (ref 70–99)
Glucose-Capillary: 136 mg/dL — ABNORMAL HIGH (ref 70–99)
Glucose-Capillary: 97 mg/dL (ref 70–99)
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
## 2019-07-07 LAB — GLUCOSE, CAPILLARY
Glucose-Capillary: 106 mg/dL — ABNORMAL HIGH (ref 70–99)
Glucose-Capillary: 121 mg/dL — ABNORMAL HIGH (ref 70–99)
Glucose-Capillary: 114 mg/dL — ABNORMAL HIGH (ref 70–99)
Glucose-Capillary: 109 mg/dL — ABNORMAL HIGH (ref 70–99)
Glucose-Capillary: 123 mg/dL — ABNORMAL HIGH (ref 70–99)

## 2019-07-07 MED ORDER — LORAZEPAM 2 MG/ML IJ SOLN
1.0000 mg | Freq: Once | INTRAMUSCULAR | Status: AC
  Administered 2019-07-07: 1 mg via INTRAVENOUS
  Filled 2019-07-07: qty 1

## 2019-07-07 NOTE — Progress Notes (Signed)
Patient received 1 mg Ativan IV and is resting well now with no yelling, foul language, or kicking.

## 2019-07-07 NOTE — Progress Notes (Signed)
Text sent to Dr Donne Hazel with patient yelling and kicking saying he wants out then cries, distributing the others on the unit. Order rec.
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
## 2019-07-08 LAB — GLUCOSE, CAPILLARY
Glucose-Capillary: 102 mg/dL — ABNORMAL HIGH (ref 70–99)
Glucose-Capillary: 112 mg/dL — ABNORMAL HIGH (ref 70–99)
Glucose-Capillary: 113 mg/dL — ABNORMAL HIGH (ref 70–99)
Glucose-Capillary: 115 mg/dL — ABNORMAL HIGH (ref 70–99)
Glucose-Capillary: 120 mg/dL — ABNORMAL HIGH (ref 70–99)
Glucose-Capillary: 125 mg/dL — ABNORMAL HIGH (ref 70–99)
Glucose-Capillary: 90 mg/dL (ref 70–99)

## 2019-07-08 MED ORDER — ORAL CARE MOUTH RINSE
15.0000 mL | Freq: Two times a day (BID) | OROMUCOSAL | Status: DC
  Administered 2019-07-09 – 2019-07-19 (×21): 15 mL via OROMUCOSAL

## 2019-07-08 MED ORDER — CHLORHEXIDINE GLUCONATE 0.12 % MT SOLN
15.0000 mL | Freq: Two times a day (BID) | OROMUCOSAL | Status: DC
  Administered 2019-07-09 – 2019-07-19 (×20): 15 mL via OROMUCOSAL
  Filled 2019-07-08 (×19): qty 15
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
## 2019-07-08 NOTE — Progress Notes (Signed)
Inpatient Rehab Admissions Coordinator:   Following for my colleague, Danne Baxter. I met with patient at the bedside.  Have discussed with Dr. Naaman Plummer and we can potentially admit pt with close pysch follow on CIR with plan to d/c to inpatient psych facility or home with family pending clearance.  I have no beds available for this patient to admit today.  Pamala Hurry will follow up with patient tomorrow.   Shann Medal, PT, DPT Admissions Coordinator [phone] 07/08/19  12:30 PM

## 2019-07-08 NOTE — Progress Notes (Signed)
We really need to assess the needs for more behavioral medications.  He is very disruptive to the unit. He yelled, cried and moaned all night.
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
## 2019-07-09 LAB — GLUCOSE, CAPILLARY
Glucose-Capillary: 100 mg/dL — ABNORMAL HIGH (ref 70–99)
Glucose-Capillary: 95 mg/dL (ref 70–99)
Glucose-Capillary: 124 mg/dL — ABNORMAL HIGH (ref 70–99)
Glucose-Capillary: 114 mg/dL — ABNORMAL HIGH (ref 70–99)
Glucose-Capillary: 103 mg/dL — ABNORMAL HIGH (ref 70–99)

## 2019-07-09 MED ORDER — SERTRALINE HCL 50 MG PO TABS
50.0000 mg | ORAL_TABLET | Freq: Every day | ORAL | Status: DC
  Administered 2019-07-10 – 2019-07-19 (×10): 50 mg
  Filled 2019-07-09 (×11): qty 1

## 2019-07-09 MED ORDER — LABETALOL HCL 5 MG/ML IV SOLN
10.0000 mg | INTRAVENOUS | Status: DC | PRN
  Administered 2019-07-09: 10 mg via INTRAVENOUS
  Filled 2019-07-09: qty 4
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
## 2019-07-09 NOTE — Progress Notes (Signed)
Chart reviewed and case discussed with Dr. Dwyane Dee. Patient will require biweekly consults while completing inpatient rehab. After completing rehab patient will be evaluated to determine the need for inpatient psychiatric admission. It is possible to get him stabilized in the hospital. If patient continues to meet inpatient criteria after rehab, will refer to [facility] or [facility] for admission pending bed availability. If no appropriate beds are available patient will be referred out of system to other hospitals for ongoing psychiatric management. 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 Patient may be a great candidate for TBI clinic at [facility]. WIll need to place new consult for this patient once he is admitted to inpatient rehab.

## 2019-07-09 NOTE — Progress Notes (Signed)
Inpatient Rehabilitation Admissions Coordinator  Discussed with Dr. Naaman Plummer. Contacted Almyra Free with TOC team. Please refer to his Consult note. I will follow at a distance.  Danne Baxter, RN, MSN Rehab Admissions Coordinator [phone] 07/09/2019 2:21 PM

## 2019-07-09 NOTE — Progress Notes (Signed)
[facility] PHYSICAL MEDICINE AND REHABILITATION  CONSULT SERVICE NOTE   Chart reviewed at length. Pt continues to demonstrate significant behavioral issues today. Interestingly, his imaging does not indicate any significant intracranial injury except for a small inferior frontal contusion near the orbital roof on the 5/30 head CT. It is possible that we are dealing with more of his inherent psychiatric disease than true "TBI" behavior.   I had the chance to speak with Dr. Dwyane Dee and Dr. Bobbye Morton today. Psychiatry is willing to follow patient on inpatient rehab but is unable to guarantee admission to [facility] when rehab is complete. Furthermore, if the patient has any functional/physical/medical needs, he's not a candidate for [facility] either.   We will follow along for now and please let me know if you have any questions.  Just an FYI, we too have a TBI clinic at [facility]. We see all inpatient follow-ups and community referrals there.   Meredith Staggers, MD, [facility] Director Rehab Services and Brain Injury Program 07/09/2019

## 2019-07-09 NOTE — Progress Notes (Signed)
bp noted at 125/65,  Medication effective

## 2019-07-09 NOTE — Plan of Care (Signed)
  Problem: Education: Goal: Knowledge of General Education information will improve Description Including pain rating scale, medication(s)/side effects and non-pharmacologic comfort measures Outcome: Progressing   

## 2019-07-09 NOTE — Progress Notes (Signed)
Pt bp is noted at 146/107,  Reached out to the team and made them aware.  meds were prescribed and given. Will reassess the bp 27min after the adm of the medication

## 2019-07-10 LAB — GLUCOSE, CAPILLARY
Glucose-Capillary: 104 mg/dL — ABNORMAL HIGH (ref 70–99)
Glucose-Capillary: 105 mg/dL — ABNORMAL HIGH (ref 70–99)
Glucose-Capillary: 105 mg/dL — ABNORMAL HIGH (ref 70–99)
Glucose-Capillary: 111 mg/dL — ABNORMAL HIGH (ref 70–99)
Glucose-Capillary: 111 mg/dL — ABNORMAL HIGH (ref 70–99)
Glucose-Capillary: 118 mg/dL — ABNORMAL HIGH (ref 70–99)
Glucose-Capillary: 119 mg/dL — ABNORMAL HIGH (ref 70–99)

## 2019-07-10 NOTE — Progress Notes (Addendum)
Patient complained of chest pain and we did an EKG and read normal at this time

## 2019-07-10 NOTE — Progress Notes (Signed)
SLP Cancellation Note  Patient Details Name: Chase Caldwell MRN: [id number] DOB: [birth year]   Cancelled treatment:       Reason Eval/Treat Not Completed: Other (comment) Pt working with PT/OT at the moment. Will f/u as able.    Osie Bond., M.A. [place] Acute Rehabilitation Services Pager [phone] Office [phone]  07/10/2019, 2:34 PM
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
## 2019-07-10 NOTE — TOC Progression Note (Signed)
Transition of Care [facility]) - Progression Note    Patient Details  Name: Jocsan Mcginley MRN: [id number] Date of Birth: 02/24/1980  [facility]) CM/SW Contact  Oren Section Cleta Alberts, RN Phone Number: 07/10/2019, 2:10 PM  Clinical Narrative:   [facility] Case Manager has received the three required "no's" required for referral to [place] from [facility], [place], and [place] with pt's wife, Roland Earl; explained discharge plan would be to focus on inpatient psych, with referral to [facility] in [place] first.  Could look into [place], or [facility] psych units, but may be difficult with pt's uninsured status.  She understands process; feels that pt needs to be getting rehab, and that he does not understand our processes.  She is awaiting Ethics Committee consult, as per her request, to help with these issues.    Referral faxed to [facility] in [place].  Will follow up with [place] Intake Charge nurse within 24hrs to assure that they have received referral.  Will provide updates as available.     Expected Discharge Plan: Psychiatric Hospital Barriers to Discharge: Psych Bed not available  Expected Discharge Plan and Services Expected Discharge Plan: [facility]   Discharge Planning Services: CM Consult   Living arrangements for the past 2 months: Single Family Home                                       Social Determinants of Health (SDOH) Interventions    Readmission Risk Interventions No flowsheet data found.  Reinaldo Raddle, RN, BSN  Trauma/Neuro ICU Case Manager [phone]
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
## 2019-07-10 NOTE — Progress Notes (Signed)
   07/10/19 0900  Clinical Encounter Type  Visited With Family (Wife)  Visit Type Initial;Psychological support;Spiritual support  Referral From Chaplain;Other (Comment) (Chaplain at [facility] where Wife works )  Consult/Referral To [employer]  Spiritual Encounters  Spiritual Needs Prayer;Emotional;Other (Comment) (Spiritual Care Conversation/Support)  Stress Factors  Patient Stress Factors Not reviewed  Family Stress Factors Health changes;Lack of knowledge;Other (Comment) (Not Feeling Heard or Supported )   I spoke with Chase Caldwell, [name]'s wife, today per referral from the Chaplain at the hospital where she works. Chase Caldwell is having a hard time with her husband's current medical care and does not feel "heard" by the medical team. Chase Caldwell does not feel that her husband is getting the care that he needs and is hoping that the ethics consult will help get him the care that he needs.  I provided spiritual care and prayer for Chase Caldwell. She is also open to receiving support from one of the [facility], as I am at [employer]. I sent over the referral to one of my colleagues for follow-up support.   Please, contact Spiritual Care for further assistance.   Chaplain Shanon Ace M.Div., [facility]
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
## 2019-07-11 ENCOUNTER — Inpatient Hospital Stay (HOSPITAL_COMMUNITY): Payer: Medicaid Other

## 2019-07-11 HISTORY — PX: IR REPLC GASTRO/COLONIC TUBE PERCUT W/FLUORO: IMG2333

## 2019-07-11 LAB — GLUCOSE, CAPILLARY
Glucose-Capillary: 104 mg/dL — ABNORMAL HIGH (ref 70–99)
Glucose-Capillary: 141 mg/dL — ABNORMAL HIGH (ref 70–99)
Glucose-Capillary: 80 mg/dL (ref 70–99)
Glucose-Capillary: 111 mg/dL — ABNORMAL HIGH (ref 70–99)
Glucose-Capillary: 126 mg/dL — ABNORMAL HIGH (ref 70–99)

## 2019-07-11 MED ORDER — LORAZEPAM 2 MG/ML IJ SOLN: 1.0000 mg | INTRAMUSCULAR | Status: DC | PRN

## 2019-07-11 MED ORDER — HALOPERIDOL LACTATE 5 MG/ML IJ SOLN
5.0000 mg | Freq: Four times a day (QID) | INTRAMUSCULAR | Status: DC | PRN
  Administered 2019-07-15: 5 mg via INTRAVENOUS
  Administered 2019-07-16 (×2): 10 mg via INTRAVENOUS
  Administered 2019-07-16: 5 mg via INTRAVENOUS
  Administered 2019-07-17: 10 mg via INTRAVENOUS
  Administered 2019-07-17: 5 mg via INTRAVENOUS
  Administered 2019-07-18 – 2019-07-19 (×2): 10 mg via INTRAVENOUS
  Filled 2019-07-11 (×2): qty 1
  Filled 2019-07-11 (×3): qty 2
  Filled 2019-07-11: qty 1
  Filled 2019-07-11 (×2): qty 2

## 2019-07-11 MED ORDER — OLANZAPINE 5 MG PO TABS
2.5000 mg | ORAL_TABLET | Freq: Two times a day (BID) | ORAL | Status: DC
  Administered 2019-07-11 – 2019-07-13 (×4): 2.5 mg
  Filled 2019-07-11 (×4): qty 1

## 2019-07-11 MED ORDER — CLONAZEPAM 0.25 MG PO TBDP
1.0000 mg | ORAL_TABLET | Freq: Two times a day (BID) | ORAL | Status: DC
  Administered 2019-07-11 – 2019-07-13 (×4): 1 mg
  Filled 2019-07-11: qty 4
  Filled 2019-07-11 (×2): qty 2
  Filled 2019-07-11: qty 8

## 2019-07-11 MED ORDER — IOHEXOL 300 MG/ML  SOLN
50.0000 mL | Freq: Once | INTRAMUSCULAR | Status: AC | PRN
  Administered 2019-07-11: 20 mL

## 2019-07-11 MED ORDER — LIDOCAINE VISCOUS HCL 2 % MT SOLN
OROMUCOSAL | Status: AC
  Filled 2019-07-11: qty 15

## 2019-07-11 MED ORDER — HALOPERIDOL LACTATE 5 MG/ML IJ SOLN
10.0000 mg | Freq: Once | INTRAMUSCULAR | Status: AC
  Administered 2019-07-11: 10 mg via INTRAMUSCULAR
  Filled 2019-07-11: qty 2

## 2019-07-11 NOTE — Consult Note (Addendum)
  Writer attempted to assist patient however patient is off the floor to interventional radiology to replace G-tube.  Per chart review patient apparently pulled his G-tube out last night, and became agitated and physically aggressive towards staff.  Patient remains on trauma service at this time who has increased his Klonopin, and added as needed Ativan and Haldol as needed for aggression and agitation.  Patient remains on sertraline 50 mg 1 tablet p.o. daily, quetiapine 25 mg p.o. twice daily, duloxetine 61 tablet daily, Klonopin 1 mg p.o. twice daily.    Patient currently on 5 psychotropic medications for depression, hallucinations, behavioral disturbances, and anxiety.  Patient will benefit from consolidate and some of these medications as he is already on multiple other medications to control hypertension, and blood sugars.  To reduce polypharmacy will start Zyprexa 2.5 p.o. twice daily, and discontinue Seroquel 25 and Ativan.  Patient has scheduled Klonopin 1 mg p.o. twice daily, and as needed order for Versed 2 mg every 8 hours for agitation. patient with history of traumatic brain injury will benefit from mood stabilizer to help reduce agitation and aggression associated with TBI.  Will attempt to reconsult with patient tomorrow once he returns back from from IR.  Ethics consult has also been placed however they have not been to see the patient.

## 2019-07-11 NOTE — Progress Notes (Signed)
Patient finally resting much better now after 10 mg Haldol IM. PEG tube reinsertion needs to be addressed with some type of scheduled behavioral med.
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
## 2019-07-11 NOTE — Procedures (Signed)
Interventional Radiology Procedure:   Indications: Displaced gastrostomy tube  Procedure: Gastrostomy tube replacement  Findings: New 20 Fr balloon retention tube in stomach  Complications: None     EBL: less than 10 ml  Plan: Gastrostomy is ready to use.   Vola Beneke R. Anselm Pancoast, MD  Pager: [phone]

## 2019-07-11 NOTE — Progress Notes (Signed)
Patient pulled dressing off to PEG site with clotted blood forming at site. Patient began swinging at RN and NT while Mittens being applied to hands. Patient deescalated with assistance of ICU RN Megan with new dressing applied to site with Xeroform and gauze dressing.
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
## 2019-07-11 NOTE — Progress Notes (Signed)
Patient has pulled out PEG tube after tube feedings stopped as order previously. 4 x 4 gauze applied and  Dr Gershon Crane texted by message.

## 2019-07-11 NOTE — Progress Notes (Signed)
Text message to Dr Gershon Crane about ?placement of PEG tube. Orders received.

## 2019-07-11 NOTE — Progress Notes (Signed)
Text message sent to Dr that patient has pulled out midline with Mitts on and screaming. Return call with order.

## 2019-07-12 LAB — GLUCOSE, CAPILLARY
Glucose-Capillary: 106 mg/dL — ABNORMAL HIGH (ref 70–99)
Glucose-Capillary: 111 mg/dL — ABNORMAL HIGH (ref 70–99)
Glucose-Capillary: 115 mg/dL — ABNORMAL HIGH (ref 70–99)
Glucose-Capillary: 118 mg/dL — ABNORMAL HIGH (ref 70–99)
Glucose-Capillary: 126 mg/dL — ABNORMAL HIGH (ref 70–99)
Glucose-Capillary: 130 mg/dL — ABNORMAL HIGH (ref 70–99)
Glucose-Capillary: 137 mg/dL — ABNORMAL HIGH (ref 70–99)

## 2019-07-12 NOTE — Consult Note (Addendum)
Psychiatry consult completed by nurse practitioner. Patient alert and oriented, participates in assessment. Patient currently denies suicidal and homicidal ideations. Regarding auditory visual hallucinations patient states "I see animals that are not there but only when I take my Robaxin, other than that no." Patient future oriented and engaged in treatment plan.  Patient states "I would like to go to a 24-hour care in [place] when I leave here and the sooner the better." Patient continues to await ethics consult. Psychiatry will continue to follow patient every 3-5 days while in rehabilitation.  Patient discussed with Dr. Dwyane Dee.

## 2019-07-12 NOTE — TOC Progression Note (Signed)
Transition of Care [facility]) - Progression Note    Patient Details  Name: Chase Caldwell MRN: [id number] Date of Birth: 16-Aug-1980  [facility] CM/SW Contact  Oren Section Cleta Alberts, RN Phone Number: 07/12/2019, 3:46 PM  Clinical Narrative:  Pt awaits bed at [place] notes regarding tube feedings and last MD progress note to [facility], per request.  Will follow.      Expected Discharge Plan: [facility] Barriers to Discharge: Psych Bed not available  Expected Discharge Plan and Services Expected Discharge Plan: [facility]   Discharge Planning Services: CM Consult   Living arrangements for the past 2 months: Single Family Home                                       Social Determinants of Health (SDOH) Interventions    Readmission Risk Interventions No flowsheet data found.  Reinaldo Raddle, RN, BSN  Trauma/Neuro ICU Case Manager [phone]
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
## 2019-07-13 DIAGNOSIS — F3131 Bipolar disorder, current episode depressed, mild: Secondary | ICD-10-CM

## 2019-07-13 LAB — GLUCOSE, CAPILLARY
Glucose-Capillary: 95 mg/dL (ref 70–99)
Glucose-Capillary: 96 mg/dL (ref 70–99)
Glucose-Capillary: 120 mg/dL — ABNORMAL HIGH (ref 70–99)
Glucose-Capillary: 118 mg/dL — ABNORMAL HIGH (ref 70–99)
Glucose-Capillary: 116 mg/dL — ABNORMAL HIGH (ref 70–99)

## 2019-07-13 MED ORDER — LORAZEPAM 1 MG PO TABS
1.0000 mg | ORAL_TABLET | Freq: Two times a day (BID) | ORAL | Status: DC
  Administered 2019-07-13 – 2019-07-19 (×13): 1 mg
  Filled 2019-07-13: qty 2
  Filled 2019-07-13: qty 1
  Filled 2019-07-13: qty 2
  Filled 2019-07-13: qty 1
  Filled 2019-07-13 (×9): qty 2

## 2019-07-13 MED ORDER — LORAZEPAM 1 MG PO TABS: 1.0000 mg | ORAL_TABLET | Freq: Two times a day (BID) | ORAL | Status: DC

## 2019-07-13 MED ORDER — ACETAMINOPHEN 160 MG/5ML PO SOLN
650.0000 mg | Freq: Four times a day (QID) | ORAL | Status: DC
  Administered 2019-07-13 – 2019-07-19 (×24): 650 mg
  Filled 2019-07-13 (×24): qty 20.3

## 2019-07-13 MED ORDER — LORAZEPAM 1 MG PO TABS
1.0000 mg | ORAL_TABLET | Freq: Two times a day (BID) | ORAL | Status: DC | PRN
  Administered 2019-07-16 – 2019-07-18 (×3): 1 mg
  Filled 2019-07-13 (×3): qty 2

## 2019-07-13 MED ORDER — LORAZEPAM 1 MG PO TABS: 1.0000 mg | ORAL_TABLET | Freq: Two times a day (BID) | ORAL | Status: DC | PRN

## 2019-07-13 MED ORDER — QUETIAPINE FUMARATE 50 MG PO TABS: 100.0000 mg | ORAL_TABLET | Freq: Every day | ORAL | Status: DC

## 2019-07-13 MED ORDER — QUETIAPINE FUMARATE 50 MG PO TABS
100.0000 mg | ORAL_TABLET | Freq: Every day | ORAL | Status: DC
  Administered 2019-07-13 – 2019-07-18 (×6): 100 mg
  Filled 2019-07-13 (×6): qty 2
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
## 2019-07-14 LAB — GLUCOSE, CAPILLARY
Glucose-Capillary: 109 mg/dL — ABNORMAL HIGH (ref 70–99)
Glucose-Capillary: 113 mg/dL — ABNORMAL HIGH (ref 70–99)
Glucose-Capillary: 115 mg/dL — ABNORMAL HIGH (ref 70–99)
Glucose-Capillary: 89 mg/dL (ref 70–99)
Glucose-Capillary: 98 mg/dL (ref 70–99)

## 2019-07-14 NOTE — TOC Progression Note (Signed)
Transition of Care [facility]) - Progression Note    Patient Details  Name: Chase Caldwell MRN: [id number] Date of Birth: 08-07-1980  Transition of Care [facility]) CM/SW [place], [place] Phone Number: [phone] 07/14/2019, 11:26 AM  Clinical Narrative:     Patient disposition plan appears to be inpatient rehab again therefore they would need to reassess patient. It does not appear that inpatient psy placement is  needed at this time.  TOC team will continue to monitor for discharge planning needs.  [facility] Barriers to Discharge: Psych Bed not available  Expected Discharge Plan and Services Expected Discharge Plan: [facility]   Discharge Planning Services: CM Consult   Living arrangements for the past 2 months: Single Family Home                                       Social Determinants of Health (SDOH) Interventions    Readmission Risk Interventions No flowsheet data found.
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
## 2019-07-15 LAB — BASIC METABOLIC PANEL
Anion gap: 11 (ref 5–15)
BUN: 15 mg/dL (ref 6–20)
CO2: 25 mmol/L (ref 22–32)
Calcium: 9.1 mg/dL (ref 8.9–10.3)
Chloride: 101 mmol/L (ref 98–111)
Creatinine, Ser: 0.71 mg/dL (ref 0.61–1.24)
GFR calc Af Amer: >60 mL/min (ref >60)
GFR calc non Af Amer: >60 mL/min (ref >60)
Glucose, Bld: 105 mg/dL — ABNORMAL HIGH (ref 70–99)
Potassium: 4.3 mmol/L (ref 3.5–5.1)
Sodium: 137 mmol/L (ref 135–145)

## 2019-07-15 LAB — GLUCOSE, CAPILLARY
Glucose-Capillary: 100 mg/dL — ABNORMAL HIGH (ref 70–99)
Glucose-Capillary: 101 mg/dL — ABNORMAL HIGH (ref 70–99)
Glucose-Capillary: 106 mg/dL — ABNORMAL HIGH (ref 70–99)
Glucose-Capillary: 111 mg/dL — ABNORMAL HIGH (ref 70–99)
Glucose-Capillary: 120 mg/dL — ABNORMAL HIGH (ref 70–99)
Glucose-Capillary: 131 mg/dL — ABNORMAL HIGH (ref 70–99)
Glucose-Capillary: 137 mg/dL — ABNORMAL HIGH (ref 70–99)

## 2019-07-15 LAB — MAGNESIUM: Magnesium: 2.1 mg/dL (ref 1.7–2.4)

## 2019-07-15 LAB — PHOSPHORUS: Phosphorus: 4.8 mg/dL — ABNORMAL HIGH (ref 2.5–4.6)

## 2019-07-15 NOTE — Progress Notes (Signed)
Inpatient Rehab Admissions Coordinator:   Following for my colleague, Danne Baxter, for CIR.  We have no beds available for this patient today.  Pamala Hurry will return tomorrow.    Shann Medal, PT, DPT Admissions Coordinator [phone] 07/15/19  9:49 AM
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
## 2019-07-15 NOTE — Progress Notes (Signed)
Please encourage pt to take deep breaths when visibly distressed. I have encouraged him to take 6 deep breaths in 1 min or use the 4-5-6 technique. His body and twitches are less frequent and encourages pt to self-soothe.
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
## 2019-07-16 LAB — GLUCOSE, CAPILLARY
Glucose-Capillary: 108 mg/dL — ABNORMAL HIGH (ref 70–99)
Glucose-Capillary: 108 mg/dL — ABNORMAL HIGH (ref 70–99)
Glucose-Capillary: 112 mg/dL — ABNORMAL HIGH (ref 70–99)
Glucose-Capillary: 117 mg/dL — ABNORMAL HIGH (ref 70–99)
Glucose-Capillary: 120 mg/dL — ABNORMAL HIGH (ref 70–99)
Glucose-Capillary: 99 mg/dL (ref 70–99)

## 2019-07-16 MED ORDER — ENSURE ENLIVE PO LIQD
237.0000 mL | Freq: Three times a day (TID) | ORAL | Status: DC
  Administered 2019-07-16 – 2019-07-19 (×11): 237 mL via ORAL

## 2019-07-16 MED ORDER — OSMOLITE 1.5 CAL PO LIQD
840.0000 mL | ORAL | Status: DC
  Administered 2019-07-16 – 2019-07-19 (×3): 840 mL
  Filled 2019-07-16 (×6): qty 948

## 2019-07-16 MED ORDER — ADULT MULTIVITAMIN W/MINERALS CH
1.0000 | ORAL_TABLET | Freq: Every day | ORAL | Status: DC
  Administered 2019-07-17 – 2019-07-19 (×3): 1 via ORAL
  Filled 2019-07-16 (×3): qty 1

## 2019-07-16 MED ORDER — FREE WATER
100.0000 mL | Freq: Four times a day (QID) | Status: DC
  Administered 2019-07-16 – 2019-07-19 (×13): 100 mL
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
## 2019-07-16 NOTE — Progress Notes (Signed)
Called to room by safety sitter to inform this writer that patient nose was bleeding from taking deep breathes and blowing nose. Upon assessment blood protruded on sheets and scattered on patient. Patient had blood clot formed from nose bleed. Bleeding subsided after patients face was washed and cleaned. Patient bathed and full linen on bed changed. Will continue to monitor.
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
## 2019-07-16 NOTE — Progress Notes (Signed)
PT Cancellation Note  Patient Details Name: Sevyn Paredez MRN: [id number] DOB: 12-Oct-1980   Cancelled Treatment:    Reason Eval/Treat Not Completed: Fatigue/lethargy limiting ability to participate. Pt just received medicine, hard asleep. Will check back in afternoon.   Leighton Roach, PT  Acute Rehab Services  Pager [phone] Office [place] 07/16/2019, 11:12 AM
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
## 2019-07-17 LAB — GLUCOSE, CAPILLARY
Glucose-Capillary: 100 mg/dL — ABNORMAL HIGH (ref 70–99)
Glucose-Capillary: 103 mg/dL — ABNORMAL HIGH (ref 70–99)
Glucose-Capillary: 105 mg/dL — ABNORMAL HIGH (ref 70–99)
Glucose-Capillary: 106 mg/dL — ABNORMAL HIGH (ref 70–99)
Glucose-Capillary: 107 mg/dL — ABNORMAL HIGH (ref 70–99)
Glucose-Capillary: 114 mg/dL — ABNORMAL HIGH (ref 70–99)
Glucose-Capillary: 127 mg/dL — ABNORMAL HIGH (ref 70–99)
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
## 2019-07-17 NOTE — Progress Notes (Signed)
Psychotic most of the night. Restless with increased work of breathing despite oxygen sats of 100% on room air. Works himself a sweat from purposeless movements,no fever. Episodes of screaming.

## 2019-07-17 NOTE — Progress Notes (Signed)
Pt restless most of the night despite being medicated with prn versed and haldol. Grinding teeth and flaring limbs purposelessly. Robaxin does not seem to be having an effect. Some slight response to seroquel noted but only briefly.

## 2019-07-17 NOTE — Consult Note (Signed)
Dr. Hampton Abbot has facilitated treatment plan to include patient cleared by psychiatry to follow-up with rehabilitation needs. Patient, and patient's wife, Otila Kluver, discussed desire to have patient follow-up with established outpatient psychiatry once physical rehabilitation complete. [name] wife, Caley Volkert, completed "no harm contract" on patient's behalf. Will add contact to patient's electronic medical record.
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
## 2019-07-18 LAB — GLUCOSE, CAPILLARY
Glucose-Capillary: 84 mg/dL (ref 70–99)
Glucose-Capillary: 100 mg/dL — ABNORMAL HIGH (ref 70–99)
Glucose-Capillary: 103 mg/dL — ABNORMAL HIGH (ref 70–99)
Glucose-Capillary: 123 mg/dL — ABNORMAL HIGH (ref 70–99)
Glucose-Capillary: 97 mg/dL (ref 70–99)
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
## 2019-07-18 NOTE — Progress Notes (Signed)
Place the profo boots patient requested for them to be removed. Arthor Captain LPN

## 2019-07-18 NOTE — Progress Notes (Signed)
Calorie Count Note  48 hour calorie count ordered.  Diet: dysphagia 1 with thin liquids Supplements: MVI with minerals daily; Ensure Enlive po TID, each supplement provides 350 kcal and 20 grams of protein; Magic cup TID with meals, each supplement provides 290 kcal and 9 grams of protein  Reviewed meal completion data over the past 4 days. Intake has improved, however, pt only consuming about 53% of estimated calorie needs over the past 4 days on average. While intake is improving, would like to see pt consistently meet >75% of estimated needs orally prior to discontinuing TF.   7/4 Breakfast:802 kcals, 26 grams protein Lunch:619 kcals, 32 grams protein  Total intake: 1421kcal (59% of minimum estimated needs) 58 gramsprotein (46% of minimum estimated needs)  7/5 Breakfast:0% documented Lunch:914 kcals, 47 grams protein  Total intake: 914kcal (38% of minimum estimated needs) 47 gramsprotein (38% of minimum estimated needs)  7/6 Breakfast: 0% Lunch: 0% Dinner: no documenteation Supplements: 3 Ensure Enlive supplements  Total intake: 1050 kcal (44% of minimum estimated needs)  60 grams protein (48% of minimum estimated needs)   7/7 Breakfast: 145 kcals, 5 grams protein Lunch: 359 kcals, 3 grams protein Dinner: 183 kcals, 7 grams protein Supplements: 3 Ensure Enlive supplements (1050 kcals and 60 grams protein)  Total intake: 1737 kcal (72% of minimum estimated needs)  75 grams protein (60% of minimum estimated needs)  Average Total intake (over the 96 hours): 1280 kcal (53% of minimum estimated needs)  60 grams protein (48% of minimum estimated needs)  Nutrition Dx: Increased nutrient needsrelated to (trauma)as evidenced by estimated needs; ongoing  Goal: Patient will meet greater than or equal to 90% of their needs: progressing   Intervention:   -D/c calorie count -Continue MVI with minerals daily -ContinueEnsure Enlive poTID, each  supplement provides 350 kcal and 20 grams of protein -Continue Magic cup TID with meals, each supplement provides 290 kcal and 9 grams of protein -Continue nocturnal TF:   Osmolite 1.5@ 19ml/hr via PEG over 12 hour period (2000-0800)  100 ml free water flush 4 times daily  Tube feeding regimen provides1260kcal (53% of needs),53grams of protein, and684ml of H2O. Total free water: 1040 ml  Loistine Chance, RD, LDN, [place] Registered Dietitian II Certified Diabetes Care and Education Specialist Please refer to [facility] for RD and/or RD on-call/weekend/after hours pager

## 2019-07-18 NOTE — Progress Notes (Signed)
Inpatient Rehabilitation Admissions Coordinator  I contacted Dr. Bobbye Morton and obtained her clearance for Discharge to CIR. I met with patient at bedside and spoke with his wife by phone. They both are in agreement to CIR admit. I am hopeful for bed available on Friday. I have notified Acute and TOC team of plans. Please let me know if you have any questions.  Danne Baxter, RN, MSN Rehab Admissions Coordinator [phone] 07/18/2019 10:44 AM

## 2019-07-18 NOTE — Plan of Care (Signed)

## 2019-07-18 NOTE — Plan of Care (Signed)
  Problem: Clinical Measurements: Goal: Cardiovascular complication will be avoided Outcome: Progressing   Problem: Activity: Goal: Risk for activity intolerance will decrease Outcome: Progressing   Problem: Nutrition: Goal: Adequate nutrition will be maintained Outcome: Progressing   
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
## 2019-07-19 ENCOUNTER — Inpatient Hospital Stay (HOSPITAL_COMMUNITY)
Admission: RE | Admit: 2019-07-19 | Discharge: 2019-08-17 | DRG: 949 | Disposition: A | Discharge status: Home Health | Payer: Medicaid Other | Source: Intra-hospital | Attending: Physical Medicine & Rehabilitation | Admitting: Physical Medicine & Rehabilitation

## 2019-07-19 ENCOUNTER — Encounter (HOSPITAL_COMMUNITY): Payer: Self-pay | Admitting: Physical Medicine & Rehabilitation

## 2019-07-19 ENCOUNTER — Other Ambulatory Visit: Payer: Self-pay

## 2019-07-19 ENCOUNTER — Encounter: Payer: Self-pay | Admitting: Emergency Medicine

## 2019-07-19 DIAGNOSIS — R000 Tachycardia, unspecified: Secondary | ICD-10-CM | POA: Diagnosis present

## 2019-07-19 DIAGNOSIS — M4802 Spinal stenosis, cervical region: Secondary | ICD-10-CM | POA: Diagnosis present

## 2019-07-19 DIAGNOSIS — R7303 Prediabetes: Secondary | ICD-10-CM

## 2019-07-19 DIAGNOSIS — S0183XA Puncture wound without foreign body of other part of head, initial encounter: Secondary | ICD-10-CM

## 2019-07-19 DIAGNOSIS — D62 Acute posthemorrhagic anemia: Secondary | ICD-10-CM | POA: Diagnosis present

## 2019-07-19 DIAGNOSIS — F319 Bipolar disorder, unspecified: Secondary | ICD-10-CM | POA: Diagnosis present

## 2019-07-19 DIAGNOSIS — S069X3S Unspecified intracranial injury with loss of consciousness of 1 hour to 5 hours 59 minutes, sequela: Secondary | ICD-10-CM | POA: Diagnosis not present

## 2019-07-19 DIAGNOSIS — G249 Dystonia, unspecified: Secondary | ICD-10-CM | POA: Diagnosis not present

## 2019-07-19 DIAGNOSIS — T07XXXA Unspecified multiple injuries, initial encounter: Secondary | ICD-10-CM

## 2019-07-19 DIAGNOSIS — F4310 Post-traumatic stress disorder, unspecified: Secondary | ICD-10-CM | POA: Diagnosis present

## 2019-07-19 DIAGNOSIS — R7401 Elevation of levels of liver transaminase levels: Secondary | ICD-10-CM | POA: Diagnosis present

## 2019-07-19 DIAGNOSIS — G931 Anoxic brain damage, not elsewhere classified: Secondary | ICD-10-CM | POA: Diagnosis present

## 2019-07-19 DIAGNOSIS — Z931 Gastrostomy status: Secondary | ICD-10-CM

## 2019-07-19 DIAGNOSIS — M6289 Other specified disorders of muscle: Secondary | ICD-10-CM

## 2019-07-19 DIAGNOSIS — K9423 Gastrostomy malfunction: Secondary | ICD-10-CM | POA: Diagnosis present

## 2019-07-19 DIAGNOSIS — F17210 Nicotine dependence, cigarettes, uncomplicated: Secondary | ICD-10-CM | POA: Diagnosis present

## 2019-07-19 DIAGNOSIS — Z8674 Personal history of sudden cardiac arrest: Secondary | ICD-10-CM | POA: Diagnosis not present

## 2019-07-19 DIAGNOSIS — S0219XD Other fracture of base of skull, subsequent encounter for fracture with routine healing: Secondary | ICD-10-CM

## 2019-07-19 DIAGNOSIS — Y249XXA Unspecified firearm discharge, undetermined intent, initial encounter: Secondary | ICD-10-CM

## 2019-07-19 DIAGNOSIS — Z6835 Body mass index (BMI) 35.0-35.9, adult: Secondary | ICD-10-CM

## 2019-07-19 DIAGNOSIS — I10 Essential (primary) hypertension: Secondary | ICD-10-CM | POA: Diagnosis present

## 2019-07-19 DIAGNOSIS — S0184XD Puncture wound with foreign body of other part of head, subsequent encounter: Secondary | ICD-10-CM

## 2019-07-19 DIAGNOSIS — R739 Hyperglycemia, unspecified: Secondary | ICD-10-CM | POA: Diagnosis present

## 2019-07-19 DIAGNOSIS — G8250 Quadriplegia, unspecified: Secondary | ICD-10-CM | POA: Diagnosis present

## 2019-07-19 DIAGNOSIS — S022XXD Fracture of nasal bones, subsequent encounter for fracture with routine healing: Secondary | ICD-10-CM

## 2019-07-19 DIAGNOSIS — X749XXD Intentional self-harm by unspecified firearm discharge, subsequent encounter: Secondary | ICD-10-CM | POA: Diagnosis not present

## 2019-07-19 DIAGNOSIS — B965 Pseudomonas (aeruginosa) (mallei) (pseudomallei) as the cause of diseases classified elsewhere: Secondary | ICD-10-CM | POA: Diagnosis present

## 2019-07-19 DIAGNOSIS — M545 Low back pain: Secondary | ICD-10-CM | POA: Diagnosis present

## 2019-07-19 DIAGNOSIS — S069XAA Unspecified intracranial injury with loss of consciousness status unknown, initial encounter: Secondary | ICD-10-CM | POA: Diagnosis present

## 2019-07-19 DIAGNOSIS — N390 Urinary tract infection, site not specified: Secondary | ICD-10-CM | POA: Diagnosis not present

## 2019-07-19 DIAGNOSIS — F909 Attention-deficit hyperactivity disorder, unspecified type: Secondary | ICD-10-CM | POA: Diagnosis present

## 2019-07-19 DIAGNOSIS — R1310 Dysphagia, unspecified: Secondary | ICD-10-CM

## 2019-07-19 DIAGNOSIS — S069X9S Unspecified intracranial injury with loss of consciousness of unspecified duration, sequela: Secondary | ICD-10-CM | POA: Diagnosis not present

## 2019-07-19 DIAGNOSIS — E876 Hypokalemia: Secondary | ICD-10-CM | POA: Diagnosis present

## 2019-07-19 DIAGNOSIS — R0602 Shortness of breath: Secondary | ICD-10-CM | POA: Diagnosis present

## 2019-07-19 DIAGNOSIS — S069X3D Unspecified intracranial injury with loss of consciousness of 1 hour to 5 hours 59 minutes, subsequent encounter: Secondary | ICD-10-CM | POA: Diagnosis not present

## 2019-07-19 DIAGNOSIS — R030 Elevated blood-pressure reading, without diagnosis of hypertension: Secondary | ICD-10-CM | POA: Diagnosis not present

## 2019-07-19 DIAGNOSIS — S0242XD Fracture of alveolus of maxilla, subsequent encounter for fracture with routine healing: Secondary | ICD-10-CM

## 2019-07-19 DIAGNOSIS — R252 Cramp and spasm: Secondary | ICD-10-CM | POA: Diagnosis not present

## 2019-07-19 DIAGNOSIS — E669 Obesity, unspecified: Secondary | ICD-10-CM | POA: Diagnosis present

## 2019-07-19 DIAGNOSIS — R945 Abnormal results of liver function studies: Secondary | ICD-10-CM | POA: Diagnosis present

## 2019-07-19 DIAGNOSIS — I639 Cerebral infarction, unspecified: Secondary | ICD-10-CM | POA: Diagnosis not present

## 2019-07-19 DIAGNOSIS — G894 Chronic pain syndrome: Secondary | ICD-10-CM

## 2019-07-19 DIAGNOSIS — J939 Pneumothorax, unspecified: Secondary | ICD-10-CM

## 2019-07-19 DIAGNOSIS — S069X2S Unspecified intracranial injury with loss of consciousness of 31 minutes to 59 minutes, sequela: Secondary | ICD-10-CM | POA: Diagnosis not present

## 2019-07-19 DIAGNOSIS — R748 Abnormal levels of other serum enzymes: Secondary | ICD-10-CM | POA: Diagnosis present

## 2019-07-19 DIAGNOSIS — S062X9D Diffuse traumatic brain injury with loss of consciousness of unspecified duration, subsequent encounter: Principal | ICD-10-CM

## 2019-07-19 DIAGNOSIS — B961 Klebsiella pneumoniae [K. pneumoniae] as the cause of diseases classified elsewhere: Secondary | ICD-10-CM | POA: Diagnosis present

## 2019-07-19 DIAGNOSIS — S069X9A Unspecified intracranial injury with loss of consciousness of unspecified duration, initial encounter: Secondary | ICD-10-CM | POA: Diagnosis present

## 2019-07-19 DIAGNOSIS — M62838 Other muscle spasm: Secondary | ICD-10-CM | POA: Diagnosis present

## 2019-07-19 HISTORY — DX: Urinary tract infection, site not specified: N39.0

## 2019-07-19 LAB — GLUCOSE, CAPILLARY
Glucose-Capillary: 103 mg/dL — ABNORMAL HIGH (ref 70–99)
Glucose-Capillary: 103 mg/dL — ABNORMAL HIGH (ref 70–99)
Glucose-Capillary: 107 mg/dL — ABNORMAL HIGH (ref 70–99)
Glucose-Capillary: 111 mg/dL — ABNORMAL HIGH (ref 70–99)
Glucose-Capillary: 111 mg/dL — ABNORMAL HIGH (ref 70–99)
Glucose-Capillary: 116 mg/dL — ABNORMAL HIGH (ref 70–99)
Glucose-Capillary: 97 mg/dL (ref 70–99)

## 2019-07-19 MED ORDER — ENOXAPARIN SODIUM 40 MG/0.4ML ~~LOC~~ SOLN: 40.0000 mg | SUBCUTANEOUS | Status: DC

## 2019-07-19 MED ORDER — QUETIAPINE FUMARATE 50 MG PO TABS
150.0000 mg | ORAL_TABLET | Freq: Every day | ORAL | Status: DC
  Administered 2019-07-19 – 2019-07-29 (×11): 150 mg
  Filled 2019-07-19 (×3): qty 3
  Filled 2019-07-19: qty 6
  Filled 2019-07-19 (×7): qty 3

## 2019-07-19 MED ORDER — RESOURCE THICKENUP CLEAR PO POWD: ORAL | Status: DC | PRN

## 2019-07-19 MED ORDER — ACETAMINOPHEN 160 MG/5ML PO SOLN
650.0000 mg | Freq: Four times a day (QID) | ORAL | Status: DC
  Administered 2019-07-19 – 2019-08-05 (×57): 650 mg
  Administered 2019-08-05: 325 mg
  Administered 2019-08-06 – 2019-08-17 (×38): 650 mg
  Filled 2019-07-19 (×104): qty 20.3

## 2019-07-19 MED ORDER — OSMOLITE 1.5 CAL PO LIQD
840.0000 mL | ORAL | Status: DC
  Filled 2019-07-19 (×3): qty 948

## 2019-07-19 MED ORDER — BISACODYL 10 MG RE SUPP: 10.0000 mg | Freq: Every day | RECTAL | Status: DC | PRN

## 2019-07-19 MED ORDER — DIPHENHYDRAMINE HCL 12.5 MG/5ML PO ELIX
12.5000 mg | ORAL_SOLUTION | Freq: Four times a day (QID) | ORAL | Status: DC | PRN
  Administered 2019-07-20 – 2019-07-28 (×4): 25 mg via ORAL
  Filled 2019-07-19 (×4): qty 10

## 2019-07-19 MED ORDER — TRAMADOL HCL 50 MG PO TABS
25.0000 mg | ORAL_TABLET | Freq: Four times a day (QID) | ORAL | Status: DC | PRN
  Administered 2019-07-19 – 2019-07-28 (×23): 50 mg
  Filled 2019-07-19 (×24): qty 1

## 2019-07-19 MED ORDER — ACETAMINOPHEN 325 MG PO TABS
325.0000 mg | ORAL_TABLET | ORAL | Status: DC | PRN
  Administered 2019-07-22 – 2019-07-27 (×6): 650 mg via ORAL
  Administered 2019-07-30: 325 mg via ORAL
  Administered 2019-08-02 – 2019-08-11 (×3): 650 mg via ORAL
  Filled 2019-07-19 (×13): qty 2

## 2019-07-19 MED ORDER — METHOCARBAMOL 500 MG PO TABS
1000.0000 mg | ORAL_TABLET | Freq: Three times a day (TID) | ORAL | Status: DC
  Administered 2019-07-19 – 2019-07-20 (×3): 1000 mg
  Filled 2019-07-19 (×3): qty 2

## 2019-07-19 MED ORDER — PANTOPRAZOLE SODIUM 40 MG PO PACK
40.0000 mg | PACK | Freq: Every day | ORAL | Status: DC
  Administered 2019-07-20 – 2019-08-17 (×29): 40 mg
  Filled 2019-07-19 (×27): qty 20

## 2019-07-19 MED ORDER — OSMOLITE 1.5 CAL PO LIQD: 1000.0000 mL | ORAL | Status: DC

## 2019-07-19 MED ORDER — LORAZEPAM 1 MG PO TABS
1.0000 mg | ORAL_TABLET | Freq: Two times a day (BID) | ORAL | Status: DC | PRN
  Administered 2019-07-20 – 2019-07-30 (×7): 1 mg
  Filled 2019-07-19 (×7): qty 1

## 2019-07-19 MED ORDER — CHLORHEXIDINE GLUCONATE 0.12 % MT SOLN
15.0000 mL | Freq: Two times a day (BID) | OROMUCOSAL | Status: DC
  Administered 2019-07-20 – 2019-08-16 (×52): 15 mL via OROMUCOSAL
  Filled 2019-07-19 (×53): qty 15

## 2019-07-19 MED ORDER — DULOXETINE HCL 60 MG PO CPEP
60.0000 mg | ORAL_CAPSULE | Freq: Every day | ORAL | Status: DC
  Administered 2019-07-20 – 2019-08-17 (×29): 60 mg via ORAL
  Filled 2019-07-19 (×29): qty 1

## 2019-07-19 MED ORDER — METOPROLOL TARTRATE 25 MG/10 ML ORAL SUSPENSION
25.0000 mg | Freq: Two times a day (BID) | ORAL | Status: DC
  Administered 2019-07-19 – 2019-07-20 (×2): 25 mg
  Filled 2019-07-19 (×3): qty 10

## 2019-07-19 MED ORDER — QUETIAPINE FUMARATE 50 MG PO TABS
50.0000 mg | ORAL_TABLET | Freq: Every day | ORAL | Status: DC
  Administered 2019-07-20 – 2019-08-17 (×29): 50 mg via ORAL
  Filled 2019-07-19 (×29): qty 1

## 2019-07-19 MED ORDER — OSMOLITE 1.5 CAL PO LIQD
840.0000 mL | ORAL | Status: DC
  Administered 2019-07-20 – 2019-07-25 (×6): 840 mL
  Filled 2019-07-19 (×9): qty 1000

## 2019-07-19 MED ORDER — INSULIN ASPART 100 UNIT/ML ~~LOC~~ SOLN
0.0000 [IU] | SUBCUTANEOUS | Status: DC
  Administered 2019-07-21 – 2019-07-29 (×4): 2 [IU] via SUBCUTANEOUS

## 2019-07-19 MED ORDER — HALOPERIDOL LACTATE 5 MG/ML IJ SOLN
5.0000 mg | Freq: Four times a day (QID) | INTRAMUSCULAR | Status: DC | PRN
  Filled 2019-07-19: qty 1

## 2019-07-19 MED ORDER — PROCHLORPERAZINE 25 MG RE SUPP: 12.5000 mg | Freq: Four times a day (QID) | RECTAL | Status: DC | PRN

## 2019-07-19 MED ORDER — SERTRALINE HCL 50 MG PO TABS
50.0000 mg | ORAL_TABLET | Freq: Every day | ORAL | Status: DC
  Administered 2019-07-20 – 2019-08-17 (×29): 50 mg
  Filled 2019-07-19 (×29): qty 1

## 2019-07-19 MED ORDER — ONDANSETRON HCL 4 MG/2ML IJ SOLN: 4.0000 mg | Freq: Four times a day (QID) | INTRAMUSCULAR | Status: DC | PRN

## 2019-07-19 MED ORDER — ORAL CARE MOUTH RINSE
15.0000 mL | Freq: Two times a day (BID) | OROMUCOSAL | Status: DC
  Administered 2019-07-19 – 2019-08-16 (×44): 15 mL via OROMUCOSAL

## 2019-07-19 MED ORDER — ENOXAPARIN SODIUM 40 MG/0.4ML ~~LOC~~ SOLN
40.0000 mg | Freq: Two times a day (BID) | SUBCUTANEOUS | Status: DC
  Administered 2019-07-19 – 2019-08-17 (×58): 40 mg via SUBCUTANEOUS
  Filled 2019-07-19 (×58): qty 0.4

## 2019-07-19 MED ORDER — OSMOLITE 1.5 CAL PO LIQD: 70.0000 mL/h | ORAL | Status: DC

## 2019-07-19 MED ORDER — PROCHLORPERAZINE EDISYLATE 10 MG/2ML IJ SOLN: 5.0000 mg | Freq: Four times a day (QID) | INTRAMUSCULAR | Status: DC | PRN

## 2019-07-19 MED ORDER — PROCHLORPERAZINE MALEATE 5 MG PO TABS
5.0000 mg | ORAL_TABLET | Freq: Four times a day (QID) | ORAL | Status: DC | PRN
  Administered 2019-07-28 – 2019-08-04 (×3): 10 mg via ORAL
  Filled 2019-07-19 (×3): qty 2

## 2019-07-19 MED ORDER — ONDANSETRON 4 MG PO TBDP
4.0000 mg | ORAL_TABLET | Freq: Four times a day (QID) | ORAL | Status: DC | PRN
  Administered 2019-08-05: 4 mg via ORAL
  Filled 2019-07-19: qty 1

## 2019-07-19 MED ORDER — ENSURE ENLIVE PO LIQD
237.0000 mL | Freq: Three times a day (TID) | ORAL | Status: DC
  Administered 2019-07-20 – 2019-08-12 (×44): 237 mL via ORAL

## 2019-07-19 MED ORDER — ALUM & MAG HYDROXIDE-SIMETH 200-200-20 MG/5ML PO SUSP: 30.0000 mL | ORAL | Status: DC | PRN

## 2019-07-19 MED ORDER — LORAZEPAM 1 MG PO TABS
1.0000 mg | ORAL_TABLET | Freq: Two times a day (BID) | ORAL | Status: DC
  Administered 2019-07-19 – 2019-07-31 (×24): 1 mg
  Filled 2019-07-19 (×16): qty 1
  Filled 2019-07-19: qty 2
  Filled 2019-07-19 (×7): qty 1

## 2019-07-19 MED ORDER — POLYETHYLENE GLYCOL 3350 17 G PO PACK
17.0000 g | PACK | Freq: Every day | ORAL | Status: DC | PRN
  Administered 2019-08-06: 17 g via ORAL

## 2019-07-19 MED ORDER — IPRATROPIUM-ALBUTEROL 0.5-2.5 (3) MG/3ML IN SOLN
3.0000 mL | Freq: Four times a day (QID) | RESPIRATORY_TRACT | Status: DC | PRN
  Administered 2019-07-26: 3 mL via RESPIRATORY_TRACT
  Filled 2019-07-19: qty 3

## 2019-07-19 MED ORDER — FREE WATER
100.0000 mL | Freq: Four times a day (QID) | Status: DC
  Administered 2019-07-19 – 2019-07-28 (×32): 100 mL

## 2019-07-19 MED ORDER — ADULT MULTIVITAMIN W/MINERALS CH
1.0000 | ORAL_TABLET | Freq: Every day | ORAL | Status: DC
  Administered 2019-07-20 – 2019-08-17 (×29): 1 via ORAL
  Filled 2019-07-19 (×29): qty 1

## 2019-07-19 MED ORDER — FLEET ENEMA 7-19 GM/118ML RE ENEM: 1.0000 | ENEMA | Freq: Once | RECTAL | Status: DC | PRN

## 2019-07-19 MED ORDER — GUAIFENESIN-DM 100-10 MG/5ML PO SYRP: 5.0000 mL | ORAL_SOLUTION | Freq: Four times a day (QID) | ORAL | Status: DC | PRN

## 2019-07-19 MED ORDER — OSMOLITE 1.5 CAL PO LIQD
840.0000 mL | Freq: Once | ORAL | Status: AC
  Administered 2019-07-19: 840 mL

## 2019-07-19 NOTE — Progress Notes (Signed)
Inpatient Rehabilitation Admissions Coordinator  CIR bed is available to admit patient today. I met with patient at bedside as well as contacted his wife by phone. They are in agreement to admit. I contacted [place], [place] as well as acute team and TOC . I will make the arrangements to admit today.  Danne Baxter, RN, MSN Rehab Admissions Coordinator [phone] 07/19/2019 10:08 AM
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
## 2019-07-19 NOTE — Progress Notes (Signed)
Pt belongings packed and sent to [facility] with Pt. Pt transported by [employer] nurses.

## 2019-07-19 NOTE — Progress Notes (Signed)
[facility] Progress Note  32 Days Post-Op  Subjective: CC-  Resting comfortably. Wife at bedside.  Objective: Vital signs in last 24 hours: Temp:  [97.5 F (36.4 C)-98.2 F (36.8 C)] 98.2 F (36.8 C) (07/09 0406) Pulse Rate:  [88-91] 88 (07/09 0406) Resp:  [18] 18 (07/09 0406) BP: (121-144)/(82-83) 121/83 (07/09 0406) SpO2:  [97 %] 97 % (07/09 0406) Weight:  [144 kg] 115 kg (07/09 0500) Last BM Date: 07/18/19  Intake/Output from previous day: 07/08 0701 - 07/09 0700 In: 1191.5 [P.O.:957; NG/GT:234.5] Out: -  Intake/Output this shift: No intake/output data recorded.  PE: Gen: sleeping, NAD Pulm: rate and effort normal Abd: Soft, NT/ND, G tube intact with TF running  Ext: no BUE/BLE edema Skin: warm and dry   Lab Results:  No results for input(s): WBC, HGB, HCT, PLT in the last 72 hours. BMET No results for input(s): NA, K, CL, CO2, GLUCOSE, BUN, CREATININE, CALCIUM in the last 72 hours. PT/INR No results for input(s): LABPROT, INR in the last 72 hours. CMP     Component Value Date/Time   NA 137 07/15/2019 1137   K 4.3 07/15/2019 1137   CL 101 07/15/2019 1137   CO2 25 07/15/2019 1137   GLUCOSE 105 (H) 07/15/2019 1137   BUN 15 07/15/2019 1137   CREATININE 0.71 07/15/2019 1137   CALCIUM 9.1 07/15/2019 1137   PROT 6.5 06/06/2019 1426   ALBUMIN 3.5 06/06/2019 1426   AST 37 06/06/2019 1426   ALT 45 (H) 06/06/2019 1426   ALKPHOS 80 06/06/2019 1426   BILITOT 1.1 06/06/2019 1426   GFRNONAA >60 07/15/2019 1137   GFRAA >60 07/15/2019 1137   Lipase  No results found for: LIPASE     Studies/Results: No results found.  Anti-infectives: Anti-infectives (From admission, onward)   Start     Dose/Rate Route Frequency Ordered Stop   06/19/19 0800  Ampicillin-Sulbactam (UNASYN) 3 g in sodium chloride 0.9 % 100 mL IVPB        3 g 200 mL/hr over 30 Minutes Intravenous Every 6 hours 06/19/19 0738 06/24/19 2054   06/16/19 1800  cefTRIAXone (ROCEPHIN)  2 g in sodium chloride 0.9 % 100 mL IVPB        2 g 200 mL/hr over 30 Minutes Intravenous Every 24 hours 06/16/19 1323 06/18/19 2359   06/14/19 1000  piperacillin-tazobactam (ZOSYN) IVPB 3.375 g        3.375 g 12.5 mL/hr over 240 Minutes Intravenous Every 8 hours 06/14/19 0849 06/16/19 1418   06/12/19 1500  Ampicillin-Sulbactam (UNASYN) 3 g in sodium chloride 0.9 % 100 mL IVPB  Status:  Discontinued        3 g 200 mL/hr over 30 Minutes Intravenous Every 6 hours 06/12/19 1446 06/14/19 0849   06/09/19 0843  polymyxin B 500,000 Units, bacitracin 50,000 Units in sodium chloride 0.9 % 500 mL irrigation  Status:  Discontinued          As needed 06/09/19 0843 06/09/19 1038   06/07/19 1030  ceFAZolin (ANCEF) IVPB 2g/100 mL premix  Status:  Discontinued        2 g 200 mL/hr over 30 Minutes Intravenous Every 8 hours 06/07/19 0955 06/11/19 0858   06/06/19 1530  ceFAZolin (ANCEF) IVPB 2g/100 mL premix        2 g 200 mL/hr over 30 Minutes Intravenous  Once 06/06/19 1520 06/06/19 1630       Assessment/Plan SIGSWto face-OMFS(Dr. Glenford Peers) and Ophtho (Dr. Kathlen Mody) consulted. S/P soft tissue  repairs, and CR/packing nasal fx by Dr. Glenford Peers 5/30. Definitive surgical repair6/7.Facial wound care as per Dr. Glenford Peers.  VDRF- transitionedto#[id number]/21 -decannulated 6/24 B PTX- chest tubes out 2022/07/09 Cardiac arrest- coded when trach became dislodged 5/28 Suicide attempt - psychreconsulted 7/7 and cleared patient psychiatric standpoint, will need outpatient psychiatric resources upon discharge TBI/Frontal contusion- Dr. Vertell Limber consulted, CT head 5/30 negative.MRI brain and EEG 6/7 unremarkable. B12/folate/TFTs/NH4 checked on 6/9 and all WNL. 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   LOS: 43 days    Wellington Hampshire, [facility] 07/19/2019, 8:31 AM Please see Amion for pager number during day hours 7:00am-4:30pm

## 2019-07-19 NOTE — Progress Notes (Addendum)
Inpatient Rehabilitation Medication Review by a Pharmacist  A complete drug regimen review was completed for this patient to identify any potential clinically significant medication issues.  Clinically significant medication issues were identified:  yes   Type of Medication Issue Identified Description of Issue Plan Plan Accepted by Provider? (Yes / No)  Drug Interaction(s) (clinically significant)      Duplicate Therapy  Enoxaparin 40mg  daily ordered as well as enoxaparin 40mg  q12h. Enoxaparin 40mg  q12h is VTE prophylaxis used for trauma patients.  Messaged Reesa Chew to see if she wanted to continue trauma dosing for VTE prophylaxis or transition to inpatient VTE prophylaxis now that patient is admitted to rehab.    Allergy      No Medication Administration End Date      Incorrect Dose      Additional Drug Therapy Needed      Other  Patient on multiple QT prolonging medications including prn haloperidol (using approximately 1 per day), increased dose of quetiapine and new order for on prn prochlorperazine  Recommended to consider repeat EKG to assess Qtc in next few days     Name of provider notified for issues identified: Reesa Chew, PA-C  Provider Method of Notification: [facility] Message   Pharmacist comments: N/A  Time spent performing this drug regimen review (minutes):  15 minutes   Cristela Felt, PharmD Clinical Pharmacist  07/19/2019 3:56 PM

## 2019-07-19 NOTE — Plan of Care (Signed)
  Problem: Nutrition: Goal: Adequate nutrition will be maintained Outcome: Progressing   Problem: Coping: Goal: Level of anxiety will decrease Outcome: Progressing   

## 2019-07-19 NOTE — Plan of Care (Signed)
  Problem: RH SAFETY Goal: RH STG ADHERE TO SAFETY PRECAUTIONS W/ASSISTANCE/DEVICE Description: STG Adhere to Safety Precautions With min assist Outcome: Progressing Goal: RH STG DECREASED RISK OF FALL WITH ASSISTANCE Description: STG Decreased Risk of Fall With  min Assistance. Outcome: Progressing
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
## 2019-07-19 NOTE — TOC Transition Note (Signed)
Transition of Care [facility]) - CM/SW Discharge Note   Patient Details  Name: Chase Caldwell MRN: [id number] Date of Birth: Jan 18, 1980  [facility] CM/SW Contact:  Ella Bodo, RN Phone Number: 07/19/2019, 11:03 AM   Clinical Narrative:   Pt now cleared by psych MD and is ready for IP rehab.  Plan discharge to [place] today when bed available.     Final next level of care: IP Rehab Facility Barriers to Discharge: Psych Bed not available   Patient Goals and CMS Choice   CMS Medicare.gov Compare Post Acute Care list provided to:: Patient Choice offered to / list presented to : Patient, Spouse                        Discharge Plan and Services   Discharge Planning Services: CM Consult Post Acute Care Choice: IP Rehab                               Social Determinants of Health (SDOH) Interventions     Readmission Risk Interventions No flowsheet data found.  Reinaldo Raddle, RN, BSN  Trauma/Neuro ICU Case Manager [phone]

## 2019-07-20 ENCOUNTER — Inpatient Hospital Stay (HOSPITAL_COMMUNITY): Payer: Medicaid Other

## 2019-07-20 ENCOUNTER — Inpatient Hospital Stay (HOSPITAL_COMMUNITY): Payer: Medicaid Other | Admitting: Occupational Therapy

## 2019-07-20 ENCOUNTER — Inpatient Hospital Stay (HOSPITAL_COMMUNITY): Payer: Medicaid Other | Admitting: Physical Therapy

## 2019-07-20 DIAGNOSIS — S069X9S Unspecified intracranial injury with loss of consciousness of unspecified duration, sequela: Secondary | ICD-10-CM

## 2019-07-20 DIAGNOSIS — R7401 Elevation of levels of liver transaminase levels: Secondary | ICD-10-CM

## 2019-07-20 DIAGNOSIS — T07XXXA Unspecified multiple injuries, initial encounter: Secondary | ICD-10-CM

## 2019-07-20 DIAGNOSIS — D62 Acute posthemorrhagic anemia: Secondary | ICD-10-CM

## 2019-07-20 DIAGNOSIS — R7303 Prediabetes: Secondary | ICD-10-CM

## 2019-07-20 DIAGNOSIS — R000 Tachycardia, unspecified: Secondary | ICD-10-CM

## 2019-07-20 DIAGNOSIS — G249 Dystonia, unspecified: Secondary | ICD-10-CM

## 2019-07-20 LAB — CBC WITH DIFFERENTIAL/PLATELET
Abs Immature Granulocytes: 0.03 10*3/uL (ref 0.00–0.07)
Basophils Absolute: 0 10*3/uL (ref 0.0–0.1)
Basophils Relative: 1 %
Eosinophils Absolute: 0.1 10*3/uL (ref 0.0–0.5)
Eosinophils Relative: 1 %
HCT: 38.7 % — ABNORMAL LOW (ref 39.0–52.0)
Hemoglobin: 12.5 g/dL — ABNORMAL LOW (ref 13.0–17.0)
Immature Granulocytes: 0 %
Lymphocytes Relative: 28 %
Lymphs Abs: 2.1 10*3/uL (ref 0.7–4.0)
MCH: 29.3 pg (ref 26.0–34.0)
MCHC: 32.3 g/dL (ref 30.0–36.0)
MCV: 90.8 fL (ref 80.0–100.0)
Monocytes Absolute: 0.7 10*3/uL (ref 0.1–1.0)
Monocytes Relative: 9 %
Neutro Abs: 4.6 10*3/uL (ref 1.7–7.7)
Neutrophils Relative %: 61 %
Platelets: 448 10*3/uL — ABNORMAL HIGH (ref 150–400)
RBC: 4.26 MIL/uL (ref 4.22–5.81)
RDW: 13.2 % (ref 11.5–15.5)
WBC: 7.6 10*3/uL (ref 4.0–10.5)
nRBC: 0 % (ref 0.0–0.2)

## 2019-07-20 LAB — COMPREHENSIVE METABOLIC PANEL
ALT: 69 U/L — ABNORMAL HIGH (ref 0–44)
AST: 47 U/L — ABNORMAL HIGH (ref 15–41)
Albumin: 3.2 g/dL — ABNORMAL LOW (ref 3.5–5.0)
Alkaline Phosphatase: 102 U/L (ref 38–126)
Anion gap: 13 (ref 5–15)
BUN: 12 mg/dL (ref 6–20)
CO2: 24 mmol/L (ref 22–32)
Calcium: 9.3 mg/dL (ref 8.9–10.3)
Chloride: 103 mmol/L (ref 98–111)
Creatinine, Ser: 0.87 mg/dL (ref 0.61–1.24)
GFR calc Af Amer: >60 mL/min (ref >60)
GFR calc non Af Amer: >60 mL/min (ref >60)
Glucose, Bld: 103 mg/dL — ABNORMAL HIGH (ref 70–99)
Potassium: 3.7 mmol/L (ref 3.5–5.1)
Sodium: 140 mmol/L (ref 135–145)
Total Bilirubin: 0.8 mg/dL (ref 0.3–1.2)
Total Protein: 6.8 g/dL (ref 6.5–8.1)

## 2019-07-20 LAB — GLUCOSE, CAPILLARY
Glucose-Capillary: 106 mg/dL — ABNORMAL HIGH (ref 70–99)
Glucose-Capillary: 108 mg/dL — ABNORMAL HIGH (ref 70–99)
Glucose-Capillary: 140 mg/dL — ABNORMAL HIGH (ref 70–99)
Glucose-Capillary: 75 mg/dL (ref 70–99)
Glucose-Capillary: 94 mg/dL (ref 70–99)
Glucose-Capillary: 97 mg/dL (ref 70–99)

## 2019-07-20 MED ORDER — PROPRANOLOL HCL 10 MG PO TABS: 10.0000 mg | ORAL_TABLET | Freq: Three times a day (TID) | ORAL | Status: DC

## 2019-07-20 MED ORDER — BACLOFEN 5 MG HALF TABLET
5.0000 mg | ORAL_TABLET | Freq: Three times a day (TID) | ORAL | Status: DC
  Administered 2019-07-20 – 2019-07-21 (×3): 5 mg via ORAL
  Filled 2019-07-20 (×3): qty 1

## 2019-07-20 MED ORDER — PROPRANOLOL HCL 20 MG PO TABS
20.0000 mg | ORAL_TABLET | Freq: Three times a day (TID) | ORAL | Status: DC
  Administered 2019-07-20 – 2019-07-21 (×3): 20 mg via ORAL
  Filled 2019-07-20 (×3): qty 1

## 2019-07-20 MED ORDER — PROPRANOLOL HCL 20 MG PO TABS: 30.0000 mg | ORAL_TABLET | Freq: Three times a day (TID) | ORAL | Status: DC

## 2019-07-20 NOTE — Plan of Care (Signed)
  Problem: RH BLADDER ELIMINATION Goal: RH STG MANAGE BLADDER WITH ASSISTANCE Description: STG Manage Bladder With mod I  Assistance Outcome: Not Progressing; incontinence   

## 2019-07-20 NOTE — Progress Notes (Addendum)
[facility] PHYSICAL MEDICINE & REHABILITATION PROGRESS NOTE  Subjective/Complaints: Patient seen sitting up in bed this morning.  He states he did not sleep well overnight, confirmed with nursing.  He is ready to begin therapies.  He repeatedly states that he is thirsty.  Patient removed his PEG tube overnight, replaced with Foley.  ROS: Denies CP, SOB, N/V/D  Objective: Vital Signs: Blood pressure (!) 152/92, pulse (!) 110, temperature 98.2 F (36.8 C), resp. rate 19, height 6' (1.829 m), weight 115 kg, SpO2 97 %. No results found. Recent Labs    07/20/19 0605  WBC 7.6  HGB 12.5*  HCT 38.7*  PLT 448*   Recent Labs    07/20/19 0605  NA 140  K 3.7  CL 103  CO2 24  GLUCOSE 103*  BUN 12  CREATININE 0.87  CALCIUM 9.3    Physical Exam: BP (!) 152/92 (BP Location: Right Arm)   Pulse (!) 110   Temp 98.2 F (36.8 C)   Resp 19   Ht 6' (1.829 m)   Wt 115 kg   SpO2 97%   BMI 34.38 kg/m  Constitutional: No distress . Vital signs reviewed.  Obese. HENT: Normocephalic.  Facial scars.  Dental wires in place. Eyes: Limited EOM. No discharge. Cardiovascular: No JVD. Respiratory: Normal effort.  No stridor. GI: Non-distended.  PEG tube site with Foley. Skin: Warm and dry.  Intact. Psych: Limited due to mentation Musc: No edema in extremities.  No tenderness in extremities. Neuro: Alert Right facial paralysis Dysarthria Extensor tone with constant posturing movements of BLE and intermittent extension of LUE Spasms noted with MMT Motor: Limited due to spasms and posturing, appears to 4+/5 in b/l UE when not limited by tone, unchanged Difficulty flexing hip, but able to flex knees, limited range of motion right ankle Right > Left plantar flexor tone   Assessment/Plan: 1. 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  Physiatrist is providing  close team supervision and 24 hour management of active medical problems listed below.  Physiatrist and rehab team continue to assess barriers to discharge/monitor patient progress toward functional and medical goals  Care Tool:  Bathing              Bathing assist Assist Level: 2 Helpers     Upper Body Dressing/Undressing Upper body dressing        Upper body assist Assist Level: Total Assistance - Patient < 25%    Lower Body Dressing/Undressing Lower body dressing            Lower body assist Assist for lower body dressing: 2 Helpers     Toileting Toileting    Toileting assist Assist for toileting: 2 Helpers     Transfers Chair/bed transfer  Transfers assist     Chair/bed transfer assist level: 2 Helpers     Locomotion Ambulation   Ambulation assist              Walk 10 feet activity   Assist           Walk 50 feet activity   Assist           Walk 150 feet activity   Assist           Walk 10 feet on uneven surface  activity   Assist           Wheelchair  Assist               Wheelchair 50 feet with 2 turns activity    Assist            Wheelchair 150 feet activity     Assist            Medical Problem List and Plan: 1. Diffuse weakness with tone, deficits with self-care, swallowing, cognition secondary to TBI with significant Psych history.  Begin CIR evaluations 2.  Antithrombotics: -DVT/anticoagulation:  Pharmaceutical: Lovenox             -antiplatelet therapy: N/a 3. Chronic LBP/Pain Management: Managed by tramadol prn.              Monitor with increased exertion 4. Mood: Neuropsych and psychiatry to follow during his stay. LCSW to follow for evaluation and support             -antipsychotic agents: will continue ativan prn.   See #8, #14 5. Neuropsych: This patient is not fully capable of making decisions on his own behalf. 6. Skin/Wound Care: Routine pressure  relief measures.  7. Fluids/Electrolytes/Nutrition: Monitor I/Os.  8.  Bipolar disorder: On Cymbalta, Zoloft and Seroquel for mood stabilization 9. Bouts of agitation: Continue ativan I mg bid with bid prn.  Haldol DC'd.  Has been off Versed since 07/07.  11. Facial Fx/Maxillary fracture s/p repair: Jaws wired on 06/07--> to remain in place for 6 weeks. Soft diet/non-chew diet per Dr. Glenford Peers.              Dysphagia 1 thin diet  Patient pulled out PEG, replaced with Foley, discussed with VIR-plan for PEG tube replacement on Monday 12. ABLA: Improving.    Hemoglobin 12.5 on 7/10  Continue to monitor 13. Prediabetes: Hyperglycemia due to tube feeds. Continue feeds at nights as intake remains variable.    Relatively controlled on 7/10             Monitor with increased mobility 14.  Severe muscle spasms/tone-dystonia? Posturing?: Question due to medication SE--started after addition of haldol per reports. Haldol to 5 mg IV prn DC'd.   Seroquel increased to 150 mg hs and 50 mg added in am.   Continue to monitor with d/c Haldol  No contact information for on-call psychiatry available on amion, discussed with operator-number given.  Called member, left voicemail.  Discussed with physician, however incorrect physician, awaiting callback.  Trial baclofen 5 3 times daily 15.  Tachycardia/elevated blood pressure  ?  Related #14  Metoprolol DC'd  Trial propanolol 10 3 times daily started on 7/10 16.  Transaminitis  LFTs elevated on 7/10, continue to monitor  > 35 minutes spent in total in reviewing medications discussing/attempting to reach specialists and determining plan regarding nutrition as well as meds  LOS: 1 days A FACE TO FACE EVALUATION WAS PERFORMED  Anjolina Byrer Lorie Phenix 07/20/2019, 12:59 PM

## 2019-07-20 NOTE — Plan of Care (Signed)
  Problem: RH KNOWLEDGE DEFICIT BRAIN INJURY Goal: RH STG INCREASE KNOWLEDGE OF SELF CARE AFTER BRAIN INJURY Description: Patient and caregiver will demonstrate knowledge of medication management, wound care management, dietary restrictions, and follow up care with the MD post discharge with min assist from CIR staff. Outcome: Not Progressing; confusion at times

## 2019-07-20 NOTE — Plan of Care (Signed)
Behavioral Plan   [place] Level: VI-VII  Behavior to decrease/ eliminate:  To remain calm and safe and meet this needs  Believe pulling of PEG and IV was an accident- due to difficulty with functional use of hands   Changes to environment:  -use maxi move to get pt up to a chair  Interventions: -pt is oriented to person, place and situation - pt is very thirsty all of the time- offer drinks often. -currently on night bath - pt does sweat a lot and gets hot - changing temp of room or changing sheets/ clothing can help/ fans - for optimal positioning please put a gait belt around hips instead of chair alarm (he is unable to take this off) -also benefits from lap strap around thighs to maintain hip adduction for optimal positioning -encourage pt to express himself with less words and at a slow rate so he can be understood -enjoys Reggie music  -Wife is part of his support team along with daughter; wife plans to spend night tonight 7/10  Recommendations for interactions with patient: -pt has a lot of tone and excessive movements ( can benefit from changing positions/ gentle ROM especially to wrists) -tone does decrease once up in the chair  Attendees:  Idelle Leech, Elisabeth Pigeon, [place], [place]

## 2019-07-20 NOTE — Progress Notes (Signed)
Pt pulled out PEG tube around 0520 this morning. Charge nurse and Dr. Posey Pronto notified and instructed to insert foley catheter in place of PEG tube. Foley catheter placed at 0535  patient tolerated well split gauge, tape and abdominal binder in place. No distress noted call bell in reach.
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
## 2019-07-20 NOTE — Progress Notes (Signed)
Patient is calmer; able to answer questions. Wife in room.

## 2019-07-21 ENCOUNTER — Inpatient Hospital Stay (HOSPITAL_COMMUNITY): Payer: Self-pay

## 2019-07-21 DIAGNOSIS — R030 Elevated blood-pressure reading, without diagnosis of hypertension: Secondary | ICD-10-CM

## 2019-07-21 DIAGNOSIS — R1310 Dysphagia, unspecified: Secondary | ICD-10-CM

## 2019-07-21 DIAGNOSIS — G894 Chronic pain syndrome: Secondary | ICD-10-CM

## 2019-07-21 DIAGNOSIS — M62838 Other muscle spasm: Secondary | ICD-10-CM

## 2019-07-21 LAB — GLUCOSE, CAPILLARY
Glucose-Capillary: 106 mg/dL — ABNORMAL HIGH (ref 70–99)
Glucose-Capillary: 82 mg/dL (ref 70–99)
Glucose-Capillary: 115 mg/dL — ABNORMAL HIGH (ref 70–99)
Glucose-Capillary: 133 mg/dL — ABNORMAL HIGH (ref 70–99)
Glucose-Capillary: 133 mg/dL — ABNORMAL HIGH (ref 70–99)

## 2019-07-21 MED ORDER — PROPRANOLOL HCL 20 MG PO TABS
40.0000 mg | ORAL_TABLET | Freq: Three times a day (TID) | ORAL | Status: DC
  Administered 2019-07-21 – 2019-08-03 (×39): 40 mg via ORAL
  Filled 2019-07-21 (×39): qty 2

## 2019-07-21 MED ORDER — BACLOFEN 10 MG PO TABS
10.0000 mg | ORAL_TABLET | Freq: Three times a day (TID) | ORAL | Status: DC
  Administered 2019-07-21 – 2019-08-05 (×44): 10 mg via ORAL
  Filled 2019-07-21 (×44): qty 1

## 2019-07-21 NOTE — Consult Note (Addendum)
This practitioner with Dr. Darleene Cleaver in person consulted Chase Caldwell. Patient was observed to be resting in bed, with fan blowing. He was observed having involuntary movements and jerks of his upper extremities.  He is alert and oriented x 3. He states he is containing to do well at this time. He denies any recent overt agitation, or aggressive behaviors. He reports he is doing well in physical therapy and rehab. He continues to take his medication and is compliant at this time. He is scheduled Cymbalta 60mg  po daily, Ativan 1mg  po BID, Seroquel 50mg  OQM and 150mg  po qhs, and sertraline 50mg  po. He has not required too many prn medications to include Haldol and ativan for agitation. He denies any suicidal ideations, homicidal ideations, and hallucinations. Will continue to consult on this patient as deemed necessary in conjunction with physical medicine team for complete plan of care.   Patient seen face-to-face for psychiatric evaluation, chart reviewed and case discussed with the physician extender and developed treatment plan. Reviewed the information documented and agree with the treatment plan. Corena Pilgrim, MD

## 2019-07-21 NOTE — Progress Notes (Signed)
Patient able to tolerate po med with apple sauce .

## 2019-07-21 NOTE — Progress Notes (Signed)
[facility] PHYSICAL MEDICINE & REHABILITATION PROGRESS NOTE  Subjective/Complaints: Patient seen sitting up in bed this morning.  He states he slept better overnight.  Per nurse tech, patient slept well overnight.  Patient states he feels better.  He appears less anxious with decreased frequency of spasms.  It does not appear his orthoses were donned overnight.  He states he had a good first day of therapies yesterday.  ROS: Denies CP, SOB, N/V/D  Objective: Vital Signs: Blood pressure (!) 145/83, pulse 95, temperature 97.9 F (36.6 C), resp. rate 18, height 6' (1.829 m), weight 112.9 kg, SpO2 100 %. No results found. Recent Labs    07/20/19 0605  WBC 7.6  HGB 12.5*  HCT 38.7*  PLT 448*   Recent Labs    07/20/19 0605  NA 140  K 3.7  CL 103  CO2 24  GLUCOSE 103*  BUN 12  CREATININE 0.87  CALCIUM 9.3    Physical Exam: BP (!) 145/83 (BP Location: Right Arm)   Pulse 95   Temp 97.9 F (36.6 C)   Resp 18   Ht 6' (1.829 m)   Wt 112.9 kg   SpO2 100%   BMI 33.76 kg/m  Constitutional: No distress . Vital signs reviewed. HENT: Normocephalic.  Facial scarring.  Dental wires in place. Eyes: Extraocular muscles limited, difficulty tracking.  No discharge. Cardiovascular: No JVD. Respiratory: Normal effort.  No stridor. GI: Non-distended.  PEG site with Foley in place. Skin: Warm and dry.  Intact. Psych: Flat. Musc: No edema in extremities.  No tenderness in extremities. Neuro: Alert Facial paralysis Dysarthria Extensor tone with constant posturing movements of BLE and intermittent extension of LUE, unchanged Spasms noted with MMT, some improvement Motor: Limited due to spasms and posturing, appears to 4+/5 in b/l UE when not limited by tone, stable Difficulty flexing hip, but able to flex knees, limited range of motion right ankle Right > Left plantar flexor tone , stable  Assessment/Plan: 1. 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  Physiatrist is providing close team supervision and 24 hour management of active medical problems listed below.  Physiatrist and rehab team continue to assess barriers to discharge/monitor patient progress toward functional and medical goals  Care Tool:  Bathing              Bathing assist Assist Level: 2 Helpers     Upper Body Dressing/Undressing Upper body dressing        Upper body assist Assist Level: Total Assistance - Patient < 25%    Lower Body Dressing/Undressing Lower body dressing            Lower body assist Assist for lower body dressing: 2 Helpers     Toileting Toileting    Toileting assist Assist for toileting: 2 Helpers     Transfers Chair/bed transfer  Transfers assist  Chair/bed transfer activity did not occur: Safety/medical concerns (safety/medical b/c we introduced new technique??)  Chair/bed transfer assist level: 2 Helpers (slide board)     Locomotion Ambulation   Ambulation assist   Ambulation activity did not occur: Safety/medical concerns          Walk 10 feet activity   Assist  Walk 10 feet activity did not occur: Safety/medical concerns        Walk 50 feet activity   Assist Walk 50 feet with 2 turns activity did not occur:  Safety/medical concerns         Walk 150 feet activity   Assist Walk 150 feet activity did not occur: Safety/medical concerns         Walk 10 feet on uneven surface  activity   Assist Walk 10 feet on uneven surfaces activity did not occur: Safety/medical concerns         Wheelchair     Assist Will patient use wheelchair at discharge?:  (likely will require dependent w/c transport)             Wheelchair 50 feet with 2 turns activity    Assist            Wheelchair 150 feet activity     Assist            Medical Problem List and  Plan: 1. Diffuse weakness with tone, deficits with self-care, swallowing, cognition secondary to TBI with significant Psych history.  Continue CIR  Bilateral PRAFO qhs 2.  Antithrombotics: -DVT/anticoagulation:  Pharmaceutical: Lovenox             -antiplatelet therapy: N/a 3. Chronic LBP/Pain Management: Managed by tramadol prn.   Appears controlled on 7/11             Monitor with increased exertion 4. Mood: Neuropsych and psychiatry to follow during his stay. LCSW to follow for evaluation and support             -antipsychotic agents: will continue ativan prn.   See #8, #14 5. Neuropsych: This patient is not fully capable of making decisions on his own behalf. 6. Skin/Wound Care: Routine pressure relief measures.  7. Fluids/Electrolytes/Nutrition: Monitor I/Os.  8.  Bipolar disorder: On Cymbalta, Zoloft and Seroquel for mood stabilization 9. Bouts of agitation: Continue ativan I mg bid with bid prn.  Haldol DC'd.  Has been off Versed since 07/07.  11. Facial Fx/Maxillary fracture s/p repair: Jaws wired on 06/07--> to remain in place for 6 weeks. Soft diet/non-chew diet per Dr. Glenford Peers.              Dysphagia 1 thin diet  Patient pulled out PEG, replaced with Foley, discussed with VIR-plan for PEG tube replacement tomorrow  N.p.o. after midnight 12. ABLA: Improving.    Hemoglobin 12.5 on 7/10  Continue to monitor 13. Prediabetes: Hyperglycemia due to tube feeds. Continue feeds at nights as intake remains variable.    Relatively controlled on 7/11             Monitor with increased mobility 14.  Severe muscle spasms/tone-dystonia? Posturing?: Question due to medication SE--started after addition of haldol per reports. Haldol to 5 mg IV prn DC'd.   Seroquel increased to 150 mg hs and 50 mg added in am.   Continue to monitor with d/c Haldol  No contact information for on-call psychiatry available on amion, discussed with operator-number given.  Called member, left voicemail.  Discussed  with physician, however incorrect physician, awaiting callback, still no callback.  Baclofen increased to 10 3 times daily on 7/11  See #15  Some improvement 15.  Tachycardia/elevated blood pressure  ?  Related #14  Metoprolol DC'd  Propanolol increased to 40 3 times daily on 7/11 16.  Transaminitis  LFTs elevated on 7/10, continue to monitor  LOS: 2 days A FACE TO FACE EVALUATION WAS PERFORMED  Ankit Lorie Phenix 07/21/2019, 8:30 AM

## 2019-07-21 NOTE — Progress Notes (Signed)
Physical Therapy Session Note  Patient Details  Name: Chase Caldwell MRN: [id number] Date of Birth: Jun 25, 1980  Today's Date: 07/21/2019 PT Individual Time: 7408-1448 PT Individual Time Calculation (min): 45 min   Short Term Goals: Week 1:  PT Short Term Goal 1 (Week 1): Pt will perform supine>sit with max assist of 1 person PT Short Term Goal 2 (Week 1): Pt will perform sit>supine with +2 max assist PT Short Term Goal 3 (Week 1): Pt will perform bed<>chair transfers with +2 max assist PT Short Term Goal 4 (Week 1): Pt will perform sit<>stands with +2 max assist PT Short Term Goal 5 (Week 1): Pt will maintain static sitting balance EOM for at least 1 minute with supervision  Skilled Therapeutic Interventions/Progress Updates:     Patient in bed asleep with his wife in the room upon PT arrival. Patient initially very difficult to arouse, required sternal rub to open eyes and illicit verbal response and would immediately fall back asleep without constant stimulation. Vitals: BP 148/85, HR 80, SPO2 97%. [name] wife reports patient did not sleep last night due to muscle spasms/tone, stating he had difficulty getting comfortable. Patient placed in chair position in the bed and became more aroused, but still lethargic, and agreeable to PT session, stated "I want to do therapy." Patient reported unrated L wrist pain during session, RN made aware. PT provided repositioning, rest breaks, and distraction as pain interventions throughout session.   Therapeutic Activity: Bed Mobility: Patient performed supine to/from sit with mod-max A +2 for trunk and LE managment. Provided verbal cues for initiation, noted increased flexor tone of all extremities with trunk flexion to come to sitting. Required total A for scooting EOB. Placed patient with feet flat on the floor with manual facilitation and noted improved tone in sitting. Patient with eyes closed in sitting, able to open eyes following simple verbal cues,  reported increased sensitivity to light with eyes open, provided patient with sunglasses with improved symptoms.   Neuromuscular Re-ed: Patient performed the following sitting balance activities >10 min with mod A of 1-2 people for balance: -static sitting focus on midline orientation for reduced posterior lean and improved sitting posture with facilitation to scapula and chest -reaching with B UEs focused on breaking synergistic patterns with trunk and shoulder flexion and elbow extension then with elbow flexion and finger extension -lateral leans 2x5 with cues to maintain trunk in midline throughout to reduce posterior bias -cued patient to maintaining B feet flat on the floor with knees over toes during activities, patient unable to place medial side of his R foot on the floor due to equinovarus contracture, however, able to initiate positioning independently with manual over pressure for stretch, patient denied pain with this positioning Patient would intermittently fall asleep during activities and require max cues to maintain arousal. Did participate with conversation about his family and his dogs in sitting to maintain arousal.  Patient in bed with HOB elevated to encourage arousal at end of session with breaks locked, bed alarm set, and all needs within reach. RN made aware of patient's lethargy following session.    Therapy Documentation Precautions:  Precautions Precautions: Fall, Other (comment) Precaution Comments: PEG, significant hypertonia, right ankle PF contracture Required Braces or Orthoses: Other Brace Other Brace: abdominal binder (to keep PEG safe) Restrictions Weight Bearing Restrictions: No    Therapy/Group: Individual Therapy  Chase Caldwell PT, DPT  07/21/2019, 4:45 PM

## 2019-07-21 NOTE — Plan of Care (Signed)
  Problem: RH BOWEL ELIMINATION Goal: RH STG MANAGE BOWEL WITH ASSISTANCE Description: STG Manage Bowel with mod I Assistance. 07/21/2019 1029 by Ander Slade, RN Outcome: Not Progressing; incontinence   Problem: RH BLADDER ELIMINATION Goal: RH STG MANAGE BLADDER WITH ASSISTANCE Description: STG Manage Bladder With mod I Assistance Outcome: Not Progressing; incontinence

## 2019-07-22 ENCOUNTER — Inpatient Hospital Stay (HOSPITAL_COMMUNITY): Payer: Medicaid Other | Admitting: Occupational Therapy

## 2019-07-22 ENCOUNTER — Inpatient Hospital Stay (HOSPITAL_COMMUNITY): Payer: Medicaid Other

## 2019-07-22 ENCOUNTER — Other Ambulatory Visit: Payer: Self-pay

## 2019-07-22 ENCOUNTER — Inpatient Hospital Stay (HOSPITAL_COMMUNITY): Payer: Medicaid Other | Admitting: Speech Pathology

## 2019-07-22 DIAGNOSIS — S069X3S Unspecified intracranial injury with loss of consciousness of 1 hour to 5 hours 59 minutes, sequela: Secondary | ICD-10-CM

## 2019-07-22 HISTORY — PX: IR REPLC GASTRO/COLONIC TUBE PERCUT W/FLUORO: IMG2333

## 2019-07-22 LAB — CBC
HCT: 38.7 % — ABNORMAL LOW (ref 39.0–52.0)
Hemoglobin: 12.2 g/dL — ABNORMAL LOW (ref 13.0–17.0)
MCH: 28.8 pg (ref 26.0–34.0)
MCHC: 31.5 g/dL (ref 30.0–36.0)
MCV: 91.3 fL (ref 80.0–100.0)
Platelets: 448 10*3/uL — ABNORMAL HIGH (ref 150–400)
RBC: 4.24 MIL/uL (ref 4.22–5.81)
RDW: 13.6 % (ref 11.5–15.5)
WBC: 6.9 10*3/uL (ref 4.0–10.5)
nRBC: 0 % (ref 0.0–0.2)

## 2019-07-22 LAB — GLUCOSE, CAPILLARY
Glucose-Capillary: 102 mg/dL — ABNORMAL HIGH (ref 70–99)
Glucose-Capillary: 104 mg/dL — ABNORMAL HIGH (ref 70–99)
Glucose-Capillary: 126 mg/dL — ABNORMAL HIGH (ref 70–99)
Glucose-Capillary: 95 mg/dL (ref 70–99)
Glucose-Capillary: 95 mg/dL (ref 70–99)
Glucose-Capillary: 99 mg/dL (ref 70–99)

## 2019-07-22 LAB — BASIC METABOLIC PANEL
Anion gap: 9 (ref 5–15)
BUN: 9 mg/dL (ref 6–20)
CO2: 26 mmol/L (ref 22–32)
Calcium: 9 mg/dL (ref 8.9–10.3)
Chloride: 105 mmol/L (ref 98–111)
Creatinine, Ser: 0.75 mg/dL (ref 0.61–1.24)
GFR calc Af Amer: >60 mL/min (ref >60)
GFR calc non Af Amer: >60 mL/min (ref >60)
Glucose, Bld: 100 mg/dL — ABNORMAL HIGH (ref 70–99)
Potassium: 3.5 mmol/L (ref 3.5–5.1)
Sodium: 140 mmol/L (ref 135–145)

## 2019-07-22 MED ORDER — GABAPENTIN 250 MG/5ML PO SOLN
100.0000 mg | Freq: Three times a day (TID) | ORAL | Status: DC
  Administered 2019-07-22 – 2019-07-30 (×24): 100 mg via ORAL
  Filled 2019-07-22 (×26): qty 2

## 2019-07-22 MED ORDER — LIDOCAINE VISCOUS HCL 2 % MT SOLN
OROMUCOSAL | Status: AC
  Filled 2019-07-22: qty 15

## 2019-07-22 NOTE — Progress Notes (Addendum)
[facility] PHYSICAL MEDICINE & REHABILITATION PROGRESS NOTE  Subjective/Complaints: Pt was agitated last night, but it seems that this was largely d/t pain (in back and d/t cramping,spasms). Appeared generally comfortable this morning. Still with a lot of tone  ROS: Limited due to cognitive/behavioral     Objective: Vital Signs: Blood pressure (!) 146/91, pulse 80, temperature 97.6 F (36.4 C), resp. rate 18, height 6' (1.829 m), weight 112.9 kg, SpO2 98 %. IR Replc Gastro/Colonic Tube Percut W/Fluoro  Result Date: 07/22/2019 INDICATION: Malfunctioning gastrostomy tube. Please perform fluoroscopic guided exchange. EXAM: FLUOROSCOPIC GUIDED REPLACEMENT OF GASTROSTOMY TUB COMPARISON:  Fluoroscopic guided gastrostomy tube exchange-07/11/2019. MEDICATIONS: None. CONTRAST:  10 cc Omnipaque 300 - administered into the gastric lumen FLUOROSCOPY TIME:  12 seconds (7 mGy) COMPLICATIONS: None immediate. PROCEDURE: The patient was positioned supine on fluoroscopy table. The existing gastrostomy tube was removed intact. Next, a new 18-French MIC balloon inflatable gastrostomy tube was inserted via the chronic gastrostomy tube track. The balloon was inflated and disc was cinched. Contrast was injected and several spot fluoroscopic images were obtained in various obliquities to confirm appropriate intraluminal positioning. A dressing was applied. The patient tolerated the procedure well without immediate postprocedural complication. IMPRESSION: Successful fluoroscopic guided replacement of a new 18-French gastrostomy tube. The new gastrostomy tube is ready for immediate use. Electronically Signed   By: Sandi Mariscal M.D.   On: 07/22/2019 10:33   Recent Labs    07/20/19 0605 07/22/19 0739  WBC 7.6 6.9  HGB 12.5* 12.2*  HCT 38.7* 38.7*  PLT 448* 448*   Recent Labs    07/20/19 0605 07/22/19 0739  NA 140 140  K 3.7 3.5  CL 103 105  CO2 24 26  GLUCOSE 103* 100*  BUN 12 9  CREATININE 0.87 0.75   CALCIUM 9.3 9.0    Physical Exam: BP (!) 146/91 (BP Location: Right Arm)   Pulse 80   Temp 97.6 F (36.4 C)   Resp 18   Ht 6' (1.829 m)   Wt 112.9 kg   SpO2 98%   BMI 33.76 kg/m  Constitutional: No distress . Vital signs reviewed. HEENT: EOMI, oral membranes moist Neck: supple Cardiovascular: RRR without murmur. No JVD    Respiratory/Chest: CTA Bilaterally without wheezes or rales. Normal effort    GI/Abdomen: BS +, non-tender, non-distended Ext: no clubbing, cyanosis, or edema Psych: flat, delayed, cooperative Musc: No edema in extremities.  No tenderness in extremities. Neuro: Alert Facial paralysis Dysarthria Extensor tone with constant posturing movements of BLE and extension of LUE.>RUE. DTR's ?1+ UE and 3+ LE's. Sustained clonus in both LE. right heel cord contracture.  Decreased LT in both arms and leg. Above shoulders/lower neck/clavicular area sensation seems improved Motor: limited by tone and participation.     Assessment/Plan: 1. 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  Physiatrist is providing close team supervision and 24 hour management of active medical problems listed below.  Physiatrist and rehab team continue to assess barriers to discharge/monitor patient progress toward functional and medical goals  Care Tool:  Bathing              Bathing assist Assist Level: 2 Helpers     Upper Body Dressing/Undressing Upper body dressing        Upper body assist Assist Level: Total Assistance - Patient < 25%    Lower Body Dressing/Undressing Lower body dressing  Lower body assist Assist for lower body dressing: 2 Helpers     Toileting Toileting    Toileting assist Assist for toileting: 2 Helpers     Transfers Chair/bed transfer  Transfers assist  Chair/bed transfer activity did not  occur: Safety/medical concerns (safety/medical b/c we introduced new technique??)  Chair/bed transfer assist level: 2 Helpers (slide board)     Locomotion Ambulation   Ambulation assist   Ambulation activity did not occur: Safety/medical concerns          Walk 10 feet activity   Assist  Walk 10 feet activity did not occur: Safety/medical concerns        Walk 50 feet activity   Assist Walk 50 feet with 2 turns activity did not occur: Safety/medical concerns         Walk 150 feet activity   Assist Walk 150 feet activity did not occur: Safety/medical concerns         Walk 10 feet on uneven surface  activity   Assist Walk 10 feet on uneven surfaces activity did not occur: Safety/medical concerns         Wheelchair     Assist Will patient use wheelchair at discharge?:  (likely will require dependent w/c transport)             Wheelchair 50 feet with 2 turns activity    Assist            Wheelchair 150 feet activity     Assist            Medical Problem List and Plan: 1. Diffuse weakness with hypertonicity and deficits with self-care, swallowing, cognition secondary to TBI with significant Psych history.  Continue CIR  Bilateral PRAFO qhs--would benefit from bi-valve/serial casting RLE.  Could consider a tension PRAFO. Will discuss further with team 2.  Antithrombotics: -DVT/anticoagulation:  Pharmaceutical: Lovenox             -antiplatelet therapy: N/a 3. Chronic LBP/Pain Management: Managed by tramadol prn.   Inconsistent control. May have component of neuropathic pain             will add gabapentin which will help with mood stabilization also  -pt took hydrocodone at home it appears. Will speak with wife re: this med 4. Mood/agitation/hx of bipolar disorder: Neuropsych and psychiatry to follow during his stay. LCSW to follow for evaluation and support             -antipsychotic agents:  seroquel  -continue  cymbalta, zoloft  -add gabapentin as above  -normalize sleep patterns  -ativan BID and prn, no haldol!  -psych also following this patient 5. Neuropsych: This patient is not fully capable of making decisions on his own behalf. 6. Skin/Wound Care: Routine pressure relief measures.  7. Fluids/Electrolytes/Nutrition: Monitor I/Os.  11. Facial Fx/Maxillary fracture s/p repair with associated dysphagia: Jaws wired on 06/07--> to remain in place for 6 weeks. Soft diet/non-chew diet per Dr. Glenford Peers.              Dysphagia 1 thin diet  Patient pulled out PEG, replaced with Foley, new 18 Fr gastrostomy tube replaced by radiology this morning. Appreciate their assist  12. ABLA: Improving.    Hemoglobin 12.2 on 7/12  Continue to monitor 13. Prediabetes: Hyperglycemia due to tube feeds. Continue feeds at nights as intake remains variable.    Relatively controlled on 7/12             Monitor with increased mobility 14.  Severe muscle spasms/tone-dystonia? Posturing?:    -no improvement really after stopping haldol  -MRI of head and neck do not indicate an injury which would lead to clinical presentation we're seeing   Baclofen increased to 10 3 times daily on 7/11  Gabapentin as above  Splinting/rom with therapy 15.  Tachycardia/elevated blood pressure  Continue propanolol which was increased to 40 3 times daily on 7/11 16.  Transaminitis  LFTs elevated on 7/10, continue to monitor   LOS: 3 days A FACE TO FACE EVALUATION WAS PERFORMED  Meredith Staggers 07/22/2019, 10:53 AM

## 2019-07-22 NOTE — Progress Notes (Signed)
Physical Therapy Session Note  Patient Details  Name: Chase Caldwell MRN: [id number] Date of Birth: 1980/05/11  Today's Date: 07/22/2019 PT Individual Time: 0800-0900 PT Individual Time Calculation (min): 60 min   Short Term Goals: Week 1:  PT Short Term Goal 1 (Week 1): Pt will perform supine>sit with max assist of 1 person PT Short Term Goal 2 (Week 1): Pt will perform sit>supine with +2 max assist PT Short Term Goal 3 (Week 1): Pt will perform bed<>chair transfers with +2 max assist PT Short Term Goal 4 (Week 1): Pt will perform sit<>stands with +2 max assist PT Short Term Goal 5 (Week 1): Pt will maintain static sitting balance EOM for at least 1 minute with supervision  Skilled Therapeutic Interventions/Progress Updates:     Patient in bed asleep with his wife in the room upon PT arrival. Patient easily aroused, but mildly lethargic throughout session, RN made aware and agreeable to PT session. Patient reported 10/10 "all over" pain during muscle spasms and L wrist and shoulder pain during session, RN made aware and provided pain medicine during session. PT provided repositioning, rest breaks, and distraction as pain interventions throughout session.   Therapeutic Activity: Bed Mobility: Patient attempted supine to/from sit with max A, however, caused increased muscle spasms with flexor tone and pain and patient requested to return to supine before reaching a seated position. Provided verbal cues for LE and trunk movement and pushing up with elbows to sit.  Focused remainder of session on PROM stretching and AAROM-AROM of all extremities for reduced tone, pain management, and improved positioning. Also provided education to patient's wife about ROM exercises and use of gentle pressure to calm spasms or activation of flexor tone. Patient with intermittent B UE flexor tone L>R and alternating flexor and extensor tone on B LEs R>L based on trunk position. R foot remained in equinovarus  positioning with minimal flexibility or ROM. Patient also with intermittent quick jerking movements in all extremities, mostly L UE, followed by increased muscle spasm. Discussed patient's presentation with MD when he rounded at end of session.   Neuromuscular Re-ed: Patient performed the following ROM activities for all extremities for reduced tone and improved motor control: -B UE finger, wrist, elbow, and shoulder flexion/extension progressing from PROM to AAROM on L and AROM on R -B LE ankle PF/DF on L, stretched R ankle with minimal mobility due to equinovarus positioning with increased tone,  -B LE knee flexion/extension and hip abduction/adduction progressing form PROM to AAROM  -Utilized gentle pressure to active/contracted muscle for inhibition to reduce tone when activated by ROM activities throughout  Patient in bed preparing to go off unit for procedure at end of session with breaks locked, bed alarm set, and all needs within reach.    Therapy Documentation Precautions:  Precautions Precautions: Fall, Other (comment) Precaution Comments: PEG, significant hypertonia, right ankle PF contracture Required Braces or Orthoses: Other Brace Other Brace: abdominal binder (to keep PEG safe) Restrictions Weight Bearing Restrictions: No   Therapy/Group: Individual Therapy  Shreyansh Tiffany L Nelvin Tomb PT, DPT  07/22/2019, 12:42 PM

## 2019-07-22 NOTE — Progress Notes (Signed)
Patient arrived on floor stable with no complaints of pain

## 2019-07-22 NOTE — Progress Notes (Signed)
Inpatient Rehabilitation  Patient information reviewed and entered into eRehab system by Wendel Homeyer M. Paizleigh Wilds, M.A., CCC/SLP, PPS Coordinator.  Information including medical coding, functional ability and quality indicators will be reviewed and updated through discharge.    

## 2019-07-22 NOTE — Progress Notes (Signed)
Pt has had a difficult tonight with pain control and restlessness. Pt is NPO after midnight for procedure to replace peg tube. Pt awoke screaming and crying from pain. Pt c/o of severe pain generalized but particularly his back and his hands that are cramping.  Scheduled Ativan 1mg  was given via peg tube ( 20:39) along with prn Tramadol 50 mg (20:37). Pt fell asleep for a bit. Then awoke again screaming and crying. Prior to starting NPO orders at 00:00, the Pt was given scheduled Tylenol (23:54), PRN Ativan 1 mg (23:56) and PRN Tramadol 50 mg (23:55). Pt woke up again in the same state and was again given Scheduled (06:00) Tylenol early  (04:19) and PRN Tramadol early (04:21). Pt is resting soundly at this time. Will continue to monitor. Meds given after midnight were administered via peg tube with small amount of water to mix and flush. Will continue to monitor.
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
## 2019-07-22 NOTE — Progress Notes (Signed)
Orthopedic Tech Progress Note Patient Details:  Chase Caldwell 1980-12-31 [id number] Called in order to [facility] for a resting who splint bilaterally  Patient ID: Chase Caldwell, male   DOB: [birth year], 39 y.o.   MRN: [id number]   [name] 07/22/2019, 12:18 PM

## 2019-07-22 NOTE — Progress Notes (Signed)
Speech Language Pathology Daily Session Note  Patient Details  Name: Chase Caldwell MRN: [id number] Date of Birth: 01-02-1981  Today's Date: 07/22/2019 SLP Individual Time: 1025-1120 SLP Individual Time Calculation (min): 55 min  Short Term Goals: Week 1: SLP Short Term Goal 1 (Week 1): Pt will participate in continued assessment of cognitive lingustic skills. SLP Short Term Goal 2 (Week 1): Pt communicate wants/needs at the phrase level with min A verbal cues for 80% intelligibility. SLP Short Term Goal 3 (Week 1): Pt will produce specific speech sounds targeting alevolar, palatal and velar placement with 60% accuracy. SLP Short Term Goal 4 (Week 1): Pt will consume dys 1 textures and thin liquids with minimal s/s aspriation and mod A verbal cues for use of swallow strategies. SLP Short Term Goal 5 (Week 1): Pt will demonstrate recall of novel and daily information with min A verbal cues.  Skilled Therapeutic Interventions: Skilled treatment session focused on cognitive and speech goals. Patient had recently returned from getting his PEG tube replaced and appeared lethargic. Patient awakened to voice and agreeable to participate in treatment session. Intermittent verbal cues were needed throughout the session for attention due to lethargy. SLP facilitated session by administering the MoCA-BLIND. Patient scored 19/22 points with a score of 19 or above considered normal. Patient demonstrated deficits in short-term memory and verbal fluency, however, suspect verbal fluency score was impacted by decreased attention to task.  Patient required total A to recall events from a previous therapy session but was ~80% intelligible at the phrase and sentence level with overall supervision level verbal cues for overt-articulation. Patient left supine in bed with alarm on and all needs within reach. Continue with current plan of care.      Pain Pain, unable to specify. RN aware and administered medications    Therapy/Group: Individual Therapy  Jairon Ripberger 07/22/2019, 12:18 PM

## 2019-07-22 NOTE — Progress Notes (Signed)
Occupational Therapy Session Note  Patient Details  Name: Chase Caldwell MRN: [id number] Date of Birth: 1980-01-30  Today's Date: 07/22/2019 OT Individual Time: 1400-1453 OT Individual Time Calculation (min): 53 min    Short Term Goals: Week 1:  OT Short Term Goal 1 (Week 1): Pt will sitEOB with mod A in prep for functional ADL task OT Short Term Goal 2 (Week 1): Pt will self feed with AE with max A OT Short Term Goal 3 (Week 1): Pt will tolerate sitting upright out of bed for ~3 hrs OT Short Term Goal 4 (Week 1): Pt will roll to right and left with max A of one caregiver  Skilled Therapeutic Interventions/Progress Updates:    Treatment session with focus on bed mobility, sitting balance, functional use of BUE, and arousal.  Pt received asleep in bed, easily aroused to voice. Engaged in PROM progressing to [facility] in [place] with focus on functional movements to increase participation in self-care tasks.  Engaged in washing face with LUE with hand over hand assist to maintain grasp on washcloth.  Utilized RUE with hand over hand to maintain grasp and increase shoulder flexion to wash hair.  Pt with inconsistent and uncontrolled extension and flexion in BUE throughout activities.  Engaged in bed mobility with max assist +2 to roll to Lt and mod-max assist +2 sidelying to sitting at EOB.  Pt fluctuated between requiring mod assist and CGA for static sitting balance at EOB.  Pt tolerated ~4 mins seated EOB before demonstrating decreased arousal.  Returned to supine max assist +2 and repositioned in semi-reclined position in bed.  Therapist reapplied WHO to BUE while educating on proper fit and rationale.  Remained semi-reclined with eyes closed, falling asleep, with 4 side rails up and bed alarm on.  Pt's wife arrived while seated EOB and provided encouragement intermittently during session.   Therapy Documentation Precautions:  Precautions Precautions: Fall, Other (comment) Precaution Comments: PEG,  significant hypertonia, right ankle PF contracture Required Braces or Orthoses: Other Brace Other Brace: abdominal binder (to keep PEG safe) Restrictions Weight Bearing Restrictions: No General:   Vital Signs: Therapy Vitals Temp: 98.1 F (36.7 C) Pulse Rate: 77 Resp: 19 BP: (!) 137/95 Patient Position (if appropriate): Lying Oxygen Therapy SpO2: 98 % O2 Device: Room Air Pain:  Pt with c/o pain in LLE and LUE with movement.  Repositioned.     Therapy/Group: Individual Therapy  Simonne Come 07/22/2019, 2:58 PM
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
## 2019-07-22 NOTE — Procedures (Signed)
Pre procedural Dx: Dysphagia, poorly functioning feeding tube. Post procedural Dx: Same  Successful fluoroscopic guided replacement of exisitng 18 Fr gastrostomy tube.   The feeding tube is ready for immediate use.  EBL: None  Complications: None immediate.  Jay Makaiah Terwilliger, MD Pager #: [phone]  

## 2019-07-22 NOTE — Progress Notes (Addendum)
Patient ID: Chase Caldwell, male   DOB: 27-Apr-1980, 39 y.o.   MRN: [id number]  SW spoke with pt wife Chase Caldwell [phone]) to discuss d/c plan. Pt will have 24/7 care from wife and his parents will be returning from [place] to assist with caring for him. SW provided updates on ELOS, and will follow-up after team conference.     Loralee Pacas, MSW, [place] Office: [phone] Cell: [phone] Fax: [phone]

## 2019-07-22 NOTE — Care Management (Addendum)
[facility] Individual Statement of Services  Patient Name:  Chase Caldwell  Date:  07/22/2019  Welcome to the [facility].  Our goal is to provide you with an individualized program based on your diagnosis and situation, designed to meet your specific needs.  With this comprehensive rehabilitation program, you will be expected to participate in at least 3 hours of rehabilitation therapies Monday-Friday, with modified therapy programming on the weekends.  Your rehabilitation program will include the following services:  Physical Therapy (PT), Occupational Therapy (OT), Speech Therapy (ST), 24 hour per day rehabilitation nursing, Therapeutic Recreaction (TR), Psychology, Neuropsychology, Care Coordinator, Rehabilitation Medicine, Nutrition Services, Pharmacy Services and Other  Weekly team conferences will be held on Tuesdays to discuss your progress.  Your Inpatient Rehabilitation Care Coordinator will talk with you frequently to get your input and to update you on team discussions.  Team conferences with you and your family in attendance may also be held.  Expected length of stay: 22-30 days  Overall anticipated outcome:  Moderate Assistance  Depending on your progress and recovery, your program may change. Your Inpatient Rehabilitation Care Coordinator will coordinate services and will keep you informed of any changes. Your Inpatient Rehabilitation Care Coordinator's name and contact numbers are listed  below.  The following services may also be recommended but are not provided by the [facility] will be made to provide these services after discharge if needed.  Arrangements include referral to agencies that provide these services.  Your insurance has been verified to be:  Medicaid  Your  primary doctor is:  [place]  Pertinent information will be shared with your doctor and your insurance company.  Inpatient Rehabilitation Care Coordinator:  Cathleen Corti [phone] or ([phone]  Information discussed with and copy given to patient by: Rana Snare, 07/22/2019, 12:53 PM

## 2019-07-23 ENCOUNTER — Inpatient Hospital Stay (HOSPITAL_COMMUNITY): Payer: Medicaid Other

## 2019-07-23 ENCOUNTER — Inpatient Hospital Stay (HOSPITAL_COMMUNITY): Payer: Medicaid Other | Admitting: Speech Pathology

## 2019-07-23 ENCOUNTER — Inpatient Hospital Stay (HOSPITAL_COMMUNITY): Payer: Medicaid Other | Admitting: Occupational Therapy

## 2019-07-23 DIAGNOSIS — R252 Cramp and spasm: Secondary | ICD-10-CM

## 2019-07-23 LAB — GLUCOSE, CAPILLARY
Glucose-Capillary: 107 mg/dL — ABNORMAL HIGH (ref 70–99)
Glucose-Capillary: 118 mg/dL — ABNORMAL HIGH (ref 70–99)
Glucose-Capillary: 122 mg/dL — ABNORMAL HIGH (ref 70–99)
Glucose-Capillary: 86 mg/dL (ref 70–99)
Glucose-Capillary: 99 mg/dL (ref 70–99)

## 2019-07-23 MED ORDER — DANTROLENE SODIUM 25 MG PO CAPS
25.0000 mg | ORAL_CAPSULE | Freq: Two times a day (BID) | ORAL | Status: DC
  Administered 2019-07-23 – 2019-07-24 (×3): 25 mg
  Filled 2019-07-23 (×3): qty 1
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
## 2019-07-23 NOTE — Progress Notes (Signed)
Physical Therapy Session Note  Patient Details  Name: Chase Caldwell MRN: [id number] Date of Birth: 09/17/1980  Today's Date: 07/23/2019 PT Individual Time: 1045-1130 PT Individual Time Calculation (min): 45 min   Short Term Goals: Week 1:  PT Short Term Goal 1 (Week 1): Pt will perform supine>sit with max assist of 1 person PT Short Term Goal 2 (Week 1): Pt will perform sit>supine with +2 max assist PT Short Term Goal 3 (Week 1): Pt will perform bed<>chair transfers with +2 max assist PT Short Term Goal 4 (Week 1): Pt will perform sit<>stands with +2 max assist PT Short Term Goal 5 (Week 1): Pt will maintain static sitting balance EOM for at least 1 minute with supervision  Skilled Therapeutic Interventions/Progress Updates:    Session focused on NMR to address postural control re-training, tone management, weightbearing, graded movements, and functional mobility including bed mobility (supine <> sit, rolling, and prone positioning), transfers, and sit <> stands.   Pt still up in w/c from earlier session and tolerating well.   Performed w/c <> mat table transfers with max +2 assistance with slideboard with multimodal cues for facilitation of anterior weightshift, technique, and head/hips relationship. Total assist for management and positioning of RLE during transfer. Focused on pt maintaining BUE crossed in lap to decrease available range for activation of tone.   Functional dynamic sitting balance EOM progressing to CGA overall with verbal cues for reorientation to midline and improve overall posture.  +2 assist for bed mobility and transitional movements to achieve prone position (~10-15 min total) with use of pillows for support under belly and wedge under shoulders. Pt tolerates prone position well to benefit from stretching, noted decrease in overall tone, and pt reporting feeling "amazeballs". Passive stretching to BLE with decreased tightness noted and able to achieve -45 degrees  lacking to neutral at R ankle. Pt able to initiate roll from prone towards R with min assist to facilitate. Discussed with MD in regards to serial casting.   Sit <> stands from EOM with +2-3 assist for safety and blocking of R ankle (no inversion noted this morning when performed sit <> stand) with focus on achieving upright trunk without eliciting extensor tone through trunk, control in B knee to decrease hyperextension ( decreased weightbearing noted to R side) with mod +2 assist for balance and sit <> stand.   PT adjusted back height of specialty back in [name] for improved positioning and sitting tolerance prior to transfer back to w/c.    Positioned in chair with lap belt around pelvis and around thighs to promote neutral alignment. Left at nurses's station.  Therapy Documentation Precautions:  Precautions Precautions: Fall, Other (comment) Precaution Comments: PEG, significant hypertonia, right ankle PF contracture Required Braces or Orthoses: Other Brace Other Brace: abdominal binder (to keep PEG safe) Restrictions Weight Bearing Restrictions: No  Pain: Premedicated. Reports pain with spasms/tone and repositioned for comfort.    Therapy/Group: Individual Therapy  Canary Brim Ivory Broad, PT, DPT, CBIS  07/23/2019, 1:08 PM

## 2019-07-23 NOTE — Progress Notes (Addendum)
[place] PHYSICAL MEDICINE & REHABILITATION PROGRESS NOTE  Subjective/Complaints: Pt states he had a better night. I spoke to wife who says he was complaining of pain and spasms in the left hand.   ROS: limited by behavior.     Objective: Vital Signs: Blood pressure (!) 152/90, pulse 83, temperature 97.8 F (36.6 C), resp. rate 19, height 6' (1.829 m), weight 113 kg, SpO2 100 %. IR Replc Gastro/Colonic Tube Percut W/Fluoro  Result Date: 07/22/2019 INDICATION: Malfunctioning gastrostomy tube. Please perform fluoroscopic guided exchange. EXAM: FLUOROSCOPIC GUIDED REPLACEMENT OF GASTROSTOMY TUB COMPARISON:  Fluoroscopic guided gastrostomy tube exchange-07/11/2019. MEDICATIONS: None. CONTRAST:  10 cc Omnipaque 300 - administered into the gastric lumen FLUOROSCOPY TIME:  12 seconds (7 mGy) COMPLICATIONS: None immediate. PROCEDURE: The patient was positioned supine on fluoroscopy table. The existing gastrostomy tube was removed intact. Next, a new 18-French MIC balloon inflatable gastrostomy tube was inserted via the chronic gastrostomy tube track. The balloon was inflated and disc was cinched. Contrast was injected and several spot fluoroscopic images were obtained in various obliquities to confirm appropriate intraluminal positioning. A dressing was applied. The patient tolerated the procedure well without immediate postprocedural complication. IMPRESSION: Successful fluoroscopic guided replacement of a new 18-French gastrostomy tube. The new gastrostomy tube is ready for immediate use. Electronically Signed   By: Sandi Mariscal M.D.   On: 07/22/2019 10:33   Recent Labs    07/22/19 0739  WBC 6.9  HGB 12.2*  HCT 38.7*  PLT 448*   Recent Labs    07/22/19 0739  NA 140  K 3.5  CL 105  CO2 26  GLUCOSE 100*  BUN 9  CREATININE 0.75  CALCIUM 9.0    Physical Exam: BP (!) 152/90 (BP Location: Right Arm)   Pulse 83   Temp 97.8 F (36.6 C)   Resp 19   Ht 6' (1.829 m)   Wt 113 kg   SpO2  100%   BMI 33.79 kg/m  Constitutional: No distress . Vital signs reviewed. HEENT: EOMI, oral membranes moist Neck: supple Cardiovascular: RRR without murmur. No JVD    Respiratory/Chest: CTA Bilaterally without wheezes or rales. Normal effort    GI/Abdomen: BS +, non-tender, non-distended Ext: no clubbing, cyanosis, or edema Psych: delayed, flat Musc: No edema in extremities.  No tenderness in extremities. Neuro: Alert Facial paralysis Dysarthria Extensor tone with constant posturing movements of BLE and extension of LUE.>RUE. DTR's ?1+ UE and 3+ LE's. Sustained clonus in both LE. 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 Decreased LT in both arms and leg. Above shoulders/lower neck/clavicular area sensation seems improved Motor: limited by tone and participation.     Assessment/Plan: 1. 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  Physiatrist is providing close team supervision and 24 hour management of active medical problems listed below.  Physiatrist and rehab team continue to assess barriers to discharge/monitor patient progress toward functional and medical goals  Care Tool:  Bathing              Bathing assist Assist Level: 2 Helpers     Upper Body Dressing/Undressing Upper body dressing        Upper body assist Assist Level: Total Assistance - Patient < 25%    Lower Body Dressing/Undressing Lower body dressing            Lower body  assist Assist for lower body dressing: 2 Helpers     Toileting Toileting    Toileting assist Assist for toileting: 2 Helpers     Transfers Chair/bed transfer  Transfers assist  Chair/bed transfer activity did not occur: Safety/medical concerns (safety/medical b/c we introduced new technique??)  Chair/bed transfer assist level:  2 Helpers (slide board)     Locomotion Ambulation   Ambulation assist   Ambulation activity did not occur: Safety/medical concerns          Walk 10 feet activity   Assist  Walk 10 feet activity did not occur: Safety/medical concerns        Walk 50 feet activity   Assist Walk 50 feet with 2 turns activity did not occur: Safety/medical concerns         Walk 150 feet activity   Assist Walk 150 feet activity did not occur: Safety/medical concerns         Walk 10 feet on uneven surface  activity   Assist Walk 10 feet on uneven surfaces activity did not occur: Safety/medical concerns         Wheelchair     Assist Will patient use wheelchair at discharge?:  (likely will require dependent w/c transport)             Wheelchair 50 feet with 2 turns activity    Assist            Wheelchair 150 feet activity     Assist            Medical Problem List and Plan: 1. Diffuse weakness with hypertonicity and deficits with self-care, swallowing, cognition secondary to TBI with significant Psych history.  Continue CIR  Bilateral PRAFO qhs--discussed with team. [name] is potentially able to craft a serial cast/bi-valve splint to help with extensor tone RLE. Will discuss case with her when she's available. 2.  Antithrombotics: -DVT/anticoagulation:  Pharmaceutical: Lovenox             -antiplatelet therapy: N/a 3. Chronic LBP/Pain Management: Managed by tramadol prn.   Inconsistent control. 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  4. Mood/agitation/hx of bipolar disorder: Neuropsych and psychiatry to follow during his stay. LCSW to follow for evaluation and support             -antipsychotic agents:  seroquel  -continue cymbalta, zoloft  -added gabapentin as  above  -improving sleep patterns  -ativan BID and prn, no haldol!  -psych also following this patient 5. Neuropsych: This patient is not fully capable of making decisions on his own behalf. 6. Skin/Wound Care: Routine pressure relief measures.  7. Fluids/Electrolytes/Nutrition: Monitor I/Os.  11. Facial Fx/Maxillary fracture s/p repair with associated dysphagia: Jaws wired on 06/07--> to remain in place for 6 weeks. Soft diet/non-chew diet per Dr. Glenford Peers.              Dysphagia 1 thin diet  Patient pulled out PEG, new 18 Fr gastrostomy tube replaced by radiology 7/12  12. ABLA: Improving.    Hemoglobin 12.2 on 7/12  Continue to monitor 13. Prediabetes: Hyperglycemia due to tube feeds. Continue feeds at nights as intake remains variable.    Relatively controlled on 7/12  Monitor with increased mobility 14.  Severe muscle spasms/tone-dystonia? Posturing?:    -no improvement really after stopping haldol  -MRI of head and neck do not indicate an injury which would lead to clinical presentation we're seeing   Baclofen increased to 10 3 times daily on 7/11 without much effect  Gabapentin as above  7/13 Will begin trial of dantrium 25mg  bid initially  -serial-cast vs bi-valve 15.  Tachycardia/elevated blood pressure  Continue propanolol which was increased to 40 3 times daily on 7/11 16.  Transaminitis  LFTs elevated on 7/10, continue to monitor   Greater than 35 total minutes was spent in examination of patient, assessment of pertinent data,  formulation of a treatment plan, and in discussion with wife and treatment team.    LOS: 4 days A FACE TO FACE EVALUATION WAS PERFORMED  Meredith Staggers 07/23/2019, 10:53 AM

## 2019-07-23 NOTE — Plan of Care (Signed)
  Problem: Consults Goal: RH BRAIN INJURY PATIENT EDUCATION Description: Description: See Patient Education module for eduction specifics Outcome: Progressing Goal: Skin Care Protocol Initiated - if Braden Score 18 or less Description: If consults are not indicated, leave blank or document N/A Outcome: Progressing Goal: Nutrition Consult-if indicated Outcome: Progressing Goal: Diabetes Guidelines if Diabetic/Glucose > 140 Description: If diabetic or lab glucose is > 140 mg/dl - Initiate Diabetes/Hyperglycemia Guidelines & Document Interventions  Outcome: Progressing   Problem: RH BOWEL ELIMINATION Goal: RH STG MANAGE BOWEL WITH ASSISTANCE Description: STG Manage Bowel with mod I Assistance. Outcome: Progressing Goal: RH STG MANAGE BOWEL W/MEDICATION W/ASSISTANCE Description: STG Manage Bowel with Medication with min Assistance. Outcome: Progressing   Problem: RH BLADDER ELIMINATION Goal: RH STG MANAGE BLADDER WITH ASSISTANCE Description: STG Manage Bladder With mod I Assistance Outcome: Progressing Goal: RH STG MANAGE BLADDER WITH MEDICATION WITH ASSISTANCE Description: STG Manage Bladder With Medication With min Assistance. Outcome: Progressing   Problem: RH SKIN INTEGRITY Goal: RH STG SKIN FREE OF INFECTION/BREAKDOWN Description: Skin to remain free from infection and breakdown with min assist while on rehab. Outcome: Progressing Goal: RH STG MAINTAIN SKIN INTEGRITY WITH ASSISTANCE Description: STG Maintain Skin Integrity With min Assistance. Outcome: Progressing Goal: RH STG ABLE TO PERFORM INCISION/WOUND CARE W/ASSISTANCE Description: STG Able To Perform Incision/Wound Care With min Assistance. Outcome: Progressing   Problem: RH SAFETY Goal: RH STG ADHERE TO SAFETY PRECAUTIONS W/ASSISTANCE/DEVICE Description: STG Adhere to Safety Precautions With mod I Assistance and appropriate assistive Device. Outcome: Progressing   Problem: RH COGNITION-NURSING Goal: RH STG USES  MEMORY AIDS/STRATEGIES W/ASSIST TO PROBLEM SOLVE Description: STG Uses Memory Aids/Strategies With cues and reminder Assistance to Problem Solve. Outcome: Progressing Goal: RH STG ANTICIPATES NEEDS/CALLS FOR ASSIST W/ASSIST/CUES Description: STG Anticipates Needs/Calls for Assist With Cues and reminders. Outcome: Progressing   Problem: RH PAIN MANAGEMENT Goal: RH STG PAIN MANAGED AT OR BELOW PT'S PAIN GOAL Description: <3 on a 0-10 pain scale. Outcome: Progressing   Problem: RH KNOWLEDGE DEFICIT BRAIN INJURY Goal: RH STG INCREASE KNOWLEDGE OF SELF CARE AFTER BRAIN INJURY Description: Patient and caregiver will demonstrate knowledge of medication management, wound care management, dietary restrictions, and follow up care with the MD post discharge with min assist from CIR staff. Outcome: Progressing

## 2019-07-23 NOTE — Progress Notes (Signed)
Speech Language Pathology Daily Session Note  Patient Details  Name: Chase Caldwell MRN: [id number] Date of Birth: 26-May-1980  Today's Date: 07/23/2019 SLP Individual Time: 1305-1400 SLP Individual Time Calculation (min): 55 min  Short Term Goals: Week 1: SLP Short Term Goal 1 (Week 1): Pt will participate in continued assessment of cognitive lingustic skills. SLP Short Term Goal 2 (Week 1): Pt communicate wants/needs at the phrase level with min A verbal cues for 80% intelligibility. SLP Short Term Goal 3 (Week 1): Pt will produce specific speech sounds targeting alevolar, palatal and velar placement with 60% accuracy. SLP Short Term Goal 4 (Week 1): Pt will consume dys 1 textures and thin liquids with minimal s/s aspriation and mod A verbal cues for use of swallow strategies. SLP Short Term Goal 5 (Week 1): Pt will demonstrate recall of novel and daily information with min A verbal cues.  Skilled Therapeutic Interventions: Skilled treatment session focused on cognitive and swallowing goals. SLP facilitated session by providing Min verbal cues for use of small sips of thin liquids with cues to "breath" after completion of the swallow. Patient also attempted to self-feed Dys. 1 textures with hand over hand assist needed for placement and Mod verbal cues for removal of spoon. Patient with mildly prolonged AP transit but without overt s/s of aspiration. Patient recalled events from previous therapy sessions with extra time and was overall 75% intelligible at the phrase level with Min verbal cues needed for a slow rate. Patient left upright in the wheelchair at the RN station with belt in place. Continue with current plan of care.      Pain Pain Assessment Pain Scale: 0-10 Pain Score: 8  (spasms appear better) Pain Type: Acute pain Pain Location: Leg Pain Orientation: Right Pain Descriptors / Indicators: Aching Pain Frequency: Constant Pain Onset: On-going Patients Stated Pain Goal: 3 Pain  Intervention(s): Medication (See eMAR)  Therapy/Group: Individual Therapy  Jadier Rockers 07/23/2019, 3:38 PM

## 2019-07-23 NOTE — Progress Notes (Signed)
Physical Therapy Session Note  Patient Details  Name: Chase Caldwell MRN: [id number] Date of Birth: Aug 21, 1980  Today's Date: 07/23/2019 PT Individual Time: 1400-1430 PT Individual Time Calculation (min): 30 min   Short Term Goals: Week 1:  PT Short Term Goal 1 (Week 1): Pt will perform supine>sit with max assist of 1 person PT Short Term Goal 2 (Week 1): Pt will perform sit>supine with +2 max assist PT Short Term Goal 3 (Week 1): Pt will perform bed<>chair transfers with +2 max assist PT Short Term Goal 4 (Week 1): Pt will perform sit<>stands with +2 max assist PT Short Term Goal 5 (Week 1): Pt will maintain static sitting balance EOM for at least 1 minute with supervision  Skilled Therapeutic Interventions/Progress Updates:     Patient in [name] at nurses station upon PT arrival. Patient alert and agreeable to PT session. Patient reported 8/10 L UE and R LE pain with muscle spasms and increased tone during session, RN made aware and provided medication during session. PT provided repositioning, rest breaks, and distraction as pain interventions throughout session.   Educated patient on pain management strategies including calling for pain medication, diaphragmatic breathing, manual pressure to L UE for reduced tone, PT provided this to reduce extensor tone throughout session, progressed to patient performing self pressure with R UE. Also educated on use of reciprocal inhibition with activation of the antagonist muscle, performed dorsiflexor activation to reduce PF tone of R ankle and neck extension to head rest to reduce trunk and L UE flexor tone. Patient alternated between L UE flexor versus extensor tone. Flexor tone only present with trunk flexion, extensor tone intermittent throughout session. Performed DF and eversion stretch to R ankle, able to reach neutral position for ankle eversion with improved flexibility today, however, unable to improved PF with prolonged stretch, 3x1 min. Patient  became emotional x2 during session due to pain, provided emotional support and soft tissue mobilization to reduce spasticity. Offered to return patient to bed for change of positioning and pressure relief, patient declined, reported that he is much more comfortable in the chair than in the bed. Educated Engineer, civil (consulting) and patient on use of Maxi move to return patient to bed and use of tilt feature to provide pressure relief in sitting. Adjusted patient's position in the [name] to comfort using tilt feature and a pillow under his R foot.    Patient in [place] w/c at the nurses station at end of session with breaks locked and all needs within reach.   Therapy Documentation Precautions:  Precautions Precautions: Fall, Other (comment) Precaution Comments: PEG, significant hypertonia, right ankle PF contracture Required Braces or Orthoses: Other Brace Other Brace: abdominal binder (to keep PEG safe) Restrictions Weight Bearing Restrictions: No    Therapy/Group: Individual Therapy  Adarius Tigges L Cieara Stierwalt PT, DPT  07/23/2019, 5:22 PM
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
## 2019-07-23 NOTE — Progress Notes (Signed)
Occupational Therapy Session Note  Patient Details  Name: Chase Caldwell MRN: [id number] Date of Birth: 1980/12/20  Today's Date: 07/23/2019 OT Individual Time: 0905-1000 OT Individual Time Calculation (min): 55 min    Short Term Goals: Week 1:  OT Short Term Goal 1 (Week 1): Pt will sitEOB with mod A in prep for functional ADL task OT Short Term Goal 2 (Week 1): Pt will self feed with AE with max A OT Short Term Goal 3 (Week 1): Pt will tolerate sitting upright out of bed for ~3 hrs OT Short Term Goal 4 (Week 1): Pt will roll to right and left with max A of one caregiver  Skilled Therapeutic Interventions/Progress Updates:    1:1 Pt received in bed when arrived. Pt reported having a bowel movement and needed to be cleaned up. Focus on bed mobility with rolling with initiating with head with max A. Pt required total A for hygiene and donning new brief and total A +2 for LB clothing management. Pt able to bridge once Les placed in correct position for caregiver to pull up pants. Pt able to follow all directions cognitively - continues to be limited due to spasms and significant tone. Pt required max A +2 to come into sitting EOB. Initially with significant extensor tone in trunk (leaning backwards) but with cue to bring forearms to thighs pt able to come to midline and maintain sitting position with min A with mod continuous cues. Pt with difficulty with grading movement and benefits from multimodal cues. Performed 3 sit to stands with max A +2 with continual focus on postural control at midline/ coming to neural (and out of extensor tone). Ankle did not roll in this session however pt was using frame of bed to push against which contributed to ankle stability.  Slide board transfer into tilt in space w/c with max A+2 with focus on controlling movement and maintaining forward weight shift. Pt wanted to sit at nursing station for company.   Therapy Documentation Precautions:   Precautions Precautions: Fall, Other (comment) Precaution Comments: PEG, significant hypertonia, right ankle PF contracture Required Braces or Orthoses: Other Brace Other Brace: abdominal binder (to keep PEG safe) Restrictions Weight Bearing Restrictions: No Pain: Some discomfort from spasms in Ue; addressed with positioning and pt found relieft  Therapy/Group: Individual Therapy  Willeen Cass [facility] 07/23/2019, 11:42 AM

## 2019-07-24 ENCOUNTER — Inpatient Hospital Stay (HOSPITAL_COMMUNITY): Payer: Medicaid Other

## 2019-07-24 ENCOUNTER — Inpatient Hospital Stay (HOSPITAL_COMMUNITY): Payer: Medicaid Other | Admitting: Speech Pathology

## 2019-07-24 ENCOUNTER — Inpatient Hospital Stay (HOSPITAL_COMMUNITY): Payer: Medicaid Other | Admitting: Occupational Therapy

## 2019-07-24 LAB — GLUCOSE, CAPILLARY
Glucose-Capillary: 109 mg/dL — ABNORMAL HIGH (ref 70–99)
Glucose-Capillary: 112 mg/dL — ABNORMAL HIGH (ref 70–99)
Glucose-Capillary: 114 mg/dL — ABNORMAL HIGH (ref 70–99)
Glucose-Capillary: 119 mg/dL — ABNORMAL HIGH (ref 70–99)
Glucose-Capillary: 88 mg/dL (ref 70–99)
Glucose-Capillary: 93 mg/dL (ref 70–99)

## 2019-07-24 MED ORDER — DIPHENHYDRAMINE HCL 12.5 MG/5ML PO ELIX
25.0000 mg | ORAL_SOLUTION | Freq: Once | ORAL | Status: AC
  Administered 2019-07-24: 25 mg via ORAL

## 2019-07-24 MED ORDER — DANTROLENE SODIUM 25 MG PO CAPS
25.0000 mg | ORAL_CAPSULE | Freq: Three times a day (TID) | ORAL | Status: DC
  Filled 2019-07-24: qty 1

## 2019-07-24 MED ORDER — DANTROLENE SODIUM 25 MG PO CAPS
25.0000 mg | ORAL_CAPSULE | Freq: Three times a day (TID) | ORAL | Status: DC
  Administered 2019-07-24 – 2019-07-26 (×6): 25 mg
  Filled 2019-07-24 (×8): qty 1

## 2019-07-24 MED ORDER — EPINEPHRINE 0.3 MG/0.3ML IJ SOAJ: 0.3000 mg | Freq: Once | INTRAMUSCULAR | Status: DC | PRN

## 2019-07-24 NOTE — Progress Notes (Signed)
Speech Language Pathology Daily Session Note  Patient Details  Name: Chase Caldwell MRN: [id number] Date of Birth: 15-May-1980  Today's Date: 07/24/2019 SLP Individual Time: 1100-1125 SLP Individual Time Calculation (min): 25 min  Short Term Goals: Week 1: SLP Short Term Goal 1 (Week 1): Pt will participate in continued assessment of cognitive lingustic skills. SLP Short Term Goal 2 (Week 1): Pt communicate wants/needs at the phrase level with min A verbal cues for 80% intelligibility. SLP Short Term Goal 3 (Week 1): Pt will produce specific speech sounds targeting alevolar, palatal and velar placement with 60% accuracy. SLP Short Term Goal 4 (Week 1): Pt will consume dys 1 textures and thin liquids with minimal s/s aspriation and mod A verbal cues for use of swallow strategies. SLP Short Term Goal 5 (Week 1): Pt will demonstrate recall of novel and daily information with min A verbal cues.  Skilled Therapeutic Interventions: Skilled treatment session focused on speech goals. SLP facilitated session by providing Min A verbal cues for use of speech intelligibility strategies to achieve ~90% intelligibility at the word level with minimal pairs. Intelligibility decreased to ~75% at the phrase and sentence level with Min verbal cues needed for over-articulation and a slow rate. Patient with ongoing pain due to tone, RN aware. Patient also reported pain in lips, PA aware. Patient left upright in bed with alarm on and all needs within reach. Continue with current plan of care.       Pain Pain Assessment Pain Scale: 0-10 Pain Score: 8  Pain Descriptors / Indicators: Spasm Pain Frequency: Constant  Therapy/Group: Individual Therapy  Jama Krichbaum 07/24/2019, 2:24 PM

## 2019-07-24 NOTE — Progress Notes (Signed)
Occupational Therapy Session Note  Patient Details  Name: Chase Caldwell MRN: [id number] Date of Birth: 03-23-1980  Today's Date: 07/24/2019 OT Individual Time: 1445-1530 OT Individual Time Calculation (min): 45 min    Short Term Goals: Week 1:  OT Short Term Goal 1 (Week 1): Pt will sitEOB with mod A in prep for functional ADL task OT Short Term Goal 2 (Week 1): Pt will self feed with AE with max A OT Short Term Goal 3 (Week 1): Pt will tolerate sitting upright out of bed for ~3 hrs OT Short Term Goal 4 (Week 1): Pt will roll to right and left with max A of one caregiver  Skilled Therapeutic Interventions/Progress Updates:    1:1. Pt received in bed reporting wet brief and requesting to sit at RN station. Pt requries total A +2 to roll in B directions using chick pad to facilitate sliding hips in bed for improved rolling to changing brief and cleansing peri area. Pt with significant extension tone present in LUE throughout session. Pt completes supine>sitting total A +2 and slide board transfer with MAX A +2 EOB>TIS with improved activation to clear buttocks from sliding board and MAX VC for head hips relationship. Pt reporting decrease in pain sitting in TIS and OT straps in hips and thighs (adducted) and tilts w/c back to improve safety with sitting at RN station.   Therapy Documentation Precautions:  Precautions Precautions: Fall, Other (comment) Precaution Comments: PEG, significant hypertonia, right ankle PF contracture Required Braces or Orthoses: Other Brace Other Brace: abdominal binder (to keep PEG safe) Restrictions Weight Bearing Restrictions: No General:   Vital Signs: Therapy Vitals Temp: 98.4 F (36.9 C) Pulse Rate: 75 Resp: 17 BP: (!) 146/76 Patient Position (if appropriate): Sitting Oxygen Therapy SpO2: 98 % O2 Device: Room Air Pain: Pain Assessment Pain Scale: 0-10 Pain Score: 8  Pain Descriptors / Indicators: Spasm Pain Frequency:  Constant ADL: ADL Eating: Dependent Where Assessed-Eating: Bed level Grooming: Dependent Where Assessed-Grooming: Wheelchair Upper Body Bathing: Dependent Where Assessed-Upper Body Bathing: Wheelchair Lower Body Bathing: Dependent Where Assessed-Lower Body Bathing: Edge of bed Upper Body Dressing: Dependent Where Assessed-Upper Body Dressing: Wheelchair Lower Body Dressing: Dependent Toileting: Dependent Toilet Transfer: Not assessed Social research officer, government: Not assessed Vision   Perception    Praxis   Exercises:   Other Treatments:     Therapy/Group: Individual Therapy  Tonny Branch 07/24/2019, 3:28 PM

## 2019-07-24 NOTE — Progress Notes (Signed)
Reesa Chew PA in with pt for c/o numbness. Pt in bed RR normal no evidence of angioedema or rash. Pt is able to speak at baseline. Benadryl given as ordered. Pt remains in bed resting.

## 2019-07-24 NOTE — Progress Notes (Signed)
[facility] PHYSICAL MEDICINE & REHABILITATION PROGRESS NOTE  Subjective/Complaints: Had some spasms last night but overall sees some improvement.   ROS: Patient denies fever, rash, sore throat, blurred vision, nausea, vomiting, diarrhea, cough, shortness of breath or chest pain headache, or mood change.     Objective: Vital Signs: Blood pressure 139/81, pulse 73, temperature 98.2 F (36.8 C), resp. rate 16, height 6' (1.829 m), weight 115 kg, SpO2 97 %. IR Replc Gastro/Colonic Tube Percut W/Fluoro  Result Date: 07/22/2019 INDICATION: Malfunctioning gastrostomy tube. Please perform fluoroscopic guided exchange. EXAM: FLUOROSCOPIC GUIDED REPLACEMENT OF GASTROSTOMY TUB COMPARISON:  Fluoroscopic guided gastrostomy tube exchange-07/11/2019. MEDICATIONS: None. CONTRAST:  10 cc Omnipaque 300 - administered into the gastric lumen FLUOROSCOPY TIME:  12 seconds (7 mGy) COMPLICATIONS: None immediate. PROCEDURE: The patient was positioned supine on fluoroscopy table. The existing gastrostomy tube was removed intact. Next, a new 18-French MIC balloon inflatable gastrostomy tube was inserted via the chronic gastrostomy tube track. The balloon was inflated and disc was cinched. Contrast was injected and several spot fluoroscopic images were obtained in various obliquities to confirm appropriate intraluminal positioning. A dressing was applied. The patient tolerated the procedure well without immediate postprocedural complication. IMPRESSION: Successful fluoroscopic guided replacement of a new 18-French gastrostomy tube. The new gastrostomy tube is ready for immediate use. Electronically Signed   By: Sandi Mariscal M.D.   On: 07/22/2019 10:33   Recent Labs    07/22/19 0739  WBC 6.9  HGB 12.2*  HCT 38.7*  PLT 448*   Recent Labs    07/22/19 0739  NA 140  K 3.5  CL 105  CO2 26  GLUCOSE 100*  BUN 9  CREATININE 0.75  CALCIUM 9.0    Physical Exam: BP 139/81 (BP Location: Right Arm)   Pulse 73   Temp  98.2 F (36.8 C)   Resp 16   Ht 6' (1.829 m)   Wt 115 kg   SpO2 97%   BMI 34.38 kg/m  Constitutional: No distress . Vital signs reviewed. HEENT: EOMI, oral membranes moist Neck: supple Cardiovascular: RRR without murmur. No JVD    Respiratory/Chest: CTA Bilaterally without wheezes or rales. Normal effort    GI/Abdomen: BS +, non-tender, non-distended Ext: no clubbing, cyanosis, or edema Psych: pleasant and cooperative Musc: No edema in extremities.  No tenderness in extremities. Neuro: Alert Facial paralysis Dysarthria, improve phonation, sees general shapes Extensor tone with constant posturing movements of BLE and extension of LUE.>RUE. DTR's ?1+ UE and 3+ LE's. Sustained clonus in both LE. right heel cord contracture of 30-40 degrees with leg in full extension, 10-15 degrees left ankle--ROM about the same. Decreased LT in both arms and leg. Above shoulders/lower neck/clavicular area sensation seems improved Motor: moves all 4's but limited by tone, left more than right.     Assessment/Plan: 1. 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  Physiatrist is providing close team supervision and 24 hour management of active medical problems listed below.  Physiatrist and rehab team continue to assess barriers to discharge/monitor patient progress toward functional and medical goals  Care Tool:  Bathing              Bathing assist Assist Level: 2 Helpers     Upper Body Dressing/Undressing Upper body dressing        Upper body assist Assist Level: Total Assistance - Patient < 25%    Lower  Body Dressing/Undressing Lower body dressing            Lower body assist Assist for lower body dressing: 2 Helpers     Toileting Toileting    Toileting assist Assist for toileting: 2 Helpers     Transfers Chair/bed transfer  Transfers  assist  Chair/bed transfer activity did not occur: Safety/medical concerns (safety/medical b/c we introduced new technique??)  Chair/bed transfer assist level: 2 Helpers     Locomotion Ambulation   Ambulation assist   Ambulation activity did not occur: Safety/medical concerns          Walk 10 feet activity   Assist  Walk 10 feet activity did not occur: Safety/medical concerns        Walk 50 feet activity   Assist Walk 50 feet with 2 turns activity did not occur: Safety/medical concerns         Walk 150 feet activity   Assist Walk 150 feet activity did not occur: Safety/medical concerns         Walk 10 feet on uneven surface  activity   Assist Walk 10 feet on uneven surfaces activity did not occur: Safety/medical concerns         Wheelchair     Assist Will patient use wheelchair at discharge?:  (likely will require dependent w/c transport)      Wheelchair assist level: Dependent - Patient 0%      Wheelchair 50 feet with 2 turns activity    Assist        Assist Level: Dependent - Patient 0%   Wheelchair 150 feet activity     Assist     Assist Level: Dependent - Patient 0%      Medical Problem List and Plan: 1. Diffuse weakness with hypertonicity and deficits with self-care, swallowing, cognition secondary to TBI with significant Psych history.  Continue CIR  Bilateral PRAFO qhs--discussed with team. [name] OT, to see today to craft a serial cast/bi-valve splint to help with extensor tone RLE.   2.  Antithrombotics: -DVT/anticoagulation:  Pharmaceutical: Lovenox             -antiplatelet therapy: N/a 3. Chronic LBP/Pain Management: Managed by tramadol prn.   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  4. Mood/agitation/hx of bipolar disorder:  Neuropsych and psychiatry to follow during his stay. LCSW to follow for evaluation and support             -antipsychotic agents:  seroquel  -continue cymbalta, zoloft  -added gabapentin as above  -improving sleep patterns  -ativan BID and prn, no haldol!  -psych also following this patient 5. Neuropsych: This patient is not fully capable of making decisions on his own behalf. 6. Skin/Wound Care: Routine pressure relief measures.  7. Fluids/Electrolytes/Nutrition: Monitor I/Os.  11. Facial Fx/Maxillary fracture s/p repair with associated dysphagia: Jaws wired on 06/07--> to remain in place for 6 weeks. Soft diet/non-chew diet per Dr. Glenford Peers.              Dysphagia 1 thin diet  Patient pulled out PEG, new 18 Fr gastrostomy tube replaced by radiology 7/12  12. ABLA: Improving.    Hemoglobin 12.2 on 7/12  Continue to monitor 13. Prediabetes: Hyperglycemia due to tube feeds. Continue feeds at nights as intake  remains variable.    Relatively controlled on 7/12             Monitor with increased mobility 14.  Severe muscle spasms/tone-dystonia? Posturing?:    -no improvement really after stopping haldol  -MRI of head and neck do not indicate an injury which would lead to clinical presentation we're seeing. Consider f/u MRI next week   Baclofen increased to 10 3 times daily on 7/11 without much effect  Gabapentin as above  7/13 dantrium initiated  7/14, increase dantrium to TID--observe for neurosedation   --  bi-valve cast today 15.  Tachycardia/elevated blood pressure  Continue propanolol which was increased to 40 3 times daily on 7/11 16.  Transaminitis  LFTs elevated on 7/10, continue to monitor      LOS: 5 days A FACE TO FACE EVALUATION WAS PERFORMED  Meredith Staggers 07/24/2019, 8:32 AM

## 2019-07-24 NOTE — Progress Notes (Addendum)
Physical Therapy Session Note  Patient Details  Name: Chase Caldwell MRN: [id number] Date of Birth: February 23, 1980  Today's Date: 07/24/2019 PT Individual Time: 0810-0907 PT Individual Time Calculation (min): 57 min   Short Term Goals: Week 1:  PT Short Term Goal 1 (Week 1): Pt will perform supine>sit with max assist of 1 person PT Short Term Goal 2 (Week 1): Pt will perform sit>supine with +2 max assist PT Short Term Goal 3 (Week 1): Pt will perform bed<>chair transfers with +2 max assist PT Short Term Goal 4 (Week 1): Pt will perform sit<>stands with +2 max assist PT Short Term Goal 5 (Week 1): Pt will maintain static sitting balance EOM for at least 1 minute with supervision  Skilled Therapeutic Interventions/Progress Updates:     Patient in bed upon PT arrival. Patient alert and agreeable to PT session. Patient reported 8/10 "full body" pain due to muscle spasms/posturing/increased tone, worst in L UE/wrist and R LE calf during session, RN pre-medicated patient prior to session. PT provided repositioning, rest breaks, distraction, massage, and manual pressure for muscle inhibition for reduced tone as pain interventions throughout session.   Pt reports c/o tongue being swollen and numb this morning. RN and PA made aware and assessed patient during session. Is swollen to this clinician compared to yesterday.   Patient was incontinent of bladder prior to session.    Therapeutic Activity: Bed Mobility: Patient performed rolling R/L with mod A and cues and manual facilitation for bending opposite LE and reaching with opposite UE to roll to doff/don incontinence brief and perform peri-care with total A. PT threaded LEs through shorts and pulled then up with total A as patient performed bridging x2 with stabilization of B LE by a second person. Required rest breaks between bridging due to increased L UE extensor tone after both trials and causing patient to cry out in pain on second trial. Provided  interventions listed above for pain/tone management.  He performed R side-lying to sit with mod-max A of 1 person. Provided verbal cues and manual facilitation for bringing LEs off the bed and setting bottom elbow to push up to elbow then hand to come to sitting. Patient sat EOB with min A-CGA as PT donned socks and shoes with total A. Asked patient to perform DF when donning shoes to improve positioning of his foot, set off extensor tone in R LE due to decreased motor control. Required increased time to allow tone to resolve before donning his shoe.  Transfers: Patient performed a slide board transfer bed>[name] with max A of 1 person and min A of a second and total A for board placement. Provided cues for hand placement in lap due to increased tone, board placement, timing and sequencing for pushing through LEs to lift hips, and head-hips relationship for proper technique and decreased assist with transfers. PT blocked patient's knees to maintain knee flexion and adjusted his feet with total A throughout. Also assisted patient with reaching over his shoulder to promote trunk flexion to avoid extensor tone and maintain patient's shoulder at therapist's hip.  Wheelchair Mobility:  Wheelchair positioning: Adjusted leg rests with use of exercise band and secured leg rests with [name] for improved patient positioning in [name]. Applied gait belt around patient's waste to prevent extensor tone and another gait belt around his thighs to prevent abduction at hips with flexor tone. Tilted patient at 35 degrees to promote cervical extension and reduce muscle spasms with flexor tone. Transported patient dependently in  [name] to nurses station for supervision between therapy sessions.  Patient in [place] w/c at end of session with breaks locked and all needs within reach.    Therapy Documentation Precautions:  Precautions Precautions: Fall, Other (comment) Precaution Comments: PEG, significant hypertonia, right  ankle PF contracture Required Braces or Orthoses: Other Brace Other Brace: abdominal binder (to keep PEG safe) Restrictions Weight Bearing Restrictions: No   Therapy/Group: Individual Therapy  Hye Trawick L Aleyna Cueva PT, DPT  07/24/2019, 6:41 PM

## 2019-07-24 NOTE — Progress Notes (Signed)
Occupational Therapy Session Note  Patient Details  Name: Chase Caldwell MRN: [id number] Date of Birth: [birth year]  Today's Date: 07/24/2019 OT Individual Time: 1015-1100 OT Individual Time Calculation (min): 45 min    Short Term Goals: Week 1:  OT Short Term Goal 1 (Week 1): Pt will sitEOB with mod A in prep for functional ADL task OT Short Term Goal 2 (Week 1): Pt will self feed with AE with max A OT Short Term Goal 3 (Week 1): Pt will tolerate sitting upright out of bed for ~3 hrs OT Short Term Goal 4 (Week 1): Pt will roll to right and left with max A of one caregiver Week 2:     Skilled Therapeutic Interventions/Progress Updates:    1:1 Pt received in the w/c. Pt participated in bathing at shower level to further assess bathing in normal environment. Pt transferred from tilt in space w/c into roll in shower chair via maxi move. Pt attempted to bathe UB but required total A / hand over A to wash upper chest. Pt was however able to maintain sitting balance without back support with min guard - min A. Pt's tone and spasms relaxed in the warm water. Pt lifted with maxi move back to the bed. Brief donned with max A for rolling to the right and left. Pt able to lift legs for pants to be threaded and the pt able to bridge once LEs in place for OT to pull pants over hips. Pt left in bed to rest.  Pt reports c/o tongue being swollen - beginning at 6 am this morning. RN aware and reported PA was aware. Is swollen to this clinician compared to yesterday.   Therapy Documentation Precautions:  Precautions Precautions: Fall, Other (comment) Precaution Comments: PEG, significant hypertonia, right ankle PF contracture Required Braces or Orthoses: Other Brace Other Brace: abdominal binder (to keep PEG safe) Restrictions Weight Bearing Restrictions: No General:   Vital Signs: Therapy Vitals Temp: 98.4 F (36.9 C) Pulse Rate: 75 Resp: 17 BP: (!) 146/76 Patient Position (if appropriate):  Sitting Oxygen Therapy SpO2: 98 % O2 Device: Room Air Pain: Off and on pain in session - offered breaks when needed and   Therapy/Group: Individual Therapy  Willeen Cass [facility] 07/24/2019, 3:03 PM

## 2019-07-24 NOTE — Progress Notes (Signed)
Patient ID: Chase Caldwell, male   DOB: 1980-12-28, 39 y.o.   MRN: [id number]  SW called pt wife Chase Caldwell [phone]) to provide updates from team conference,and inform ELOS is 3-4 weeks. SW indicated will continue to provide updates.   Loralee Pacas, MSW, [place] Office: [phone] Cell: [phone] Fax: [phone]

## 2019-07-25 ENCOUNTER — Inpatient Hospital Stay (HOSPITAL_COMMUNITY): Payer: Medicaid Other

## 2019-07-25 ENCOUNTER — Inpatient Hospital Stay (HOSPITAL_COMMUNITY): Payer: Medicaid Other | Admitting: Speech Pathology

## 2019-07-25 LAB — GLUCOSE, CAPILLARY
Glucose-Capillary: 101 mg/dL — ABNORMAL HIGH (ref 70–99)
Glucose-Capillary: 103 mg/dL — ABNORMAL HIGH (ref 70–99)
Glucose-Capillary: 107 mg/dL — ABNORMAL HIGH (ref 70–99)
Glucose-Capillary: 111 mg/dL — ABNORMAL HIGH (ref 70–99)
Glucose-Capillary: 116 mg/dL — ABNORMAL HIGH (ref 70–99)
Glucose-Capillary: 116 mg/dL — ABNORMAL HIGH (ref 70–99)

## 2019-07-25 NOTE — Progress Notes (Signed)
Occupational Therapy Note  Patient Details  Name: Chase Caldwell MRN: [id number] Date of Birth: Jun 02, 1980    Pt seen for passive stretch of [name] ankle and hip in preparation for bivalve serial casting.  Max dorsi flexion achieved was -56*.  Pt was then casted in ~5-10* less than this.  Ortho techs assisted.  Pt tolerated procedure well.  PT present and assisted with positioning.   Will plan to leave cast in place for 1.5 hours, then bivalve it.  [name]., OTR/L Acute Rehabilitation Services Pager [phone] Office [phone]    [name] 07/25/2019, 5:59 PM
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
## 2019-07-25 NOTE — Progress Notes (Signed)
Occupational Therapy Session Note  Patient Details  Name: Chase Caldwell MRN: [id number] Date of Birth: December 15, 1980  Today's Date: 07/25/2019 OT Individual Time: 0815-0900 and 1000-1023 OT Individual Time Calculation (min): 45 min and 23 min    Short Term Goals: Week 1:  OT Short Term Goal 1 (Week 1): Pt will sitEOB with mod A in prep for functional ADL task OT Short Term Goal 2 (Week 1): Pt will self feed with AE with max A OT Short Term Goal 3 (Week 1): Pt will tolerate sitting upright out of bed for ~3 hrs OT Short Term Goal 4 (Week 1): Pt will roll to right and left with max A of one caregiver  Skilled Therapeutic Interventions/Progress Updates:    Session 1 45 min: Pt received in bed with no pants. Bridging of hips with increased time for pt to A with positioning feet and +2 stabilizing feet from sliding on bed for brief and pants donning. Pt SPT with total A+2 OOB to TIS with VC for head hips relationship. Pt self feeds with total A to scoop and min A to guide to mouth with red foam handle applied to spoon to improve grip as well as elbow resting on high low table for decreasing degrees of freedom. Pt able to complete ~12 bites with A before reporting max fatigue. While self feeding, LUE outstretched on tabletop with heat pack applied to elbow/forearm. Visibly decreased spasms and tone with hand tolerateing resting outstretched and wrist maintained in neutral. Pt repositioned similarly at RN station with LUE on tabletop and heat maintained on LUE. Skin in tact when heat pack recovered later. Exited session with pt seated in TIS at RN station with hips stabilized with gait belt in w/c.  Session 2 23 min: Pt received in TIS with wife present agreeable to OT but stating, "my ass hurts." relief when repositioned back in bed at end of session. Pt completes lateral leaning into BUE seated in TIS with flat hand facilitation on chairs on B sides of w/c. Pt able to voluntarily bend elbow towards chair  and activate/WB through hand to press up to midline sitting at [place]. Pt falling asleep after ~10 min activity and returned to bed with total A +2 SBT TIS>EOB with max VC for head hips relationship. OT applies resting hand splints and pt snoring audible once head hits the pillow. Exited session with pt seated in bed, exit alarm on and call light in reach  Therapy Documentation Precautions:  Precautions Precautions: Fall, Other (comment) Precaution Comments: PEG, significant hypertonia, right ankle PF contracture Required Braces or Orthoses: Other Brace Other Brace: abdominal binder (to keep PEG safe) Restrictions Weight Bearing Restrictions: No General:   Vital Signs:   Pain: Pain Assessment Pain Scale: 0-10 Pain Score: Asleep Pain Type: Acute pain Pain Location: Wrist Pain Orientation: Right;Left ADL: ADL Eating: Dependent Where Assessed-Eating: Bed level Grooming: Dependent Where Assessed-Grooming: Wheelchair Upper Body Bathing: Dependent Where Assessed-Upper Body Bathing: Wheelchair Lower Body Bathing: Dependent Where Assessed-Lower Body Bathing: Edge of bed Upper Body Dressing: Dependent Where Assessed-Upper Body Dressing: Wheelchair Lower Body Dressing: Dependent Toileting: Dependent Toilet Transfer: Not assessed Social research officer, government: Not assessed Vision   Perception    Praxis   Exercises:   Other Treatments:     Therapy/Group: Individual Therapy  Tonny Branch 07/25/2019, 10:24 AM

## 2019-07-25 NOTE — Progress Notes (Signed)
[facility] PHYSICAL MEDICINE & REHABILITATION PROGRESS NOTE  Subjective/Complaints: Reasonable night. A little restless this morning.    ROS: Limited due to cognitive/behavioral    Objective: Vital Signs: Blood pressure 126/76, pulse 82, temperature 98.3 F (36.8 C), resp. rate 17, height 6' (1.829 m), weight 115 kg, SpO2 97 %. No results found. No results for input(s): WBC, HGB, HCT, PLT in the last 72 hours. No results for input(s): NA, K, CL, CO2, GLUCOSE, BUN, CREATININE, CALCIUM in the last 72 hours.  Physical Exam: BP 126/76 (BP Location: Right Arm)   Pulse 82   Temp 98.3 F (36.8 C)   Resp 17   Ht 6' (1.829 m)   Wt 115 kg   SpO2 97%   BMI 34.38 kg/m  Constitutional: No distress . Vital signs reviewed. HEENT: EOMI, oral membranes moist Neck: supple Cardiovascular: RRR without murmur. No JVD    Respiratory/Chest: CTA Bilaterally without wheezes or rales. Normal effort    GI/Abdomen: BS +, non-tender, non-distended Ext: no clubbing, cyanosis, or edema Psych: a little more restless, confused Musc: No edema in extremities.  No tenderness in extremities. Neuro: a little subdued today Facial paralysis Dysarthria,  Extensor tone L>R. DTR's ?1+ UE and 3+ LE's. Sustained clonus in both LE. right heel cord contracture of 30+ degrees with leg in full extension, 10+ degrees left ankle--ROM about the same. Decreased LT in both arms and leg. Above shoulders/lower neck/clavicular area sensation seems improved Motor: moves all 4's   Assessment/Plan: 1. 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  Physiatrist is providing close team supervision and 24 hour management of active medical problems listed below.  Physiatrist and rehab team continue to assess barriers to discharge/monitor patient progress toward functional and medical goals  Care  Tool:  Bathing        Body parts bathed by helper: Right arm, Left arm, Chest, Abdomen, Front perineal area, Buttocks, Right upper leg, Left upper leg, Right lower leg, Left lower leg, Face     Bathing assist Assist Level: 2 Helpers     Upper Body Dressing/Undressing Upper body dressing        Upper body assist Assist Level: Total Assistance - Patient < 25%    Lower Body Dressing/Undressing Lower body dressing      What is the patient wearing?: Incontinence brief, Underwear/pull up     Lower body assist Assist for lower body dressing: Total Assistance - Patient < 25%     Toileting Toileting    Toileting assist Assist for toileting: 2 Helpers     Transfers Chair/bed transfer  Transfers assist  Chair/bed transfer activity did not occur: Safety/medical concerns (safety/medical b/c we introduced new technique??)  Chair/bed transfer assist level: 2 Helpers     Locomotion Ambulation   Ambulation assist   Ambulation activity did not occur: Safety/medical concerns          Walk 10 feet activity   Assist  Walk 10 feet activity did not occur: Safety/medical concerns        Walk 50 feet activity   Assist Walk 50 feet with 2 turns activity did not occur: Safety/medical concerns         Walk 150 feet activity   Assist Walk 150 feet activity did not occur: Safety/medical concerns         Walk 10 feet on uneven surface  activity   Assist Walk  10 feet on uneven surfaces activity did not occur: Safety/medical concerns         Wheelchair     Assist Will patient use wheelchair at discharge?:  (likely will require dependent w/c transport)      Wheelchair assist level: Dependent - Patient 0%      Wheelchair 50 feet with 2 turns activity    Assist        Assist Level: Dependent - Patient 0%   Wheelchair 150 feet activity     Assist     Assist Level: Dependent - Patient 0%      Medical Problem List and Plan: 1.  Diffuse weakness with hypertonicity and deficits with self-care, swallowing, cognition secondary to TBI with significant Psych history.  Continue CIR  Bilateral PRAFO qhs--discussed with team. [place]ard Artis Conarpe OT, to see for  bi-valve splint to help with extensor tone RLE.   2.  Antithrombotics: -DVT/anticoagulation:  Pharmaceutical: Lovenox             -antiplatelet therapy: N/a 3. Chronic LBP/Pain Management: Managed by tramadol prn.     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  4. Mood/agitation/hx of bipolar disorder: Neuropsych and psychiatry to follow during his stay. LCSW to follow for evaluation and support             -antipsychotic agents:  seroquel  -continue cymbalta, zoloft  -added gabapentin as above  -improving sleep patterns  -ativan BID and prn, no haldol!  -psych also following this patient 5. Neuropsych: This patient is not fully capable of making decisions on his own behalf. 6. Skin/Wound Care: Routine pressure relief measures.  7. Fluids/Electrolytes/Nutrition: Monitor I/Os.  11. Facial Fx/Maxillary fracture s/p repair with associated dysphagia: Jaws wired on 06/07--> to remain in place for 6 weeks. Soft diet/non-chew diet per Dr. Glenford Peers.              Dysphagia 1 thin diet  Patient pulled out PEG, new 18 Fr gastrostomy tube replaced by radiology 7/12  12. ABLA: Improving.    Hemoglobin 12.2 on 7/12  Continue to monitor 13. Prediabetes: Hyperglycemia due to tube feeds. Continue feeds at nights as intake remains variable.    Relatively controlled on 7/12             Monitor with increased mobility 14.  Severe muscle spasms/tone-dystonia? Posturing?:    -no improvement really after stopping haldol  -MRI of head and neck do not indicate an injury which would lead to clinical presentation we're seeing. Consider f/u MRI next  week   Baclofen increased to 10 3 times daily on 7/11 without much effect  Gabapentin as above   7/14, increase dantrium to TID--    --  bi-valve cast   7/15. May not be able to tolerate TID dantrium 15.  Tachycardia/elevated blood pressure  Continue propanolol which was increased to 40 3 times daily on 7/11 16.  Transaminitis  LFTs elevated on 7/10, continue to monitor      LOS: 6 days A FACE TO FACE EVALUATION WAS PERFORMED  Meredith Staggers 07/25/2019, 11:46 AM

## 2019-07-25 NOTE — Progress Notes (Signed)
SLP Cancellation Note  Patient Details Name: Chase Caldwell MRN: [id number] DOB: 1980/12/06   Cancelled treatment:       Patient missed 60 minutes of skilled SLP intervention today due to fatigue. SLP attempted multiple times but patient asleep and unable to maintain arousal.  NT checked his blood glucose level and performed a bladder scan and patient remained asleep. RN aware. Will re-attempt as schedule allows.                                                                                                Rylyn Zawistowski 07/25/2019, 2:35 PM

## 2019-07-25 NOTE — Progress Notes (Signed)
Occupational Therapy Note  Patient Details  Name: Chase Caldwell MRN: [id number] Date of Birth: 1980/11/02    OT returned to remove cast. He denies pain from cast and appeared to tolerate it well.   Pt reported he had been incontinent of bowel.  Assisted NT with peri care with pt rolling [name] and [name] with mod facilitation.  He demonstrated increased extensor spasticity of lt UE while rolling, but with cues and facilitation was able to actively break through the spasticity to flex [name] elbow consistently. Passive stretch performed to [name] wrist and hand into composite extension.  Able to achieve full extension with progressive stretching.    Pt able to recall therapist's name from earlier session, and asked appropriate questions re: the cast.  After peri care completed, cast was bi-valved and removed with resultant increase in extensor spasticity of [name] LE and bil UEs once cast was removed.  No evidence of pressure noted.  Will line edges of cast and add strapping then place on pt tomorrow and will educate staff on wearing schedule.   [name]., OTR/L Acute Rehabilitation Services Pager [phone] Office [phone]    [name] 07/25/2019, 6:07 PM

## 2019-07-26 ENCOUNTER — Inpatient Hospital Stay (HOSPITAL_COMMUNITY): Payer: Medicaid Other | Admitting: Speech Pathology

## 2019-07-26 ENCOUNTER — Inpatient Hospital Stay (HOSPITAL_COMMUNITY): Payer: Medicaid Other | Admitting: Physical Therapy

## 2019-07-26 ENCOUNTER — Inpatient Hospital Stay (HOSPITAL_COMMUNITY): Payer: Medicaid Other

## 2019-07-26 LAB — GLUCOSE, CAPILLARY
Glucose-Capillary: 98 mg/dL (ref 70–99)
Glucose-Capillary: 104 mg/dL — ABNORMAL HIGH (ref 70–99)
Glucose-Capillary: 99 mg/dL (ref 70–99)
Glucose-Capillary: 109 mg/dL — ABNORMAL HIGH (ref 70–99)
Glucose-Capillary: 114 mg/dL — ABNORMAL HIGH (ref 70–99)

## 2019-07-26 MED ORDER — ALUM & MAG HYDROXIDE-SIMETH 200-200-20 MG/5ML PO SUSP
30.0000 mL | Freq: Once | ORAL | Status: AC
  Administered 2019-07-26: 30 mL via ORAL
  Filled 2019-07-26: qty 30

## 2019-07-26 MED ORDER — DANTROLENE SODIUM 25 MG PO CAPS
25.0000 mg | ORAL_CAPSULE | Freq: Four times a day (QID) | ORAL | Status: DC
  Administered 2019-07-26 – 2019-07-28 (×8): 25 mg
  Filled 2019-07-26 (×9): qty 1

## 2019-07-26 NOTE — Progress Notes (Signed)
Occupational Therapy Session Note  Patient Details  Name: Chase Caldwell MRN: [id number] Date of Birth: 12-09-1980  Today's Date: 07/26/2019 OT Individual Time: 1050-1150 OT Individual Time Calculation (min): 60 min    Short Term Goals: Week 1:  OT Short Term Goal 1 (Week 1): Pt will sitEOB with mod A in prep for functional ADL task OT Short Term Goal 2 (Week 1): Pt will self feed with AE with max A OT Short Term Goal 3 (Week 1): Pt will tolerate sitting upright out of bed for ~3 hrs OT Short Term Goal 4 (Week 1): Pt will roll to right and left with max A of one caregiver  Skilled Therapeutic Interventions/Progress Updates:    pt received in bed asleep with Second OT present to apply bivalve cast. This clinician begins stretching R ankle into dorsiflexion while second OT prepares cast. Pt sound asleep (likely d/t medication increase for spasticity) throughout session. This clinician begins bending knee and internally rotate while second clinician DF, everts, and extends toes and rehab tech places cast under heel. Pt able to achieve 54* of DF in cast. 2 places of skin breakdown already present on ankle and placed  Above headboard and communicated with RN staff/treating team for monitoring. Post op shoe provided for weight bearing and heel lift/wedge applied to improve WB of cast into post op shoe. Exited session with pt seated in bed, exit alarm on and call light in reach   Therapy Documentation Precautions:  Precautions Precautions: Fall, Other (comment) Precaution Comments: PEG, significant hypertonia, right ankle PF contracture Required Braces or Orthoses: Other Brace Other Brace: abdominal binder (to keep PEG safe) Restrictions Weight Bearing Restrictions: No General:   Vital Signs:   Pain:   ADL: ADL Eating: Dependent Where Assessed-Eating: Bed level Grooming: Dependent Where Assessed-Grooming: Wheelchair Upper Body Bathing: Dependent Where Assessed-Upper Body Bathing:  Wheelchair Lower Body Bathing: Dependent Where Assessed-Lower Body Bathing: Edge of bed Upper Body Dressing: Dependent Where Assessed-Upper Body Dressing: Wheelchair Lower Body Dressing: Dependent Toileting: Dependent Toilet Transfer: Not assessed Social research officer, government: Not assessed Vision   Perception    Praxis   Exercises:   Other Treatments:     Therapy/Group: Individual Therapy  Tonny Branch 07/26/2019, 12:29 PM

## 2019-07-26 NOTE — Progress Notes (Signed)
Occupational Therapy Note  Patient Details  Name: Chase Caldwell MRN: [id number] Date of Birth: [birth year]  Alerted to redness at 5th MCP from the cast.  Cast removed, and gel padding added to this area and along the distal portion, under his toes to dissipate pressure.  Cast velcro and strapping modified.  Cast reapplied with significant extensor spasticity with dorsiflexion and inversion resulting in poor fit of the foot portion of the cast as the force of the spasticity lifts the top portion of th cast away from the bottom causing increased pressure along the medial aspect of his heal, and the volar aspect of his foot.  Attempted spasticity inhibitory techniques without lasting results, and attempted repositioning the cast.  Pt requested the cast be removed due to increased pain and spasticity.   Will keep cast off for the night and reattempt wear in the tomorrow.     [name]., OTR/L Acute Rehabilitation Services Pager [phone] Office [phone]    [name] 07/26/2019, 5:51 PM

## 2019-07-26 NOTE — Progress Notes (Signed)
Physical Therapy Session Note  Patient Details  Name: Chase Caldwell MRN: [id number] Date of Birth: March 03, 1980  Today's Date: 07/26/2019 PT Individual Time: 1400-1500 PT Individual Time Calculation (min): 60 min   Short Term Goals: Week 1:  PT Short Term Goal 1 (Week 1): Pt will perform supine>sit with max assist of 1 person PT Short Term Goal 2 (Week 1): Pt will perform sit>supine with +2 max assist PT Short Term Goal 3 (Week 1): Pt will perform bed<>chair transfers with +2 max assist PT Short Term Goal 4 (Week 1): Pt will perform sit<>stands with +2 max assist PT Short Term Goal 5 (Week 1): Pt will maintain static sitting balance EOM for at least 1 minute with supervision  Skilled Therapeutic Interventions/Progress Updates:   Pt received supine in bed and agreeable to PT. PT assessed skin on the RLE in positioning bifold cast. Mild blanching redness in the distal head of the 5th metatarsal. Re-applied cast with RLE in hip/knee and ankle flexion to prevent extensor tone response. Rolling R and L to don pants with max assist for R and total A to manage clothing. Supine>sit transfer with max assist and cues to use RUE to push into sitting EOB. Sitting balance OEB while donning shoe and post op shoe. Noted poor fit of post op shoe due to casting position. Max A+2 SB transfer to [facility] with LLE blocked to maintain position and allow push through LE to assist with scoot.   Pt transported to rehab gym. LE NMR to perform hip/knee flexion/extension in synergy x 10 BLE against manual resistance. Hip abduction/adduciton with AROM for abd, AAROM into adduction due to hip ER/extensor tone. RLE AAROM for forward reach, UE retraction with fingers and wrists maintained in extension following slow passive stretch.   PT applied velcro to posterior/medial aspect of post op shoe to improve position of shoe and maintain contact in counter to alow WB in standing or transfers. Unable to demonstrate adequate adhereance to  shoe to assist in shoe positioning. Pt returned to room and performed SB transfer bed with max Assist +2. Sit>supine completed with max assist and +2 present for safety. pt and left supine in bed with call bell in reach and all needs met.        Therapy Documentation Precautions:  Precautions Precautions: Fall, Other (comment) Precaution Comments: PEG, significant hypertonia, right ankle PF contracture Required Braces or Orthoses: Other Brace Other Brace: abdominal binder (to keep PEG safe) Restrictions Weight Bearing Restrictions: No    Vital Signs: Therapy Vitals Temp: 98.7 F (37.1 C) Pulse Rate: 94 Resp: 18 BP: (!) 149/90 Patient Position (if appropriate): Lying Oxygen Therapy SpO2: 99 % O2 Device: Room Air Pain: Denies    Therapy/Group: Individual Therapy  Lorie Phenix 07/26/2019, 3:10 PM

## 2019-07-26 NOTE — Progress Notes (Signed)
Orthopedic Tech Progress Note Patient Details:  Chase Caldwell 1980/01/14 [id number]  Ortho Devices Type of Ortho Device: Postop shoe/boot Ortho Device/Splint Location: RLE Ortho Device/Splint Interventions: Ordered, Application   Post Interventions Patient Tolerated: Well Instructions Provided: Care of device   Janit Pagan 07/26/2019, 11:47 AM

## 2019-07-26 NOTE — Significant Event (Signed)
Staff were about to perform bath and noted that PEG tube was not present. #14 fr foley catheter placed per protocol. Pt tolerated well. Will inform MD/PA Abdominal binder will be placed after bath.

## 2019-07-26 NOTE — Progress Notes (Signed)
Subjective:    Chase Caldwell is a 39 y.o. male now 6 weeks s/p facial reconstruction. No acute events since surgery. He is doing well with improvements in cognition.    Objective:    BP (!) 149/90 (BP Location: Right Arm)   Pulse 94   Temp 98.7 F (37.1 C)   Resp 18   Ht 6' (1.829 m)   Wt 118.8 kg   SpO2 99%   BMI 35.52 kg/m   General: Resting comfortably in bed.   HEENT: Edema has subsided. # 8 minimally mobile. Good soft tissue projection and contour.   CV: regular rate on monitors   Pulm: Ventilated   Abdomen: Abdomen in non-distended  Extremities: WWP  Assessment:    Chase Caldwell is a 39 y.o. male now POD 7 s/p facial reconstruction. He continues to progress well.   Injuries: - Right comminuted frontal sinus fracture (anterior table only) - Orbital fracture of Medial and Superior orbital wall - Comminuted Right nasal bone fracture  - Comminuted Septal Fracture - Maxillary fracture of the right anterior maxilla - Soft tissue injury of the submandibular region, right medial lid and right brow.     Plan:    Merocel packs removed on 06/19/2019 - Wires removed 7/15  - Please maintain a soft non-chew diet for an additional 4 weeks.  - Please have patient schedule an appointment without office following discharge.    I will sign off at this point. Please contact me with questions or concerns.   Dr. Fabian Sharp [facility] [address] [place] [address] [phone]

## 2019-07-26 NOTE — Progress Notes (Signed)
Speech Language Pathology Weekly Progress and Session Note  Patient Details  Name: Chase Caldwell MRN: [id number] Date of Birth: Sep 01, 1980  Beginning of progress report period: July 19, 2019 End of progress report period: July 26, 2019  Today's Date: 07/26/2019 SLP Individual Time: 4650-3546 SLP Individual Time Calculation (min): 25 min  Short Term Goals: Week 1: SLP Short Term Goal 1 (Week 1): Pt will participate in continued assessment of cognitive lingustic skills. SLP Short Term Goal 1 - Progress (Week 1): Met SLP Short Term Goal 2 (Week 1): Pt communicate wants/needs at the phrase level with min A verbal cues for 80% intelligibility. SLP Short Term Goal 2 - Progress (Week 1): Met SLP Short Term Goal 3 (Week 1): Pt will produce specific speech sounds targeting alevolar, palatal and velar placement with 60% accuracy. SLP Short Term Goal 3 - Progress (Week 1): Met SLP Short Term Goal 4 (Week 1): Pt will consume dys 1 textures and thin liquids with minimal s/s aspriation and mod A verbal cues for use of swallow strategies. SLP Short Term Goal 4 - Progress (Week 1): Met SLP Short Term Goal 5 (Week 1): Pt will demonstrate recall of novel and daily information with min A verbal cues. SLP Short Term Goal 5 - Progress (Week 1): Met    New Short Term Goals: Week 2: SLP Short Term Goal 1 (Week 2): Pt will consume dys 1 textures and thin liquids with minimal s/s aspriation and Min A verbal cues for use of swallow strategies. SLP Short Term Goal 2 (Week 2): Patient will demonstrate efficient mastication and complete oral clearance of Dys. 2 textures over 2 sessions without overt s/s of aspiration and Min A verbal cues prior to upgrade. SLP Short Term Goal 3 (Week 2): Patient will utilize speech intelligibility strategies at the sentence level with Min A verbal cues to achieve ~80% intelligibility. SLP Short Term Goal 4 (Week 2): Patient will recall new, daily information with supervision verbal  cues.  Weekly Progress Updates: Patient has made functional gains and has met 5 of 5 STGs this reporting period. Currently, patient is consuming Dys. 1 textures with thin liquids with minimal overt s/s of aspiration with overall Mod A verbal cues for use of swallowing compensatory strategies. Patient demonstrates improved speech intelligibility at the phrase level and is overall ~80% intelligible with Min verbal cues. Patient also demonstrates improved recall of new information but continues to demonstrate intermittent episodes of yelling and crying out in pain due to tone. Medical team is aware and addressing. Patient and family education ongoing. Patient would benefit from continued skilled SLP intervention to maximize his cognitive, speech and swallowing function prior to discharge.    Intensity: Minumum of 1-2 x/day, 30 to 90 minutes Frequency: 3 to 5 out of 7 days Duration/Length of Stay: 22-30 days Treatment/Interventions: Cognitive remediation/compensation;Cueing hierarchy;Functional tasks;Speech/Language facilitation;Patient/family education;Oral motor exercises;Dysphagia/aspiration precaution training   Daily Session  Skilled Therapeutic Interventions: Skilled treatment session focused on speech and swallowing goals. SLP facilitated session by providing Min A verbal cues for use of speech intelligibility at the phrase level to express wants/needs with overall ~80% intelligibility. Patient consumed thin liquids via straw without overt s/s of aspiration but required Mod verbal cues for small sips and to breath after completion of swallow. Patient with built up dried secretions in oral cavity that SLP attempted to clear with oral care via the suction toothbrush. SLP was mildly successful and strict and consistent oral care is needed by staff. Patient left  upright in bed with alarm on and all needs within reach. Continue with current plan of care.       Pain Pain was managed throughout  session  Therapy/Group: Individual Therapy  Chase Caldwell 07/26/2019, 3:15 PM

## 2019-07-26 NOTE — Progress Notes (Signed)
[place] PHYSICAL MEDICINE & REHABILITATION PROGRESS NOTE  Subjective/Complaints: Restless night. C/o spasms in arms/legs as well as chest pain, cough.   ROS: Limited due to cognitive/behavioral    Objective: Vital Signs: Blood pressure 132/79, pulse 91, temperature 97.7 F (36.5 C), temperature source Oral, resp. rate 20, height 6' (1.829 m), weight 118.8 kg, SpO2 98 %. No results found. No results for input(s): WBC, HGB, HCT, PLT in the last 72 hours. No results for input(s): NA, K, CL, CO2, GLUCOSE, BUN, CREATININE, CALCIUM in the last 72 hours.  Physical Exam: BP 132/79 (BP Location: Right Arm)   Pulse 91   Temp 97.7 F (36.5 C) (Oral)   Resp 20   Ht 6' (1.829 m)   Wt 118.8 kg   SpO2 98%   BMI 35.52 kg/m  Constitutional: No distress . Vital signs reviewed. HEENT: EOMI, oral membranes moist Neck: supple Cardiovascular: RRR without murmur. No JVD    Respiratory/Chest: CTA Bilaterally without wheezes or rales. Normal effort    GI/Abdomen: BS +, non-tender, non-distended Ext: no clubbing, cyanosis, or edema Psych: confused, follows basic commands Musc: No edema in extremities.  No tenderness in extremities. Neuro: alert Facial paralysis Dysarthria,  Vision limited Extensor tone L>R. DTR's ?1+ UE and 3+ LE's. Sustained clonus in both LE. 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 Decreased LT in both arms and leg. Above shoulders/lower neck/clavicular area sensation seems improved Motor: moves all 4's   Assessment/Plan: 1. 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  Physiatrist is providing close team supervision and 24 hour management of active medical problems listed below.  Physiatrist and rehab team continue to assess barriers to discharge/monitor  patient progress toward functional and medical goals  Care Tool:  Bathing        Body parts bathed by helper: Right arm, Left arm, Chest, Abdomen, Front perineal area, Buttocks, Right upper leg, Left upper leg, Right lower leg, Left lower leg, Face     Bathing assist Assist Level: 2 Helpers     Upper Body Dressing/Undressing Upper body dressing        Upper body assist Assist Level: Total Assistance - Patient < 25%    Lower Body Dressing/Undressing Lower body dressing      What is the patient wearing?: Incontinence brief, Underwear/pull up     Lower body assist Assist for lower body dressing: Total Assistance - Patient < 25%     Toileting Toileting    Toileting assist Assist for toileting: 2 Helpers     Transfers Chair/bed transfer  Transfers assist  Chair/bed transfer activity did not occur: Safety/medical concerns (safety/medical b/c we introduced new technique??)  Chair/bed transfer assist level: 2 Helpers     Locomotion Ambulation   Ambulation assist   Ambulation activity did not occur: Safety/medical concerns          Walk 10 feet activity   Assist  Walk 10 feet activity did not occur: Safety/medical concerns        Walk 50 feet activity   Assist Walk 50 feet with 2 turns activity did not occur: Safety/medical concerns         Walk 150 feet activity   Assist Walk 150 feet activity did not occur: Safety/medical concerns         Walk 10 feet on  uneven surface  activity   Assist Walk 10 feet on uneven surfaces activity did not occur: Safety/medical concerns         Wheelchair     Assist Will patient use wheelchair at discharge?:  (likely will require dependent w/c transport)      Wheelchair assist level: Dependent - Patient 0%      Wheelchair 50 feet with 2 turns activity    Assist        Assist Level: Dependent - Patient 0%   Wheelchair 150 feet activity     Assist     Assist Level:  Dependent - Patient 0%      Medical Problem List and Plan: 1. Diffuse weakness with hypertonicity and deficits with self-care, swallowing, cognition secondary to TBI with significant Psych history.  Continue CIR      2.  Antithrombotics: -DVT/anticoagulation:  Pharmaceutical: Lovenox             -antiplatelet therapy: N/a 3. Chronic LBP/Pain Management: Managed by tramadol prn.     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  4. Mood/agitation/hx of bipolar disorder: Neuropsych and psychiatry to follow during his stay. LCSW to follow for evaluation and support             -antipsychotic agents:  seroquel  -continue cymbalta, zoloft  -pain a big driver for pain  -added gabapentin as above  -ativan BID and prn, no haldol!  -psych also following this patient 5. Neuropsych: This patient is not fully capable of making decisions on his own behalf. 6. Skin/Wound Care: Routine pressure relief measures.  7. Fluids/Electrolytes/Nutrition: Monitor I/Os.  11. Facial Fx/Maxillary fracture s/p repair with associated dysphagia: Wires apparently removed by ENT yesterday              Dysphagia 1 thin diet--advance per SLP  Patient pulled out PEG again. Replaced with foley. Will not replace this again if he pulls foley out. If he's eating well enough, we'll just remove it all together.   12. ABLA: Improving.    Hemoglobin 12.2 on 7/12  Continue to monitor 13. Prediabetes: Hyperglycemia due to tube feeds. Continue feeds at nights as intake remains variable.    Relatively controlled on 7/12             Monitor with increased mobility 14.  Severe muscle spasms/tone-dystonia? Posturing?:    -no improvement really after stopping haldol   -MRI of head and neck do not indicate an injury which would lead to clinical presentation we're seeing. Consider f/u MRI next  week   Baclofen increased to 10 3 times daily on 7/11 without much effect  7/16 Dantrium 25mg  TID--will try QID      bi-valve cast per OT. We'll see how he tolerates.    -appreciate Wendi Conarpe's help! 15.  Tachycardia/elevated blood pressure  Continue propanolol which was increased to 40 3 times daily on 7/11 16.  Transaminitis  LFTs elevated on 7/10, continue to monitor      LOS: 7 days A FACE TO FACE EVALUATION WAS PERFORMED  Meredith Staggers 07/26/2019, 10:49 AM

## 2019-07-26 NOTE — Progress Notes (Addendum)
Patient complaining of difficulty breathing that "feels like phlegm," as well as chest pain "like someone is sitting on my chest." He also states he is dizzy/lightheaded. [name] VS are WNL (temp 97.7, BP 132/79, RR 20, SpO2 98%, HR 91). Patient screaming for last hour or so due to pain for spasms. PRN pain medications (tramadol) and ativan given previously at 01:40. RT called to give PRN Duoneb treatment for chest congestion; Given at 03:10 and this nurse suctioned patient orally. On-call provider notified of chest pain, breathing issues, and dizziness. New orders for Maalax; Given at 03:19. No ECG at this time.

## 2019-07-26 NOTE — Progress Notes (Signed)
Patient still complaining of "stabbing" chest pain this morning. Linna Hoff, [place] notified. Ordered ECG. ECG obtained.

## 2019-07-27 ENCOUNTER — Inpatient Hospital Stay (HOSPITAL_COMMUNITY): Payer: Medicaid Other | Admitting: Speech Pathology

## 2019-07-27 LAB — GLUCOSE, CAPILLARY
Glucose-Capillary: 116 mg/dL — ABNORMAL HIGH (ref 70–99)
Glucose-Capillary: 83 mg/dL (ref 70–99)
Glucose-Capillary: 86 mg/dL (ref 70–99)
Glucose-Capillary: 88 mg/dL (ref 70–99)
Glucose-Capillary: 92 mg/dL (ref 70–99)
Glucose-Capillary: 93 mg/dL (ref 70–99)

## 2019-07-27 NOTE — Progress Notes (Addendum)
Occupational Therapy Session Note  Patient Details  Name: Chase Caldwell MRN: [id number] Date of Birth: [birth year]  Today's Date: 07/27/2019 OT Individual Time: 1245-1255 AND 1445 1500 OT Individual Time Calculation (min): 10 min AND 15 MIN    Short Term Goals: Week 1:  OT Short Term Goal 1 (Week 1): Pt will sitEOB with mod A in prep for functional ADL task OT Short Term Goal 2 (Week 1): Pt will self feed with AE with max A OT Short Term Goal 3 (Week 1): Pt will tolerate sitting upright out of bed for ~3 hrs OT Short Term Goal 4 (Week 1): Pt will roll to right and left with max A of one caregiver  Skilled Therapeutic Interventions/Progress Updates:    1:1. Pt received in bed asleep. OT announced self and stated intention to stretch LLE and attempt Bivalve cast again. Pt aroused enough to state "ok," however pt soundly asleep while OT provides ROM for tone management and applying cast. This clinician was able to range enough to apply cast and alerted nursing staff that if pt begins to complain of pain in foot to remove it. OT will check in in 1.5 hours to inspect skin and see if pt able to tolerate it.   Session 2: Pt received in bed, screaming, "my cast hurts." Pt reporting the lateral aspect of L forefoot near pinky tow hurting. pt tone separated bivalve cast velcro pieces [per SLP who was just with pt, this was not the case 10 min prior]. Pt with slight redness around 5th MCP where pt reporting pain and slight redness across volar aspect of foot which resloves in 30 seconds after taking cast off. After cast removed LE not increases and Les lifting up off of bed and BUE in full extension synergy requiring VC for relaxing/PROM of BUE. Exited sesion with pt seated in bed, exit alram on and call light in reach.    Therapy Documentation Precautions:  Precautions Precautions: Fall, Other (comment) Precaution Comments: PEG, significant hypertonia, right ankle PF contracture Required Braces or  Orthoses: Other Brace Other Brace: abdominal binder (to keep PEG safe) Restrictions Weight Bearing Restrictions: No General:   Vital Signs:   Pain:   ADL: ADL Eating: Dependent Where Assessed-Eating: Bed level Grooming: Dependent Where Assessed-Grooming: Wheelchair Upper Body Bathing: Dependent Where Assessed-Upper Body Bathing: Wheelchair Lower Body Bathing: Dependent Where Assessed-Lower Body Bathing: Edge of bed Upper Body Dressing: Dependent Where Assessed-Upper Body Dressing: Wheelchair Lower Body Dressing: Dependent Toileting: Dependent Toilet Transfer: Not assessed Social research officer, government: Not assessed Vision   Perception    Praxis   Exercises:   Other Treatments:     Therapy/Group: Individual Therapy  Tonny Branch 07/27/2019, 12:56 PM

## 2019-07-27 NOTE — Progress Notes (Signed)
Speech Language Pathology Daily Session Note  Patient Details  Name: Chase Caldwell MRN: [id number] Date of Birth: 1980-02-11  Today's Date: 07/27/2019 SLP Individual Time: 6244-6950 SLP Individual Time Calculation (min): 30 min  Short Term Goals: Week 2: SLP Short Term Goal 1 (Week 2): Pt will consume dys 1 textures and thin liquids with minimal s/s aspriation and Min A verbal cues for use of swallow strategies. SLP Short Term Goal 2 (Week 2): Patient will recall new, daily information with supervision verbal cues. SLP Short Term Goal 3 (Week 2): Patient will utilize speech intelligibility strategies at the sentence level with Min A verbal cues to achieve ~80% intelligibility. SLP Short Term Goal 4 (Week 2): Patient will recall new, daily information with supervision verbal cues.  Skilled Therapeutic Interventions: Skilled treatment session focused on swallowing and speech goals. SLP facilitated session by providing total A for self-feeding with lunch meal of dys. 1 textures with thin liquids. Patent consumed meal without overt s/s of aspiration and required Min verbal cues for use of swallowing compensatory strategies. Min verbal cues were also needed for use of speech intelligibility strategies at the sentence level to achieve ~90% intelligibility. Patient left upright in bed with alarm on and all needs within reach. Continue with current plan of care.      Pain No/Denies Pain   Therapy/Group: Individual Therapy  Cyndy Braver 07/27/2019, 6:19 AM

## 2019-07-27 NOTE — Progress Notes (Signed)
[place] PHYSICAL MEDICINE & REHABILITATION PROGRESS NOTE  Subjective/Complaints: Does better at night when wife is in room. Didn't tolerate bi-valve real well. Pt himself said he did better with the solid cast. Pressure points noted as per OT note. Not much change in spasms. Appetite is good  ROS: Limited due to cognitive/behavioral    Objective: Vital Signs: Blood pressure 122/74, pulse 84, temperature 98.7 F (37.1 C), temperature source Oral, resp. rate 18, height 6' (1.829 m), weight 119 kg, SpO2 97 %. No results found. No results for input(s): WBC, HGB, HCT, PLT in the last 72 hours. No results for input(s): NA, K, CL, CO2, GLUCOSE, BUN, CREATININE, CALCIUM in the last 72 hours.  Physical Exam: BP 122/74 (BP Location: Right Arm)   Pulse 84   Temp 98.7 F (37.1 C) (Oral)   Resp 18   Ht 6' (1.829 m)   Wt 119 kg   SpO2 97%   BMI 35.58 kg/m  Constitutional: No distress . Vital signs reviewed. HEENT: EOMI, oral membranes moist, pink Neck: supple Cardiovascular: RRR without murmur. No JVD    Respiratory/Chest: CTA Bilaterally without wheezes or rales. Normal effort    GI/Abdomen: BS +, non-tender, non-distended Ext: no clubbing, cyanosis, or edema Psych: pleasant, confused with fair insight.  Musc: No edema in extremities.  No tenderness in extremities. Neuro: alert Facial paralysis Dysarthria,  Vision remains limited Extensor tone L>R. DTR's ?1+ UE and 3+ LE's. Sustained clonus in both LE. 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 Decreased LT in both arms and leg. Above shoulders/lower neck/clavicular area sensation seems improved Motor: moves all 4's   Assessment/Plan: 1. 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  Physiatrist is providing close team supervision  and 24 hour management of active medical problems listed below.  Physiatrist and rehab team continue to assess barriers to discharge/monitor patient progress toward functional and medical goals  Care Tool:  Bathing        Body parts bathed by helper: Right arm, Left arm, Chest, Abdomen, Front perineal area, Buttocks, Right upper leg, Left upper leg, Right lower leg, Left lower leg, Face     Bathing assist Assist Level: 2 Helpers     Upper Body Dressing/Undressing Upper body dressing        Upper body assist Assist Level: Total Assistance - Patient < 25%    Lower Body Dressing/Undressing Lower body dressing      What is the patient wearing?: Incontinence brief, Underwear/pull up     Lower body assist Assist for lower body dressing: Total Assistance - Patient < 25%     Toileting Toileting    Toileting assist Assist for toileting: 2 Helpers     Transfers Chair/bed transfer  Transfers assist  Chair/bed transfer activity did not occur: Safety/medical concerns (safety/medical b/c we introduced new technique??)  Chair/bed transfer assist level: 2 Helpers     Locomotion Ambulation   Ambulation assist   Ambulation activity did not occur: Safety/medical concerns          Walk 10 feet activity   Assist  Walk 10 feet activity did not occur: Safety/medical concerns        Walk 50 feet activity   Assist Walk 50 feet with 2 turns activity did not occur: Safety/medical concerns         Walk  150 feet activity   Assist Walk 150 feet activity did not occur: Safety/medical concerns         Walk 10 feet on uneven surface  activity   Assist Walk 10 feet on uneven surfaces activity did not occur: Safety/medical concerns         Wheelchair     Assist Will patient use wheelchair at discharge?:  (likely will require dependent w/c transport)      Wheelchair assist level: Dependent - Patient 0%      Wheelchair 50 feet with 2 turns  activity    Assist        Assist Level: Dependent - Patient 0%   Wheelchair 150 feet activity     Assist     Assist Level: Dependent - Patient 0%      Medical Problem List and Plan: 1. Diffuse weakness with hypertonicity and deficits with self-care, swallowing, cognition secondary to TBI with significant Psych history.  Continue CIR      2.  Antithrombotics: -DVT/anticoagulation:  Pharmaceutical: Lovenox             -antiplatelet therapy: N/a 3. Chronic LBP/Pain Management: Managed by tramadol prn.     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  4. Mood/agitation/hx of bipolar disorder: Neuropsych and psychiatry to follow during his stay. LCSW to follow for evaluation and support             -antipsychotic agents:  seroquel  -continue cymbalta, zoloft  -added gabapentin as above  -ativan BID and prn, no haldol!  -psych also following this patient  52 wife and family will now stay in his room each night as this seems to help the most with his anxiety and emotional lability.  5. Neuropsych: This patient is not fully capable of making decisions on his own behalf. 6. Skin/Wound Care: Routine pressure relief measures.  7. Fluids/Electrolytes/Nutrition: Monitor I/Os.  11. Facial Fx/Maxillary fracture s/p repair with associated dysphagia: Wires apparently removed by ENT yesterday              Dysphagia 1 thin diet--advance per SLP--eating well  Continue Foley/GT thru the weekend.   12. ABLA: Improving.    Hemoglobin 12.2 on 7/12  Continue to monitor--recheck monday 13. Prediabetes: Hyperglycemia due to tube feeds. Continue feeds at nights as intake remains variable.    Relatively controlled on 7/12             Monitor with increased mobility 14.  Severe muscle spasms/tone-dystonia? Posturing?:    -no improvement really after  stopping haldol   -MRI of head and neck do not indicate an injury which would lead to clinical presentation we're seeing. Consider f/u MRI next week   Baclofen increased to 10 3 times daily on 7/11 without much effect  7/16 Dantrium 25mg  TID--will try QID   7/17 continue bi-valve cast trials. Might do better with solid cast 15.  Tachycardia/elevated blood pressure  Continue propanolol which was increased to 40 3 times daily on 7/11 16.  Transaminitis  LFTs elevated on 7/10, continue to monitor      LOS: 8 days A FACE TO FACE EVALUATION WAS PERFORMED  Meredith Staggers 07/27/2019, 10:40 AM

## 2019-07-27 NOTE — Progress Notes (Signed)
Occupational Therapy Weekly Progress Note  Patient Details  Name: Chase Caldwell MRN: [id number] Date of Birth: October 04, 1980  Beginning of progress report period: July 20, 2019 End of progress report period: July 27, 2019   Patient has met 2 of 4 short term goals.  Pt is making slow progress this reporting period requiring mod-max A of 2 for all bed mobility during ADLS, SB transfers and total A for BADL tasks. 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 Bivalve casting has been attempted, however pt unable to tolerate it and tone is significant enough to rip the velcro apart separating the two pieces and creating pressure points. With increased spasm medication pt has had increased lethargy, but remains motivated to work on therapy. Pt is able to self feed with MAX A to scoop food, but pt is able to bring food to mouth with occasional overshooting.   Patient continues to demonstrate the following deficits: muscle weakness and muscle joint tightness, decreased cardiorespiratoy endurance, impaired timing and sequencing, abnormal tone, unbalanced muscle activation and decreased coordination, decreased visual acuity, decreased attention, decreased awareness, decreased problem solving, decreased safety awareness, decreased memory and delayed processing and decreased sitting balance, decreased standing balance, decreased postural control and decreased balance strategies and therefore will continue to benefit from skilled OT intervention to enhance overall performance with BADL and Reduce care partner burden.  Patient progressing toward long term goals..  Continue plan of care.  OT Short Term Goals Week 1:  OT Short Term Goal 1 (Week 1): Pt will sitEOB with mod A in prep for functional ADL task OT Short Term Goal 1 - Progress (Week 1): Met OT Short Term Goal 2 (Week 1): Pt will self feed with AE with max A OT Short Term  Goal 2 - Progress (Week 1): Met OT Short Term Goal 3 (Week 1): Pt will tolerate sitting upright out of bed for ~3 hrs OT Short Term Goal 3 - Progress (Week 1): Progressing toward goal OT Short Term Goal 4 (Week 1): Pt will roll to right and left with max A of one caregiver OT Short Term Goal 4 - Progress (Week 1): Progressing toward goal Week 2:  OT Short Term Goal 1 (Week 2): Pt will roll in B directions with MAX A of 1 caregive to reduce BOC OT Short Term Goal 2 (Week 2): Pt will bridge hips with MIN A of 1 caregiver during ADL to reduce BOC OT Short Term Goal 3 (Week 2): Pt will sit EOB/EOM with MIN A of 1 OT Short Term Goal 4 (Week 2): Pt will complete transfer to TIS with MAX A of 1 in prep for OOB toileting    Tonny Branch 07/27/2019, 4:07 PM

## 2019-07-28 ENCOUNTER — Inpatient Hospital Stay (HOSPITAL_COMMUNITY): Payer: Medicaid Other

## 2019-07-28 LAB — GLUCOSE, CAPILLARY
Glucose-Capillary: 100 mg/dL — ABNORMAL HIGH (ref 70–99)
Glucose-Capillary: 101 mg/dL — ABNORMAL HIGH (ref 70–99)
Glucose-Capillary: 114 mg/dL — ABNORMAL HIGH (ref 70–99)
Glucose-Capillary: 76 mg/dL (ref 70–99)
Glucose-Capillary: 93 mg/dL (ref 70–99)

## 2019-07-28 MED ORDER — CLONAZEPAM 0.5 MG PO TABS
0.5000 mg | ORAL_TABLET | Freq: Three times a day (TID) | ORAL | Status: DC
  Administered 2019-07-28 – 2019-07-30 (×7): 0.5 mg via ORAL
  Filled 2019-07-28 (×7): qty 1

## 2019-07-28 MED ORDER — DANTROLENE SODIUM 25 MG PO CAPS
25.0000 mg | ORAL_CAPSULE | Freq: Two times a day (BID) | ORAL | Status: DC
  Administered 2019-07-28 – 2019-07-30 (×4): 25 mg
  Filled 2019-07-28 (×4): qty 1

## 2019-07-28 MED ORDER — HYDROCODONE-ACETAMINOPHEN 5-325 MG PO TABS
1.0000 | ORAL_TABLET | Freq: Four times a day (QID) | ORAL | Status: DC | PRN
  Administered 2019-07-28 – 2019-08-13 (×21): 1 via ORAL
  Filled 2019-07-28 (×21): qty 1

## 2019-07-28 NOTE — Progress Notes (Signed)
Physical Therapy Weekly Progress Note  Patient Details  Name: Chase Caldwell MRN: [id number] Date of Birth: 1980/10/09  Beginning of progress report period: July 20, 2019 End of progress report period: July 28, 2019  Today's Date: 07/28/2019 PT Individual Time: 7793-9030 PT Individual Time Calculation (min): 76 min   Patient has met 4 of 5 short term goals.  Patient making slow progress with week due to systemic hypertonia and spasticity and fluctuation levels of arousal. Have initiated bivalve casting for R foot to prevent contracture due to significant PF tone. Patient currently not tolerating bivalve cast and pushing out of it increasing risk for pressure wounds. Will attempt circumferential casting this week to reduce risk of pressure injury and for improved pain. Patient currently requires mod-max A +2 for bed mobility, slide board transfers, and sit<>stand transfers, can require a third person in standing to manage R foot due to PF and eversion tone in standing.   Patient continues to demonstrate the following deficits muscle weakness and muscle joint tightness, decreased cardiorespiratoy endurance, impaired timing and sequencing, unbalanced muscle activation, decreased coordination and decreased motor planning, decreased visual acuity, decreased visual perceptual skills and decreased visual motor skills, decreased awareness, decreased problem solving and decreased safety awareness and decreased sitting balance, decreased standing balance, decreased postural control and decreased balance strategies and therefore will continue to benefit from skilled PT intervention to increase functional independence with mobility.  Patient progressing toward long term goals..  Continue plan of care.  PT Short Term Goals Week 1:  PT Short Term Goal 1 (Week 1): Pt will perform supine>sit with max assist of 1 person PT Short Term Goal 1 - Progress (Week 1): Progressing toward goal PT Short Term Goal 2 (Week 1):  Pt will perform sit>supine with +2 max assist PT Short Term Goal 2 - Progress (Week 1): Met PT Short Term Goal 3 (Week 1): Pt will perform bed<>chair transfers with +2 max assist PT Short Term Goal 3 - Progress (Week 1): Met PT Short Term Goal 4 (Week 1): Pt will perform sit<>stands with +2 max assist PT Short Term Goal 4 - Progress (Week 1): Met PT Short Term Goal 5 (Week 1): Pt will maintain static sitting balance EOM for at least 1 minute with supervision PT Short Term Goal 5 - Progress (Week 1): Met Week 2:  PT Short Term Goal 1 (Week 2): Patient will perform supine to sit with max A +1 consistently. PT Short Term Goal 2 (Week 2): Patient will perform sit to supine with mod A +2. PT Short Term Goal 3 (Week 2): Patient will perform basic transfers with mod A +2. PT Short Term Goal 4 (Week 2): Patient will initiate power w/c mobility.  Skilled Therapeutic Interventions/Progress Updates:     Patient in bed asleep upon PT arrival. Patient easily aroused to tactile stimulation and agreeable to PT session. Patient reported 6-8/10 L UE and R LE pain with spasms/tone during session, RN made aware. PT provided repositioning, rest breaks, manual pressure for inhibition and massage to reduce tone (mainly L UE)and distraction as pain interventions throughout session. Focused session on transfer training in order to change bed for [name] to allow for weight bearing in bed to manage LE tone.   Therapeutic Activity: Bed Mobility: Patient performed rolling R /L with max A to don shorts with total A bed level, also donned socks with total A. supine to sit with max A +2 and sit to supine with max-mod A +2. Provided  verbal cues for performing side-lying to/from sitting during mobility for increased UE leverage when coming to sitting and improved trunk control when lying down. PT donned B shoes with total a with patient sitting EOB. Patient required min A initially then progressed to supervision for sitting  balance with cues to reduce posterior lean in sitting.  Transfers: Patient performed slide board transfers from bed>[name] and [name]>[name] with max A of 1 person on first trial for a level transfer and max A +2 on second trial due to inclined transfer and total A for board placement. Provided cues for hand placement, board placement, and head-hips relationship for proper technique and decreased assist with transfers.  Patient was sweaty from increased tone in the bed and transfers. PT performed UB bathing and dressing with max-total A due to increased B UE tone. Patient able to recall sequencing for donning/doffing a shirt from a previous session.   Took patient to the Day room while his beds were changed out. Placed several large objects in front of the patient with blinds open and closed and back lit or front lit by windows. Patient unable to identify objects visually at all. Was able to identify a medium ball, small cone, and horseshoe using stereognosis with hand over hand assist to assess the object. Discussed upcoming power chair assessment on Tuesday with [name] from stalls. Educated on potential for improved sitting posture, tone control, accomodation for PF tone for improved comfort, and patient's ability to use power controls for increased independence with locomotion. Discussed use of technology for minimizing collisions due to vision loss. Patient excited about this saying, "that sounds awesome."  Patient in [place] bed at end of session with breaks locked, bed alarm set, and all needs within reach. Noted increased UE and LE tone throughout session today. R foot with significant PF with toes pointed throughout session. Some improvement when seated with leg rest donned. Educated patient on placement of circumferential cast this week to reduce tone, patient in agreement that the circumferential cast felt better than the bivalve cast.    Therapy Documentation Precautions:   Precautions Precautions: Fall, Other (comment) Precaution Comments: PEG, significant hypertonia, right ankle PF contracture Required Braces or Orthoses: Other Brace Other Brace: abdominal binder (to keep PEG safe) Restrictions Weight Bearing Restrictions: No  Therapy/Group: Individual Therapy   L  PT, DPT  07/28/2019, 4:39 PM

## 2019-07-28 NOTE — Progress Notes (Signed)
[facility]YSICAL MEDICINE & REHABILITATION PROGRESS NOTE  Subjective/Complaints: Pt with worsening extensor tone despite addition/increase of medications. Pt frequently crying out. Better when family or staff are present. He quickly stopped when I entered the room and addressed him.   ROS: Limited due to cognitive/behavioral     Objective: Vital Signs: Blood pressure (!) 146/98, pulse 71, temperature 98.2 F (36.8 C), resp. rate 18, height 6' (1.829 m), weight 119 kg, SpO2 98 %. No results found. No results for input(s): WBC, HGB, HCT, PLT in the last 72 hours. No results for input(s): NA, K, CL, CO2, GLUCOSE, BUN, CREATININE, CALCIUM in the last 72 hours.  Physical Exam: BP (!) 146/98 (BP Location: Right Arm)   Pulse 71   Temp 98.2 F (36.8 C)   Resp 18   Ht 6' (1.829 m)   Wt 119 kg   SpO2 98%   BMI 35.58 kg/m  Constitutional: No distress . Vital signs reviewed. HEENT: EOMI, oral membranes moist Neck: supple Cardiovascular: RRR without murmur. No JVD    Respiratory/Chest: CTA Bilaterally without wheezes or rales. Normal effort    GI/Abdomen: BS +, non-tender, non-distended Ext: no clubbing, cyanosis, or edema Psych: anxious, redirectable.  Musc: No edema in extremities.  No tenderness in extremities. Neuro: alert Facial paralysis Dysarthria,  Vision remains limited Extensor tone L>R. It is no different today 7/18 than prior days. DTR's ?1+ UE and 3+ LE's. Sustained clonus in both LE. 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 ROM c/w prior exams Decreased LT in both arms and leg. Above shoulders/lower neck/clavicular area sensation seems improved Motor: moves all 4's R>L for the most part   Assessment/Plan: 1. 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  Physiatrist is providing  close team supervision and 24 hour management of active medical problems listed below.  Physiatrist and rehab team continue to assess barriers to discharge/monitor patient progress toward functional and medical goals  Care Tool:  Bathing        Body parts bathed by helper: Right arm, Left arm, Chest, Abdomen, Front perineal area, Buttocks, Right upper leg, Left upper leg, Right lower leg, Left lower leg, Face     Bathing assist Assist Level: 2 Helpers     Upper Body Dressing/Undressing Upper body dressing        Upper body assist Assist Level: Total Assistance - Patient < 25%    Lower Body Dressing/Undressing Lower body dressing      What is the patient wearing?: Incontinence brief, Underwear/pull up     Lower body assist Assist for lower body dressing: Total Assistance - Patient < 25%     Toileting Toileting    Toileting assist Assist for toileting: 2 Helpers     Transfers Chair/bed transfer  Transfers assist  Chair/bed transfer activity did not occur: Safety/medical concerns (safety/medical b/c we introduced new technique??)  Chair/bed transfer assist level: 2 Helpers     Locomotion Ambulation   Ambulation assist   Ambulation activity did not occur: Safety/medical concerns          Walk 10 feet activity   Assist  Walk 10 feet activity did not occur: Safety/medical concerns        Walk 50 feet activity   Assist Walk 50 feet with 2 turns activity did not occur: Safety/medical concerns  Walk 150 feet activity   Assist Walk 150 feet activity did not occur: Safety/medical concerns         Walk 10 feet on uneven surface  activity   Assist Walk 10 feet on uneven surfaces activity did not occur: Safety/medical concerns         Wheelchair     Assist Will patient use wheelchair at discharge?:  (likely will require dependent w/c transport)      Wheelchair assist level: Dependent - Patient 0%      Wheelchair 50  feet with 2 turns activity    Assist        Assist Level: Dependent - Patient 0%   Wheelchair 150 feet activity     Assist     Assist Level: Dependent - Patient 0%      Medical Problem List and Plan: 1. Diffuse weakness with hypertonicity and deficits with self-care, swallowing, cognition secondary to TBI with significant Psych history.  Continue CIR      2.  Antithrombotics: -DVT/anticoagulation:  Pharmaceutical: Lovenox             -antiplatelet therapy: N/a 3. 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   7/18 will try hydrocodone in place of tramadol for severe pain 4. Mood/agitation/hx of bipolar disorder: Neuropsych and psychiatry to follow during his stay. LCSW to follow for evaluation and support             -antipsychotic agents:  seroquel  -continue cymbalta, zoloft  -added gabapentin as above  -ativan BID and prn, no haldol!  -psych also following this patient  7/17 wife and family  now staying in his room each night as this seems to help the most with his anxiety and emotional lability.   7/18 will add klonopin to mix, may transition off of ativan completely if klonopin helps his extensor tone. 5. Neuropsych: This patient is not fully capable of making decisions on his own behalf. 6. Skin/Wound Care: Routine pressure relief measures.  7. Fluids/Electrolytes/Nutrition: Monitor I/Os.  11. Facial Fx/Maxillary fracture s/p repair with associated dysphagia: Wires apparently removed by ENT yesterday              Dysphagia 1 thin diet--advance per SLP--eating well     12. ABLA: Improving.    Hemoglobin 12.2 on 7/12  Continue to [facility] Monday 7/19 13. Prediabetes: Hyperglycemia due to tube feeds. Continue feeds at nights as intake remains variable.    Tightly controlled--dc cbg  checks/SSI                14.  Severe muscle spasms with extensor tone:    -no improvement really after stopping haldol   -MRI of head and neck do not indicate an injury which would lead to clinical presentation we're seeing. Consider f/u MRI this week   Baclofen increased to 10 3 times daily on 7/11 without much effect  7/16 Dantrium 25mg  TID--will try QID   7/17 continue bi-valve cast trials. Might do better with solid cast  7/18 continue bi-valve cast trial, consider solid cast tomorrow     -really no benefit with addition of dantrium--decrease to bid     -given his severe anxiety/perseveration, will begin trial of klonopin 0.5mg  TID.  15.  Tachycardia/elevated blood pressure  Continue propanolol which was increased to 40 3 times daily on 7/11 16.  Transaminitis  LFTs elevated on 7/10, continue to monitor      LOS: 9 days A FACE TO FACE EVALUATION WAS PERFORMED  Meredith Staggers 07/28/2019, 9:25 AM

## 2019-07-28 NOTE — Progress Notes (Signed)
Patient is complaining of increased pain and muscle tightness. Patient was crying upon entry, he stated the pain was unbearable. Patient complains of pain all over and states "I hurt so bad". Wife stated yesterday was the worst she has seen his muscle tone be. Nurse said she would leave note for doctor to address.

## 2019-07-29 ENCOUNTER — Inpatient Hospital Stay (HOSPITAL_COMMUNITY): Payer: Medicaid Other | Admitting: Speech Pathology

## 2019-07-29 ENCOUNTER — Inpatient Hospital Stay (HOSPITAL_COMMUNITY): Payer: Medicaid Other

## 2019-07-29 LAB — BASIC METABOLIC PANEL
Anion gap: 11 (ref 5–15)
BUN: 13 mg/dL (ref 6–20)
CO2: 23 mmol/L (ref 22–32)
Calcium: 9.2 mg/dL (ref 8.9–10.3)
Chloride: 105 mmol/L (ref 98–111)
Creatinine, Ser: 0.84 mg/dL (ref 0.61–1.24)
GFR calc Af Amer: >60 mL/min (ref >60)
GFR calc non Af Amer: >60 mL/min (ref >60)
Glucose, Bld: 97 mg/dL (ref 70–99)
Potassium: 3.5 mmol/L (ref 3.5–5.1)
Sodium: 139 mmol/L (ref 135–145)

## 2019-07-29 LAB — CBC
HCT: 40.1 % (ref 39.0–52.0)
Hemoglobin: 12.8 g/dL — ABNORMAL LOW (ref 13.0–17.0)
MCH: 28.7 pg (ref 26.0–34.0)
MCHC: 31.9 g/dL (ref 30.0–36.0)
MCV: 89.9 fL (ref 80.0–100.0)
Platelets: 418 10*3/uL — ABNORMAL HIGH (ref 150–400)
RBC: 4.46 MIL/uL (ref 4.22–5.81)
RDW: 13 % (ref 11.5–15.5)
WBC: 6.1 10*3/uL (ref 4.0–10.5)
nRBC: 0 % (ref 0.0–0.2)

## 2019-07-29 LAB — GLUCOSE, CAPILLARY
Glucose-Capillary: 85 mg/dL (ref 70–99)
Glucose-Capillary: 93 mg/dL (ref 70–99)
Glucose-Capillary: 133 mg/dL — ABNORMAL HIGH (ref 70–99)

## 2019-07-29 NOTE — Progress Notes (Signed)
Occupational Therapy Session Note  Patient Details  Name: Chase Caldwell MRN: [id number] Date of Birth: 08-13-1980  Today's Date: 07/29/2019 OT Individual Time: 1100-1144 OT Individual Time Calculation (min): 44 min    Short Term Goals: Week 1:  OT Short Term Goal 1 (Week 1): Pt will sitEOB with mod A in prep for functional ADL task OT Short Term Goal 1 - Progress (Week 1): Met OT Short Term Goal 2 (Week 1): Pt will self feed with AE with max A OT Short Term Goal 2 - Progress (Week 1): Met OT Short Term Goal 3 (Week 1): Pt will tolerate sitting upright out of bed for ~3 hrs OT Short Term Goal 3 - Progress (Week 1): Progressing toward goal OT Short Term Goal 4 (Week 1): Pt will roll to right and left with max A of one caregiver OT Short Term Goal 4 - Progress (Week 1): Progressing toward goal  Skilled Therapeutic Interventions/Progress Updates:    1:1. Pt received in bed agreeable to OT. Pt completes bridging hips with shoes and feet braced on bed for OT to advanced past hips. Pt completes supine>sitting EOB with +2 A and lateral scoot EOB>w/c<>EOM with MOD A +2 with VC for head hips relationhip. Pt completes lateral leans onto elbows in B directions with MIN A for sitting balance EOM with VC for forward flexion to improve deep propioceptive input into wrist, elbow and hand. Pt completes anterior weight shift pushing wheeled stool far forward for WB into hands, opening through trunk and WB into LEs onto floor to normalize tone. Exited session with pt seated in TIS at RN station with belt on hips and thighs, tilted back and hot pach in place. Retrieved 30 min later with skin in tact, no irritation noted and wife feeding pt lunch.  Therapy Documentation Precautions:  Precautions Precautions: Fall, Other (comment) Precaution Comments: PEG, significant hypertonia, right ankle PF contracture Required Braces or Orthoses: Other Brace Other Brace: abdominal binder (to keep PEG  safe) Restrictions Weight Bearing Restrictions: No General:   Vital Signs: Therapy Vitals Pulse Rate: (!) 110 Resp: 19 BP: (!) 135/100 Patient Position (if appropriate): Lying Oxygen Therapy SpO2: 98 % O2 Device: Room Air Pain:   ADL: ADL Eating: Dependent Where Assessed-Eating: Bed level Grooming: Dependent Where Assessed-Grooming: Wheelchair Upper Body Bathing: Dependent Where Assessed-Upper Body Bathing: Wheelchair Lower Body Bathing: Dependent Where Assessed-Lower Body Bathing: Edge of bed Upper Body Dressing: Dependent Where Assessed-Upper Body Dressing: Wheelchair Lower Body Dressing: Dependent Toileting: Dependent Toilet Transfer: Not assessed Social research officer, government: Not assessed Vision   Perception    Praxis   Exercises:   Other Treatments:     Therapy/Group: Individual Therapy  Tonny Branch 07/29/2019, 11:44 AM

## 2019-07-29 NOTE — Progress Notes (Signed)
Physical Therapy Session Note  Patient Details  Name: Chase Caldwell MRN: [id number] Date of Birth: [birth year]  Today's Date: 07/29/2019 PT Individual Time: 1445-1540 PT Individual Time Calculation (min): 55 min   Short Term Goals: Week 2:  PT Short Term Goal 1 (Week 2): Patient will perform supine to sit with max A +1 consistently. PT Short Term Goal 2 (Week 2): Patient will perform sit to supine with mod A +2. PT Short Term Goal 3 (Week 2): Patient will perform basic transfers with mod A +2. PT Short Term Goal 4 (Week 2): Patient will initiate power w/c mobility.  Skilled Therapeutic Interventions/Progress Updates:     Patient in [name] and the nurses station upon PT arrival. Patient alert and agreeable to PT session. Patient reported 10/10 buttock pain and intermittent "full body" pain with spasms and increased tone during session, RN made aware and provided pain medication during session. PT provided repositioning, rest breaks, and distraction as pain interventions throughout session.   Therapeutic Activity: Bed Mobility: Patient performed sit to supine with mod-max A +2 and scooting down and up in bed with total-max A +2. Provided verbal cues for leaning to his bottom elbow then brining LEs onto the bed for improved trunk control when lying down. Transfers: Patient performed a slide board transfer [name]>bed with mod-max A +2 and total A for board placement. Provided cues for hand placement in lap to prevent injury due to increased tone, sequencing/timing with 1-2-3 count for patient to push with LEs to lift hips, board placement, and head-hips relationship for proper technique and decreased assist with transfers.   Wheelchair Mobility:  Patient was transported in the [place] w/c with total A during session for energy conservation and time management.  Neuromuscular Re-ed: Patient performed the following UE and LE activities: Provided cues and manual facilitation for activation of  antagonist muscles to break up tone throughout session: -for L UE extensor tone cued patient to bring his hand to his chin or chest with facilitation at his forearm and elbow for elbow flexion and wrist for wrist extension -for LE flexor tone, provided pressure and hip and knee to promote knee extension, patient only able to tolerate LE extension for short periods due to increased PF tone; attempted to place patient's feet flat against the end of the bed for DF stretch with knee/hip extension, patient unable to tolerate and patient would revert back to flexor tone or position of comfort "butterfly" or hip abduction with knee flexion -for flexor tone of his trunk, cued patient to place his head back on his pillow with manual facilitation at his L shoulder due to increased R trunk flexion Attempted to bring patient to a fully extended position to apply [name] straps to trial standing in the bed, patient unable to maintain a safe position to apply straps affectively to trial standing today due to increased flexor tone  Manual Therapy: Performed R shoulder, B thigh, and B gastroc soft tissue mobilization to reduce patient's tone and improve positioning throughout session. Also performed B DF stretch 2x1 min on L and x1 and 5 min on R with knee flexed and pressure at patient's knee and forefoot to promote DF and posterior glide applied at the talus for improved joint mobility. Applied moist heat pack to R ankle during stretch for reduced PF tone  Patient in bed at end of session with breaks locked, 4 rails up and padded with pillows, and all needs within reach.    Therapy  Documentation Precautions:  Precautions Precautions: Fall, Other (comment) Precaution Comments: PEG, significant hypertonia, right ankle PF contracture Required Braces or Orthoses: Other Brace Other Brace: abdominal binder (to keep PEG safe) Restrictions Weight Bearing Restrictions: No    Therapy/Group: Individual  Therapy  Nameer Summer L Makenzie Weisner PT, DPT  07/29/2019, 5:43 PM

## 2019-07-29 NOTE — Progress Notes (Signed)
[facility] PHYSICAL MEDICINE & REHABILITATION PROGRESS NOTE  Subjective/Complaints: Patient seen sitting up in bed this morning.  He states he slept well overnight.  He slept fairly per comments by other patients.  He states he is thirsty.  ROS: Limited due to behavior   Objective: Vital Signs: Blood pressure (!) 135/100, pulse (!) 110, temperature 98.3 F (36.8 C), resp. rate 19, height 6' (1.829 m), weight 119 kg, SpO2 98 %. No results found. Recent Labs    07/29/19 0538  WBC 6.1  HGB 12.8*  HCT 40.1  PLT 418*   Recent Labs    07/29/19 0538  NA 139  K 3.5  CL 105  CO2 23  GLUCOSE 97  BUN 13  CREATININE 0.84  CALCIUM 9.2    Physical Exam: BP (!) 135/100 (BP Location: Right Arm)   Pulse (!) 110   Temp 98.3 F (36.8 C)   Resp 19   Ht 6' (1.829 m)   Wt 119 kg   SpO2 98%   BMI 35.58 kg/m  Constitutional: No distress . Vital signs reviewed. HENT: Normocephalic.  Atraumatic. Eyes: EOMI. No discharge. Cardiovascular: No JVD. Respiratory: Normal effort.  No stridor. GI: Non-distended. Skin: Warm and dry.  Intact. Psych: Normal mood.  Normal behavior. Musc: No edema in extremities.  No tenderness in extremities. Neuro: Alert Facial paralysis, unchanged Dysarthria, unchanged Vision remains limited Extensor tone L>R, unchanged Motor: Limited due to tone, however moving all extremities   Assessment/Plan: 1. 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  Physiatrist is providing close team supervision and 24 hour management of active medical problems listed below.  Physiatrist and rehab team continue to assess barriers to discharge/monitor patient progress toward functional and medical goals  Care Tool:  Bathing        Body parts bathed by helper: Right arm, Left arm, Chest, Abdomen, Front perineal area, Buttocks, Right upper  leg, Left upper leg, Right lower leg, Left lower leg, Face     Bathing assist Assist Level: 2 Helpers     Upper Body Dressing/Undressing Upper body dressing        Upper body assist Assist Level: Total Assistance - Patient < 25%    Lower Body Dressing/Undressing Lower body dressing      What is the patient wearing?: Incontinence brief, Underwear/pull up     Lower body assist Assist for lower body dressing: Total Assistance - Patient < 25%     Toileting Toileting    Toileting assist Assist for toileting: 2 Helpers     Transfers Chair/bed transfer  Transfers assist  Chair/bed transfer activity did not occur: Safety/medical concerns (safety/medical b/c we introduced new technique??)  Chair/bed transfer assist level: 2 Helpers     Locomotion Ambulation   Ambulation assist   Ambulation activity did not occur: Safety/medical concerns          Walk 10 feet activity   Assist  Walk 10 feet activity did not occur: Safety/medical concerns        Walk 50 feet activity   Assist Walk 50 feet with 2 turns activity did not occur: Safety/medical concerns         Walk 150 feet activity   Assist Walk 150 feet activity did not occur: Safety/medical concerns         Walk 10 feet on uneven surface  activity   Assist Walk 10 feet on  uneven surfaces activity did not occur: Safety/medical concerns         Wheelchair     Assist Will patient use wheelchair at discharge?:  (likely will require dependent w/c transport)      Wheelchair assist level: Dependent - Patient 0%      Wheelchair 50 feet with 2 turns activity    Assist        Assist Level: Dependent - Patient 0%   Wheelchair 150 feet activity     Assist     Assist Level: Dependent - Patient 0%      Medical Problem List and Plan: 1. Diffuse weakness with hypertonicity and deficits with self-care, swallowing, cognition secondary to TBI with significant Psych  history.  Continue CIR     2.  Antithrombotics: -DVT/anticoagulation:  Pharmaceutical: Lovenox             -antiplatelet therapy: N/a 3. 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   Hydrocodone in place of tramadol for severe pain  Appears controlled on 7/19 with meds 4. Mood/agitation/hx of bipolar disorder: Neuropsych and psychiatry to follow during his stay. LCSW to follow for evaluation and support             -antipsychotic agents:  seroquel  -continue cymbalta, zoloft  -added gabapentin as above  -ativan BID and prn, no haldol!  -psych also following this patient  Added klonopin 5. Neuropsych: This patient is not fully capable of making decisions on his own behalf. 6. Skin/Wound Care: Routine pressure relief measures.  7. Fluids/Electrolytes/Nutrition: Monitor I/Os.  11. Facial Fx/Maxillary fracture s/p repair with associated dysphagia: Wires removed by ENT             Dysphagia 1 thin diet--advance per SLP    12. ABLA: Improving.    Hemoglobin 12.8 on 7/19  Continue to monitor 13. Prediabetes: Hyperglycemia due to tube feeds. Continue feeds at nights as intake remains variable.    Dc cbg checks/SSI  14.  Severe muscle spasms with extensor tone:    -no improvement really after stopping haldol   -MRI of head and neck do not indicate an injury which would lead to clinical presentation we're seeing. Consider f/u MRI this week   Baclofen increased to 10 3 times daily on 7/11 without much effect  7/16 Dantrium 25mg  TID--will try QID, no benefit-decrease to twice daily   7/17 continue bi-valve cast trials. Might do better with solid cast  Trial of klonopin 0.5mg  TID.   15.  Tachycardia/elevated blood pressure  Continue propanolol which was increased to 40 3 times daily on 7/11  Labile/elevated diastolic pressures on 4/23-NTIRWER  for trend 16.  Transaminitis  LFTs elevated on 7/10, continue to monitor  LOS: 10 days A FACE TO FACE EVALUATION WAS PERFORMED  Kennidy Lamke Lorie Phenix 07/29/2019, 12:54 PM
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
## 2019-07-29 NOTE — Progress Notes (Addendum)
Speech Language Pathology Daily Session Note  Patient Details  Name: Chase Caldwell MRN: [id number] Date of Birth: 07/01/1980  Today's Date: 07/29/2019 SLP Individual Time: 1000-1025 SLP Individual Time Calculation (min): 25 min and Today's Date: 07/29/2019 SLP Missed Time: 20 Minutes Missed Time Reason: Pain  Short Term Goals: Week 2: SLP Short Term Goal 1 (Week 2): Pt will consume dys 1 textures and thin liquids with minimal s/s aspriation and Min A verbal cues for use of swallow strategies. SLP Short Term Goal 2 (Week 2): Patient will recall new, daily information with supervision verbal cues. SLP Short Term Goal 3 (Week 2): Patient will utilize speech intelligibility strategies at the sentence level with Min A verbal cues to achieve ~80% intelligibility. SLP Short Term Goal 4 (Week 2): Patient will recall new, daily information with supervision verbal cues.  Skilled Therapeutic Interventions: Skilled treatment session focused on speech goals. Upon arrival, patient was yelling out in pain in his bed. SLP repositioned patient in attempts to ease pain. Patient appeared more verbally frustrated today due to not being comfortable and feeling turned in bed. SLP provided reassurance that he was in a straight line but patient perseverated on several reasons why he was uncomfortable in bed despite SLPs efforts. Mod A verbal cues were needed for use of speech intelligibility strategies at the phrase and sentence level to maximize intelligibility to ~80%. Patient requested to have his legs crossed and bent and patient became frustrated when SLP attempted to educate patient on not sitting in that position. Patient reported, "fine, my wife will, eat it and get out." Patient missed remaining 20 minutes of session. Continue with current plan of care.      Pain Yelling out in pain, patient pre-medicated and repositioned throughout the session    Therapy/Group: Individual Therapy  Chase Caldwell 07/29/2019,  12:26 PM

## 2019-07-29 NOTE — Progress Notes (Signed)
Occupational Therapy Session Note  Patient Details  Name: Chase Caldwell MRN: [id number] Date of Birth: 1980-05-17  Today's Date: 07/29/2019 OT Individual Time: 2035-5974 OT Individual Time Calculation (min): 55 min    Short Term Goals: Week 2:  OT Short Term Goal 1 (Week 2): Pt will roll in B directions with MAX A of 1 caregive to reduce BOC OT Short Term Goal 2 (Week 2): Pt will bridge hips with MIN A of 1 caregiver during ADL to reduce BOC OT Short Term Goal 3 (Week 2): Pt will sit EOB/EOM with MIN A of 1 OT Short Term Goal 4 (Week 2): Pt will complete transfer to TIS with MAX A of 1 in prep for OOB toileting  Skilled Therapeutic Interventions/Progress Updates:    Pt received supine with no immediate c/o pain. Pt with hypertonicity in every joint, frequent involuntary movements. Pt being fed by NT, passed off to OT. Attempted HOH for pt to self feed, pt requiring max HOH to bring LUE to his mouth. Pt washed his face with max stabilzation of his LUE and pt moving his head back and forth on static hand with washcloth. Throughout session pt fully ranged within limits to maintain muscle elasticity and reduce (very high) contracture risk. Pt reports PROM feels very good on all joints. Pt completed bed mobility to EOB with mod +2 assist. Once EOB pt required fluctuating min-CGA for sitting balance. Total A for oral care and hair care EOB. Pt returned to supine and with heavy LE support to brace, was able to push himself up in bed with bed in trend position. Pt was then able to alternate hip flexion/extension with OT support/lifting each foot and with heavy cueing. Pt was left supine with several pillows positioned to reduce potential areas of skin breakdown.   Therapy Documentation Precautions:  Precautions Precautions: Fall, Other (comment) Precaution Comments: PEG, significant hypertonia, right ankle PF contracture Required Braces or Orthoses: Other Brace Other Brace: abdominal binder (to keep  PEG safe) Restrictions Weight Bearing Restrictions: No  Therapy/Group: Individual Therapy  [name] 07/29/2019, 6:52 AM

## 2019-07-30 ENCOUNTER — Inpatient Hospital Stay (HOSPITAL_COMMUNITY): Payer: Medicaid Other

## 2019-07-30 ENCOUNTER — Inpatient Hospital Stay (HOSPITAL_COMMUNITY): Payer: Medicaid Other | Admitting: Speech Pathology

## 2019-07-30 LAB — URINALYSIS, COMPLETE (UACMP) WITH MICROSCOPIC
Bilirubin Urine: NEGATIVE
Glucose, UA: NEGATIVE mg/dL
Hgb urine dipstick: NEGATIVE
Ketones, ur: NEGATIVE mg/dL
Leukocytes,Ua: NEGATIVE
Nitrite: NEGATIVE
Protein, ur: NEGATIVE mg/dL
Specific Gravity, Urine: 1.009 (ref 1.005–1.030)
pH: 6 (ref 5.0–8.0)

## 2019-07-30 MED ORDER — QUETIAPINE FUMARATE 200 MG PO TABS
200.0000 mg | ORAL_TABLET | Freq: Every day | ORAL | Status: DC
  Administered 2019-07-30 – 2019-08-16 (×18): 200 mg
  Filled 2019-07-30 (×18): qty 1

## 2019-07-30 MED ORDER — GABAPENTIN 250 MG/5ML PO SOLN
100.0000 mg | Freq: Every day | ORAL | Status: DC
  Administered 2019-07-31 – 2019-08-16 (×17): 100 mg via ORAL
  Filled 2019-07-30 (×18): qty 2

## 2019-07-30 MED ORDER — LORAZEPAM 0.5 MG PO TABS
0.5000 mg | ORAL_TABLET | Freq: Two times a day (BID) | ORAL | Status: DC | PRN
  Administered 2019-08-04 – 2019-08-16 (×3): 0.5 mg
  Filled 2019-07-30 (×3): qty 1

## 2019-07-30 MED ORDER — CLONAZEPAM 0.5 MG PO TABS
1.0000 mg | ORAL_TABLET | Freq: Three times a day (TID) | ORAL | Status: DC
  Administered 2019-07-30 – 2019-08-02 (×9): 1 mg via ORAL
  Filled 2019-07-30 (×9): qty 2

## 2019-07-30 NOTE — Progress Notes (Signed)
[facility] PHYSICAL MEDICINE & REHABILITATION PROGRESS NOTE  Subjective/Complaints: Still crying out a lot over night but pt/staff feel that pain/spasms a little better. Up with OT this morning. Looks groggy.   ROS: Limited due to cognitive/behavioral    Objective: Vital Signs: Blood pressure (!) 144/110, pulse 99, temperature 99.2 F (37.3 C), resp. rate 16, height 6' (1.829 m), weight 119 kg, SpO2 97 %. No results found. Recent Labs    07/29/19 0538  WBC 6.1  HGB 12.8*  HCT 40.1  PLT 418*   Recent Labs    07/29/19 0538  NA 139  K 3.5  CL 105  CO2 23  GLUCOSE 97  BUN 13  CREATININE 0.84  CALCIUM 9.2    Physical Exam: BP (!) 144/110 (BP Location: Right Arm)   Pulse 99   Temp 99.2 F (37.3 C)   Resp 16   Ht 6' (1.829 m)   Wt 119 kg   SpO2 97%   BMI 35.58 kg/m  Constitutional: No distress . Vital signs reviewed. HEENT: EOMI, oral membranes moist Neck: supple Cardiovascular: RRR without murmur. No JVD    Respiratory/Chest: CTA Bilaterally without wheezes or rales. Normal effort    GI/Abdomen: BS +, non-tender, non-distended Ext: no clubbing, cyanosis, or edema Psych: flat, cooperative Musc: No edema in extremities.  No tenderness in extremities. Neuro: groggy, speech dysarthric Facial paralysis, unchanged Vision remains limited Extensor tone L>R. Seems to be able to actively move against tone better today Motor: Limited due to tone, however moving all extremities   Assessment/Plan: 1. 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  Physiatrist is providing close team supervision and 24 hour management of active medical problems listed below.  Physiatrist and rehab team continue to assess barriers to discharge/monitor patient progress toward functional and medical goals  Care Tool:  Bathing        Body parts bathed by  helper: Right arm, Left arm, Chest, Abdomen, Front perineal area, Buttocks, Right upper leg, Left upper leg, Right lower leg, Left lower leg, Face     Bathing assist Assist Level: 2 Helpers     Upper Body Dressing/Undressing Upper body dressing        Upper body assist Assist Level: Total Assistance - Patient < 25%    Lower Body Dressing/Undressing Lower body dressing      What is the patient wearing?: Incontinence brief, Underwear/pull up     Lower body assist Assist for lower body dressing: Total Assistance - Patient < 25%     Toileting Toileting    Toileting assist Assist for toileting: 2 Helpers     Transfers Chair/bed transfer  Transfers assist  Chair/bed transfer activity did not occur: Safety/medical concerns (safety/medical b/c we introduced new technique??)  Chair/bed transfer assist level: 2 Helpers     Locomotion Ambulation   Ambulation assist   Ambulation activity did not occur: Safety/medical concerns          Walk 10 feet activity   Assist  Walk 10 feet activity did not occur: Safety/medical concerns        Walk 50 feet activity   Assist Walk 50 feet with 2 turns activity did not occur: Safety/medical concerns         Walk 150 feet activity   Assist Walk 150 feet activity did not occur: Safety/medical concerns         Walk 10  feet on uneven surface  activity   Assist Walk 10 feet on uneven surfaces activity did not occur: Safety/medical concerns         Wheelchair     Assist Will patient use wheelchair at discharge?:  (likely will require dependent w/c transport)      Wheelchair assist level: Dependent - Patient 0%      Wheelchair 50 feet with 2 turns activity    Assist        Assist Level: Dependent - Patient 0%   Wheelchair 150 feet activity     Assist     Assist Level: Dependent - Patient 0%      Medical Problem List and Plan: 1. Diffuse weakness with hypertonicity and  deficits with self-care, swallowing, cognition secondary to TBI with significant Psych history.  Continue CIR     2.  Antithrombotics: -DVT/anticoagulation:  Pharmaceutical: Lovenox             -antiplatelet therapy: N/a 3. 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   Hydrocodone in place of tramadol for severe pain  Rx tone as below 4. Mood/agitation/hx of bipolar disorder: Neuropsych and psychiatry to follow during his stay. LCSW to follow for evaluation and support             -antipsychotic agents:  seroquel  -continue cymbalta, zoloft  -added gabapentin as above  -ativan BID and prn, no haldol!  -psych also following this patient  -titrate klonopin upward, reduce ativan sl 5. Neuropsych: This patient is not fully capable of making decisions on his own behalf. 6. Skin/Wound Care: Routine pressure relief measures.  7. Fluids/Electrolytes/Nutrition: Monitor I/Os.  11. Facial Fx/Maxillary fracture s/p repair with associated dysphagia: Wires removed by ENT             Dysphagia 1 thin diet--advance per SLP    12. ABLA: Improving.    Hemoglobin 12.8 on 7/19  Continue to monitor 13. Prediabetes: Hyperglycemia due to tube feeds. Continue feeds at nights as intake remains variable.    Dc cbg checks/SSI  14.  Severe muscle spasms with extensor tone:    -no improvement really after stopping haldol   -MRI of head and neck do not indicate an injury which would lead to clinical presentation we're seeing. Consider f/u MRI this week   Baclofen increased to 10 3 times daily on 7/11 without much effect  7/16 Dantrium 25mg  TID--will try QID, no benefit-decrease to twice daily   7/20--will put solid cast on patient Wednesday per OT   -dc dantrium   -will order repeat MRI to look for potential source of severe extensor tone 15.   Tachycardia/elevated blood pressure  Continue propanolol which was increased to 40 3 times daily on 7/11  Bp/HR still elevated.   -will increase klonopin for tone/anxiety   -treating pain 16.  Transaminitis  LFTs elevated on 7/10, continue to monitor  LOS: 11 days A FACE TO FACE EVALUATION WAS PERFORMED  Meredith Staggers 07/30/2019, 11:07 AM

## 2019-07-30 NOTE — Progress Notes (Signed)
Speech Language Pathology Daily Session Note  Patient Details  Name: Chase Caldwell MRN: [id number] Date of Birth: Sep 21, 1980  Today's Date: 07/30/2019 SLP Individual Time: 1300-1345 SLP Individual Time Calculation (min): 45 min  Short Term Goals: Week 2: SLP Short Term Goal 1 (Week 2): Pt will consume dys 1 textures and thin liquids with minimal s/s aspriation and Min A verbal cues for use of swallow strategies. SLP Short Term Goal 2 (Week 2): Patient will recall new, daily information with supervision verbal cues. SLP Short Term Goal 3 (Week 2): Patient will utilize speech intelligibility strategies at the sentence level with Min A verbal cues to achieve ~80% intelligibility. SLP Short Term Goal 4 (Week 2): Patient will recall new, daily information with supervision verbal cues.  Skilled Therapeutic Interventions: Skilled treatment session focused on dysphagia and speech goals. SLP facilitated session by providing skilled observation with lunch meal of Dys. 1 textures with thin liquids. Patient consumed meal without overt s/s of aspiration and appeared to demonstrate a more timely AP transit of the bolus with a quicker swallow initiation. Recommend patient continue current diet. Min-Mod A verbal cues were also needed for use of speech intelligibility strategies at the sentence level to achieve ~80% intelligibility. Patient left upright in bed with alarm on and all needs within reach. Continue with current plan of care.      Pain No/Denies Pain   Therapy/Group: Individual Therapy  Aminta Sakurai 07/30/2019, 3:45 PM

## 2019-07-30 NOTE — Patient Care Conference (Signed)
Inpatient RehabilitationTeam Conference and Plan of Care Update Date: 07/30/2019   Time: 1:44 PM    Patient Name: Chase Caldwell      Medical Record Number: [id number]  Date of Birth: 1980-06-24 Sex: Male         Room/Bed: 4W07C/4W07C-01 Payor Info: Payor: MEDICAID [id number] / Plan: MEDICAID  ACCESS / Product Type: *No Product type* /    Admit Date/Time:  07/19/2019  3:13 PM  Primary Diagnosis:  TBI (traumatic brain injury) [facility])  Hospital Problems: Principal Problem:   TBI (traumatic brain injury) ([place]) Active Problems:   Transaminitis   Tachycardia   Elevated blood pressure reading   Muscle spasm   Chronic pain syndrome    Expected Discharge Date: Expected Discharge Date: 08/17/19  Team Members Present: Physician leading conference: Dr. Alger Simons Care Coodinator Present: Loralee Pacas, LCSWA;Clemens Lachman Creig Hines, RN, BSN, [place] Nurse Present: Rayne Du, LPN PT Present: Apolinar Junes, PT OT Present: Laverle Hobby, OT SLP Present: Weston Anna, SLP PPS Coordinator present : Gunnar Fusi, SLP     Current Status/Progress Goal Weekly Team Focus  Bowel/Bladder   Incontinent    prevent skin breakdown  Assess every shift and as needed. assess toiletting needs answer  call light promtly   Swallow/Nutrition/ Hydration   Dys. 1 textures with thin liquids, Min A  Supervision  tolerance of current diet, use of swallowing compensatory strategies   ADL's   Mod-max A +2 for transfers, max-total A for ADLs at bed level. Hypertonicity in all joints with spasms constantly  mod A overall of one caregiver  Self feeding, ADL retraining, ADL transfers, tone management, contracture prevention, cognitive retraining   Mobility   Fluctuates based on tone/spasms from mod-max +2 to max A of 1 person for bed mobility and slide board transfers, max +2-3 to stand due to PF tone  Mod A overall w/c level  UE and LE tone managment, pain interventions, functional mobility, sitting balance,  standing balance and weight bearing for PF tone, power w/c assessment with [name], activity tolerance, patient/caregiver education   Communication   Mod A     use of speech intelligibility strategies   Safety/Cognition/ Behavioral Observations  Min A  Supervision  recall of functional information   Pain   chronic back pain 10/10at mid night. Sceduled and prn meds given per [facility].   Pain <5  Assess every shift and as needed help patient turn evert two hours.   Skin   Bruising to left abdomen  prevent skin breakdown   Assessevery shift and as neede relief presure  at bony prominences     Team Discussion:  Discharge Planning/Teaching Needs:  24/7 care from wife and his parents who will return from [place] to assist with providing care.  Family education as recommended   Current Update:  None  Current Barriers to Discharge:  Incontinence and Behavior  Possible Resolutions to Barriers: Timed toileting schedule, pain and tone control to assist with behavior control.  Patient on target to meet rehab goals: yes, Incontinent of B/B, anxiety and pain, MD adjusting medications. Mod/mas assist +2 with slideboard. Plan is to full serial casting to lower extremity to help with tone.  *See Care Plan and progress notes for long and short-term goals.   Revisions to Treatment Plan:  MRI of head later in the week to assess for any changes.    Medical Summary Current Status: ongoing extensor tone, anxiety, serial casting this week Weekly Focus/Goal: improve tone, mood stabilization, nutrition  Barriers to  Discharge: Behavior;Medical stability   Possible Resolutions to Barriers: pain mgt, tone control   Continued Need for Acute Rehabilitation Level of Care: The patient requires daily medical management by a physician with specialized training in physical medicine and rehabilitation for the following reasons: Direction of a multidisciplinary physical rehabilitation program to maximize functional  independence : Yes Medical management of patient stability for increased activity during participation in an intensive rehabilitation regime.: Yes Analysis of laboratory values and/or radiology reports with any subsequent need for medication adjustment and/or medical intervention. : Yes   I attest that I was present, lead the team conference, and concur with the assessment and plan of the team.   Cristi Loron 07/30/2019, 1:44 PM

## 2019-07-30 NOTE — Progress Notes (Signed)
Occupational Therapy Session Note  Patient Details  Name: Angas Isabell MRN: [id number] Date of Birth: [birth year]  Today's Date: 07/30/2019 OT Individual Time: 2595-6387 OT Individual Time Calculation (min): 51 min    Short Term Goals: Week 2:  OT Short Term Goal 1 (Week 2): Pt will roll in B directions with MAX A of 1 caregive to reduce BOC OT Short Term Goal 2 (Week 2): Pt will bridge hips with MIN A of 1 caregiver during ADL to reduce BOC OT Short Term Goal 3 (Week 2): Pt will sit EOB/EOM with MIN A of 1 OT Short Term Goal 4 (Week 2): Pt will complete transfer to TIS with MAX A of 1 in prep for OOB toileting  Skilled Therapeutic Interventions/Progress Updates:    Pt received supine, spasming in BUE and BLE, flexor pattern. [name] roll assist featured used to position pt in sidelying while awaiting +2 assistance, to assess any potential reduction of hypertonicity and increased voluntary control. Pt unable to tolerate L arm being under his body in sidelying for more than 2 minutes and was returned to supine with max A. Pt transitioned to EOB with max +2 assist. Pt more lethargic this session and requiring frequent cueing for arousal. Pt required fluctuating mod-CGA for sitting balance with posterior bias. Pt completed slideboard transfer with dependent board placement, max A +2. Dependent face washing performed. Pt able to swish mouthwash with mod cueing. Extra time spent ensuring proper alignment and positioning of pt in the [name]. Pt was left sitting up in the TIS at the nurses desk.   Therapy Documentation Precautions:  Precautions Precautions: Fall, Other (comment) Precaution Comments: PEG, significant hypertonia, right ankle PF contracture Required Braces or Orthoses: Other Brace Other Brace: abdominal binder (to keep PEG safe) Restrictions Weight Bearing Restrictions: No  Therapy/Group: Individual Therapy  [name] 07/30/2019, 6:39 AM

## 2019-07-30 NOTE — Progress Notes (Signed)
Physical Therapy Session Note  Patient Details  Name: Chase Caldwell MRN: [id number] Date of Birth: 1980-09-10  Today's Date: 07/30/2019 PT Individual Time: 1030-1120, 1150-1205, and 3500-9381 PT Individual Time Calculation (min): 50 min, 15 min, and 40 min   Short Term Goals: Week 2:  PT Short Term Goal 1 (Week 2): Patient will perform supine to sit with max A +1 consistently. PT Short Term Goal 2 (Week 2): Patient will perform sit to supine with mod A +2. PT Short Term Goal 3 (Week 2): Patient will perform basic transfers with mod A +2. PT Short Term Goal 4 (Week 2): Patient will initiate power w/c mobility.  Skilled Therapeutic Interventions/Progress Updates:     Session 1 and 2: Patient in at nurses station in [facility] w/c upon PT arrival. Patient alert and agreeable to PT session. Patient reported 9/10 R hip pain during session, RN made aware. PT provided repositioning, rest breaks, and distraction as pain interventions throughout session.   Chase Caldwell, [name] from [facility] present for w/c assessment for improved sitting tolerance and to progress independence with ADLs, IADLs, and locomotion using power mobility. Remained throughout 30 min of session to perform assessment/evaluation.  Per assessment, patient to receive a loner power chair from [employer] with the following features: -front wheel drive for improved shock absorption due to patient's systemic hypertonicity and spasticity   -lateral and front knee blocks for reduced LE tone -TIS and recline feature for pressure relief and positioning for tone managment -elevating seat to promote progress with ADLs and IADLs -chest and lap straps for safety and positioning -contoured back rest to reduce trunk displacement -adjustable foot plates to accommodate PF tone -elevating leg rest for positioning and tone managment -potential for standing feature for improved access with IADLs and weight bearing for improved bone density and management of  hypertonicity with significant PF in B LEs -potential for vision assist software due to patient having B vision loss (also will discuss options to simulate this if software not available with loner chair)  Therapeutic Activity: Bed Mobility: Patient performed supine to/from sit and rolling to/from prone on a mat table and sit to supine on the bed with mod-max A +2 and a third person assist for positioning into prone. Provided verbal cues for performing supine<>side-lying for improved trunk and LE control, and sequencing throughout. Placed small red wedge beneath patient's chest with a pillow for his head and 2 pillows under his pelvis in prone position for comfort and reduced tone.  Patient required min A-CGA for sitting balance EOM and EOB this session with cues for erect posture due to increased forward trunk flexion.   With patient in prone, performed static DF stretch to R ankle 2x2 min with intermittent quad stretch 3 x30 sec with stabilization at the hip. Performed on L DF and quad stretch 3x30 sec  Transfers: Patient performed slide board transfer [name]<>mat table with mod-max A +2 and total A for board placement. Provided cues for hand placement in lap to reduce risk of injury due to UE tone, board placement, and head-hips relationship for proper technique and decreased assist with transfers. Facilitated foot placement throughout.   Patient fell asleep in prone position on the mat table x30 min without any muscle spasms or pain. PT provided supervision during documentation time while patient slept. Then woke patient for second session and assisted him back to bed in his room, see details above, for nursing to change his incontinence brief due to bladder incontinence and then  transport to MRI.  Patient in bed at end of session, RN made aware, with breaks locked and all needs within reach.   Session 3: Patient in bed asleep upon PT arrival. Patient easily aroused to tactile and verbal  stimulation and agreeable to PT session. Patient reported unrated L UE and R LE pain with spasms during session, RN made aware. PT provided repositioning, rest breaks, and distraction as pain interventions throughout session.   Focused session on standing transfers and tolerance to improved LE strength, balance, and reduce LE tone.   Donned/doffed B tennis shoes in supine prior to/following mobility for improved foot positioning and traction in standing.  Therapeutic Activity: Bed Mobility: Patient performed supine to/from sit with max A +2. Provided verbal cues as above. Required total A +2 to scoot up in bed x2.  Transfers: Patient performed sit to/from stand x3 from EOB with max A +2 and manual facilitation from a third person for R foot positioning and promotion of DF in standing. Provided verbal cues and manual facilitaiton for forward weight shift, hip, trunk, and knee extension, and sending hips back onto the bed for improved positioning sitting EOB. Patient tolerated standing 10-20 sec each trial.  Patient in bed at end of session with breaks locked, pillows covering rails for UE protection, and all needs within reach.     Therapy Documentation Precautions:  Precautions Precautions: Fall, Other (comment) Precaution Comments: PEG, significant hypertonia, right ankle PF contracture Required Braces or Orthoses: Other Brace Other Brace: abdominal binder (to keep PEG safe) Restrictions Weight Bearing Restrictions: No    Therapy/Group: Individual Therapy  Chase Caldwell L Chase Caldwell PT, DPT  07/30/2019, 5:15 PM

## 2019-07-31 ENCOUNTER — Inpatient Hospital Stay (HOSPITAL_COMMUNITY): Payer: Medicaid Other

## 2019-07-31 ENCOUNTER — Inpatient Hospital Stay (HOSPITAL_COMMUNITY): Payer: Medicaid Other | Admitting: Speech Pathology

## 2019-07-31 DIAGNOSIS — I639 Cerebral infarction, unspecified: Secondary | ICD-10-CM

## 2019-07-31 MED ORDER — LISINOPRIL 10 MG PO TABS
10.0000 mg | ORAL_TABLET | Freq: Every day | ORAL | Status: DC
  Administered 2019-07-31 – 2019-08-17 (×18): 10 mg via ORAL
  Filled 2019-07-31 (×18): qty 1

## 2019-07-31 MED ORDER — LORAZEPAM 0.5 MG PO TABS: 0.5000 mg | ORAL_TABLET | Freq: Two times a day (BID) | ORAL | Status: DC

## 2019-07-31 NOTE — Progress Notes (Signed)
Discussed MRI results with Neurology who felt that areas of diffusion restriction due to anoxia not stroke.

## 2019-07-31 NOTE — Progress Notes (Signed)
Patient ID: Chase Caldwell, male   DOB: 1980-05-16, 39 y.o.   MRN: [id number]  SW spoke with pt wife Chase Caldwell [phone]) to provide updates from team conference, and d/c date 8/7. SW discussed family education and having multiple sessions. States that his parents will be flying in this evening. Plans for family edu to begin on late next week. SW to continue to provide updates.   Loralee Pacas, MSW, [place] Office: [phone] Cell: [phone] Fax: [phone]

## 2019-07-31 NOTE — Progress Notes (Signed)
Physical Therapy Session Note  Patient Details  Name: Chase Caldwell MRN: [id number] Date of Birth: 07-21-1980  Today's Date: 07/31/2019 PT Individual Time: 0800-0900 PT Individual Time Calculation (min): 60 min   Short Term Goals: Week 2:  PT Short Term Goal 1 (Week 2): Patient will perform supine to sit with max A +1 consistently. PT Short Term Goal 2 (Week 2): Patient will perform sit to supine with mod A +2. PT Short Term Goal 3 (Week 2): Patient will perform basic transfers with mod A +2. PT Short Term Goal 4 (Week 2): Patient will initiate power w/c mobility.  Skilled Therapeutic Interventions/Progress Updates:     Patient in bed upon PT arrival. Patient alert and conversational, stated "today is going to be an awesome day," and agreeable to PT session. Patient reported mild-moderate L wrist and R ankle pain with increased tone during session, RN made aware. PT provided repositioning, rest breaks, and distraction as pain interventions throughout session.   Focused session on increasing standing tolerance and weight bearing with use of [name] standing feature to decreased R DF tone and stretch B gastrocs. Donned patient's shorts bed level with max A with patient lifting LEs to allow PT to thread his feet through. Patient incontinent of urine. Performed rolling R/L x2 with mod-max A +2 facilitating and cuing for log roll technique as PT doffed/donned incontinence brief, performed peri-care, and pulled up shorts with total A. Donned B socks and shoes with patient in bed with total A in preparation for standing. Patient with improved tone this morning demonstrating ability to maintain LE extension with minimal spasms today. Continued to have intermittent L UE extensor tone with slightly decreased frequency this session. Applied bed straps over patient's chest, hips, and thighs and slowly progressed patient from 0 degrees to 74 degrees tilted into standing using the [name]. Provided slow  progressing with 30 sec trials every 10 degrees. Required total A for PT to facilitate positioning of R ankle to prevent inversion and promote DF with posterior glide to talus for improved joint mobility with stretch. Patient tolerated 2 min at 74 degrees before asking to lower down due to increased R ankle pain.   Patient performed supine>sit with mod A of 1 person with cues for rolling to side-lying before sitting for increased UE use to push to sit up. Patient sat with close supervision EOB ~4 min with cues for maintaining midline due to mild posterior and R lean. Patient performed a squat pivot transfer bed>[name] with mod A +2. Provided cues for hand placement and pushing with L UE, timing, and head-hips relationship to lift hips during transfer, and provided manual facilitation for foot placement for proper technique and decreased assist with transfers.   Patient in [place] w/c and placed at the nurses station at end of session with breaks locked and all needs within reach. Gait belts placed over patient's lap and thighs, and foot rests adjusted to improve patient's positioning and increase safety in sitting in [name].    Therapy Documentation Precautions:  Precautions Precautions: Fall, Other (comment) Precaution Comments: PEG, significant hypertonia, right ankle PF contracture Required Braces or Orthoses: Other Brace Other Brace: abdominal binder (to keep PEG safe) Restrictions Weight Bearing Restrictions: No    Therapy/Group: Individual Therapy  Quita Mcgrory L Nusayba Cadenas PT, DPT  07/31/2019, 6:37 PM

## 2019-07-31 NOTE — Progress Notes (Signed)
Occupational Therapy Session Note  Patient Details  Name: Chase Caldwell MRN: [id number] Date of Birth: 06/07/1980  Today's Date: 07/31/2019 OT Individual Time: 1500-1530 OT Individual Time Calculation (min): 30 min  and Today's Date: 07/31/2019 OT Missed Time: 30 Minutes Missed Time Reason: Patient fatigue (unarousable)   Short Term Goals: Week 2:  OT Short Term Goal 1 (Week 2): Pt will roll in B directions with MAX A of 1 caregive to reduce BOC OT Short Term Goal 2 (Week 2): Pt will bridge hips with MIN A of 1 caregiver during ADL to reduce BOC OT Short Term Goal 3 (Week 2): Pt will sit EOB/EOM with MIN A of 1 OT Short Term Goal 4 (Week 2): Pt will complete transfer to TIS with MAX A of 1 in prep for OOB toileting  Skilled Therapeutic Interventions/Progress Updates:    1:1. Pt received in bed audibly snoring only able to maintain arousal ~5 seconds at a time.. Unable to support pain d/t decreased arousal. Pt with significantly decreased tone/spasms this date, however unable to complete anything functional d/t lethargy. Pt audibly snoring as OT provides PROM of BUE in all planes, B ankles (more time spent on R>L), and cervical PROM to decrease tone and reduce risk of contracture. Pt asleep entire session and missed 30 min at end of session d/t unable to arouse.  Therapy Documentation Precautions:  Precautions Precautions: Fall, Other (comment) Precaution Comments: PEG, significant hypertonia, right ankle PF contracture Required Braces or Orthoses: Other Brace Other Brace: abdominal binder (to keep PEG safe) Restrictions Weight Bearing Restrictions: No General: General OT Amount of Missed Time: 30 Minutes Vital Signs: Therapy Vitals Temp: 98.6 F (37 C) Temp Source: Oral Pulse Rate: 100 Resp: 17 BP: (!) 155/109 Patient Position (if appropriate): Lying Oxygen Therapy SpO2: 97 % O2 Device: Room Air Pain:   ADL: ADL Eating: Dependent Where Assessed-Eating: Bed  level Grooming: Dependent Where Assessed-Grooming: Wheelchair Upper Body Bathing: Dependent Where Assessed-Upper Body Bathing: Wheelchair Lower Body Bathing: Dependent Where Assessed-Lower Body Bathing: Edge of bed Upper Body Dressing: Dependent Where Assessed-Upper Body Dressing: Wheelchair Lower Body Dressing: Dependent Toileting: Dependent Toilet Transfer: Not assessed Social research officer, government: Not assessed Vision   Perception    Praxis   Exercises:   Other Treatments:     Therapy/Group: Individual Therapy  Tonny Branch 07/31/2019, 4:16 PM
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
## 2019-07-31 NOTE — Progress Notes (Signed)
Speech Language Pathology Daily Session Note  Patient Details  Name: Derwin Reddy MRN: [id number] Date of Birth: [birth year]  Today's Date: 07/31/2019 SLP Individual Time: 3825-0539 SLP Individual Time Calculation (min): 40 min  Short Term Goals: Week 2: SLP Short Term Goal 1 (Week 2): Pt will consume dys 1 textures and thin liquids with minimal s/s aspriation and Min A verbal cues for use of swallow strategies. SLP Short Term Goal 2 (Week 2): Patient will recall new, daily information with supervision verbal cues. SLP Short Term Goal 3 (Week 2): Patient will utilize speech intelligibility strategies at the sentence level with Min A verbal cues to achieve ~80% intelligibility. SLP Short Term Goal 4 (Week 2): Patient will recall new, daily information with supervision verbal cues.  Skilled Therapeutic Interventions: Skilled treatment session focused on dysphagia and speech goals. SLP facilitated session by providing Mod A verbal cues for arousal and extra time for verbal responses due to lethargy. Patient also demonstrated decreased speech intelligibility this session due to lethargy. Patient began to consume his breakfast meal of Dys. 1 textures with thin liquids without overt s/s of aspiration but then reported nausea. RN made aware and patient transferred back to bed due to nausea and lethargy. Patient left with RN present. Continue with current plan of care.      Pain No/Denies Pain   Therapy/Group: Individual Therapy  Sevana Grandinetti 07/31/2019, 12:40 PM

## 2019-08-01 ENCOUNTER — Inpatient Hospital Stay (HOSPITAL_COMMUNITY): Payer: Medicaid Other

## 2019-08-01 LAB — VITAMIN B12: Vitamin B-12: 378 pg/mL (ref 180–914)

## 2019-08-01 LAB — COMPREHENSIVE METABOLIC PANEL
ALT: 98 U/L — ABNORMAL HIGH (ref 0–44)
AST: 33 U/L (ref 15–41)
Albumin: 3.1 g/dL — ABNORMAL LOW (ref 3.5–5.0)
Alkaline Phosphatase: 95 U/L (ref 38–126)
Anion gap: 8 (ref 5–15)
BUN: 9 mg/dL (ref 6–20)
CO2: 25 mmol/L (ref 22–32)
Calcium: 9.1 mg/dL (ref 8.9–10.3)
Chloride: 108 mmol/L (ref 98–111)
Creatinine, Ser: 0.7 mg/dL (ref 0.61–1.24)
GFR calc Af Amer: >60 mL/min (ref >60)
GFR calc non Af Amer: >60 mL/min (ref >60)
Glucose, Bld: 107 mg/dL — ABNORMAL HIGH (ref 70–99)
Potassium: 3.3 mmol/L — ABNORMAL LOW (ref 3.5–5.1)
Sodium: 141 mmol/L (ref 135–145)
Total Bilirubin: 0.7 mg/dL (ref 0.3–1.2)
Total Protein: 6.5 g/dL (ref 6.5–8.1)

## 2019-08-01 LAB — FOLATE: Folate: 10.5 ng/mL (ref >5.9)

## 2019-08-01 MED ORDER — POTASSIUM CHLORIDE CRYS ER 20 MEQ PO TBCR
20.0000 meq | EXTENDED_RELEASE_TABLET | Freq: Every day | ORAL | Status: DC
  Administered 2019-08-01 – 2019-08-07 (×7): 20 meq via ORAL
  Filled 2019-08-01 (×6): qty 1

## 2019-08-01 MED ORDER — THIAMINE HCL 100 MG/ML IJ SOLN
500.0000 mg | Freq: Three times a day (TID) | INTRAVENOUS | Status: DC
  Filled 2019-08-01 (×2): qty 5

## 2019-08-01 MED ORDER — THIAMINE HCL 100 MG PO TABS
500.0000 mg | ORAL_TABLET | Freq: Three times a day (TID) | ORAL | Status: AC
  Administered 2019-08-02 – 2019-08-03 (×5): 500 mg via ORAL
  Filled 2019-08-01 (×5): qty 5

## 2019-08-01 NOTE — Progress Notes (Addendum)
Physical Therapy Session Note  Patient Details  Name: Chase Caldwell MRN: [id number] Date of Birth: [birth year]  Today's Date: 08/01/2019 PT Co-Treatment Time: 1037-1200 PT Individual Time Calculation (min): 83 min   Short Term Goals: Week 2:  PT Short Term Goal 1 (Week 2): Patient will perform supine to sit with max A +1 consistently. PT Short Term Goal 2 (Week 2): Patient will perform sit to supine with mod A +2. PT Short Term Goal 3 (Week 2): Patient will perform basic transfers with mod A +2. PT Short Term Goal 4 (Week 2): Patient will initiate power w/c mobility.  Skilled Therapeutic Interventions/Progress Updates:     Patient in bed with his parents at bedside upon PT arrival. Patient alert and agreeable to PT session. Patient reported moderate intermittent  L wrist pain during session, RN made aware. PT provided repositioning, rest breaks, and distraction as pain interventions throughout session.   Educated patient and his parent's on positioning and procedure for initiating serial casting of R ankle. Patient assisted to prone position on bariatric [name] max A +3 due to management of equipment and need for scooting in the bed for improved positioning. Provided verbal cues for sequencing throughout. Placed small red wedge beneath patient's chest with a pillow for his head and 2 pillows under his pelvis in prone position for comfort and reduced tone. Performed prolonged DF stretch to R foot x4 with patient's knee bent and manual pressure to promote DF with mild eversion and toe extension to neutral, second person providing stabilization of tibia and maintaining hip rotation in neutral throughout. Goniometric measurement of -11 degrees DF after. Performed same stretch with 2 person assist during placement of R LE cast to reduce PF tone. Cast placed by [name], OT during session. [name] parents provided comfort/distraction to patient throughout casting and followed demonstration of  management of R wrist flexor tone and use of suction to manage patient's increased drool production in prone position. Patient alert and verbal throughout procedure, denied pain, only expressed frustration with increased drool production. Following casting, returned patient to supine and pulled him up in bed with max-total A +3. Positioned patient in seated position in the bed. Patient denied pain/discofort from cast and stated "It actually feels good." Educated patient and his parents on informing nursing staff immediately if he is having pain or pressure from the cast. Plan for orthotech to be on-call to remove the cast if patient is unable to tolerate it, per MD orders. Patient and his parents in agreement.   Patient seated in the bed with his parents at bedside at end of session with breaks locked and soft touch call bed clasped to his shirt within reach.    Therapy Documentation Precautions:  Precautions Precautions: Fall, Other (comment) Precaution Comments: PEG, significant hypertonia, right ankle PF contracture Required Braces or Orthoses: Other Brace Other Brace: abdominal binder (to keep PEG safe) Restrictions Weight Bearing Restrictions: No    Therapy/Group: Co-Treatment  Heber Hoog L Edwar Coe PT, DPT  08/01/2019, 6:54 PM

## 2019-08-01 NOTE — Progress Notes (Signed)
Occupational Therapy Session Note  Patient Details  Name: Chase Caldwell MRN: [id number] Date of Birth: 12-May-1980  Today's Date: 08/01/2019 OT Individual Time: 1308-6578 OT Individual Time Calculation (min): 60 min    Short Term Goals: Week 1:  OT Short Term Goal 1 (Week 1): Pt will sitEOB with mod A in prep for functional ADL task OT Short Term Goal 1 - Progress (Week 1): Met OT Short Term Goal 2 (Week 1): Pt will self feed with AE with max A OT Short Term Goal 2 - Progress (Week 1): Met OT Short Term Goal 3 (Week 1): Pt will tolerate sitting upright out of bed for ~3 hrs OT Short Term Goal 3 - Progress (Week 1): Progressing toward goal OT Short Term Goal 4 (Week 1): Pt will roll to right and left with max A of one caregiver OT Short Term Goal 4 - Progress (Week 1): Progressing toward goal  Skilled Therapeutic Interventions/Progress Updates:    1:1. Pt much more alert this session. Pt and wife present this session. Pt with intermittent pain depending on LUE position heat provided throughout session for pain and tone management. Pt with increased tone during any intentional movmeent. Pt requires total A for dressing at bed level briding hips with MIN A to stabilize feet while 2 people change pants/cleanse peri area. Pt completes sliding board transfer with MAX A +2 to/from EOB to TIS with L hand getting caught onto leg rest mid transfer requring A to move. Pt with decreased ability to scoot laterally this date. Pt completes tabletop activity feeling for pegs on peg board, grasping and placing into bucket with RUE in prep for vision compensation strategy for self feeding and grooming. Pt demo multiple grip slips throughout activitiy. Exited session wih tpt seated in bed, exit alarm on and call light in reach  Therapy Documentation Precautions:  Precautions Precautions: Fall, Other (comment) Precaution Comments: PEG, significant hypertonia, right ankle PF contracture Required Braces or  Orthoses: Other Brace Other Brace: abdominal binder (to keep PEG safe) Restrictions Weight Bearing Restrictions: No General:   Vital Signs:  Pain: Pain Assessment Pain Scale: 0-10 Pain Score: 0-No pain ADL: ADL Eating: Dependent Where Assessed-Eating: Bed level Grooming: Dependent Where Assessed-Grooming: Wheelchair Upper Body Bathing: Dependent Where Assessed-Upper Body Bathing: Wheelchair Lower Body Bathing: Dependent Where Assessed-Lower Body Bathing: Edge of bed Upper Body Dressing: Dependent Where Assessed-Upper Body Dressing: Wheelchair Lower Body Dressing: Dependent Toileting: Dependent Toilet Transfer: Not assessed Social research officer, government: Not assessed Vision   Perception    Praxis   Exercises:   Other Treatments:     Therapy/Group: Individual Therapy  Tonny Branch 08/01/2019, 9:41 AM

## 2019-08-01 NOTE — Progress Notes (Addendum)
Patient ID: Romaldo Saville, male   DOB: 02/02/1980, 39 y.o.   MRN: [id number]  SW spoke with pt wife about HHA list, PCS list, and possible CAP/DA as an option for him at d/c. SW to leave HHA/PCS list in room for preferred provider. SW informed will follow-up once more updates on CAP program.  SW waiting on updates from [facility] CAP/DA [phone]) about submitting referral.   *SW spoke with Nira Conn Fields/RN with CAP/DA [place] who entered referral for pt. SW encouraged to follow up with [facility] CAP office about referral. SW left message for Donivan Scull [place] [facility] with [facility] [phone]) to inform on referral submitted and requested return phone call (she is out of the office from 7/19-7/25).   Loralee Pacas, MSW, [place] Office: [phone] Cell: [phone] Fax: [phone]

## 2019-08-01 NOTE — Progress Notes (Signed)
Physical Therapy Session Note  Patient Details  Name: Chase Caldwell MRN: [id number] Date of Birth: [birth year]  Today's Date: 08/01/2019 PT Individual Time: 8270-7867 PT Individual Time Calculation (min): 55 min   Short Term Goals: Week 2:  PT Short Term Goal 1 (Week 2): Patient will perform supine to sit with max A +1 consistently. PT Short Term Goal 2 (Week 2): Patient will perform sit to supine with mod A +2. PT Short Term Goal 3 (Week 2): Patient will perform basic transfers with mod A +2. PT Short Term Goal 4 (Week 2): Patient will initiate power w/c mobility.  Skilled Therapeutic Interventions/Progress Updates:     Patient in bed with his parents at bedside upon PT arrival. Patient alert and agreeable to PT session. Patient denied pain during session. Denied pain or pressure with R LE cast throughout session.   Focused session on standing tolerance and transfer training. Donned post-op shoe over R LE cast to prevent sliding during transfers.  Therapeutic Activity: Bed Mobility: Patient performed supine to/from sit with mod-max A of 1 person. Provided verbal cues for rolling to R side-lying for increased UE use while sitting up. Transfers: Patient performed sit to/from stand x3 with mod-max A +2. Provided verbal cues for timing/initiation, forward weight shift, and hip and knee extension to come to standing. Maintained standing balance ~30 sec on first trial and 1-2 min on second and third trials. Patient able to fully extend trunk and LEs today with cues for hip extension and R knee extension. Patient became emotional in standing crying and stating how happy he was. Provided emotional support and encouragement throughout. Obtained new [name] and Jay cushion during session for improved positioning while in sitting. He then performed a squat pivot transfer bed>[name] with mod-max +2 and lateral scooting in the w/c with max of 1 person with a second person SBA. PT blocked R foot and knee to  prevent sliding or extension throughout all transfers for safety.   Required increased time for positioning and padding of [name] for safety and comfort. Applied gait belts around patient's waist and thighs and propped pillows behind both shoulders for positioning and patient comfort.   Patient in [place] w/c with his parents in the room at end of session with breaks locked and all needs within reach. RN aware and cleared patient's parents to supervise the patient in the room, rather than bringing him to the nurses station. Instructed his parents to alert nursing staff when patient is ready to return to bed or if he requires repositioning.    Therapy Documentation Precautions:  Precautions Precautions: Fall, Other (comment) Precaution Comments: PEG, significant hypertonia, right ankle PF contracture Required Braces or Orthoses: Other Brace Other Brace: abdominal binder (to keep PEG safe) Restrictions Weight Bearing Restrictions: No    Therapy/Group: Individual Therapy  Oryan Winterton L Reshunda Strider PT, DPT  08/01/2019, 6:56 PM

## 2019-08-01 NOTE — Progress Notes (Signed)
Occupational Therapy Note  Patient Details  Name: Chase Caldwell MRN: [id number] Date of Birth: Oct 18, 1980   Pt seen in conjunction with PT for application of serial cast to Rt LE.  See PT note for detail of PROM/stretch, positioning and ROM achieved.   Cast applied to Rt LE with gel padding on bil. Malleolus, Lateral aspect of foot extending to little toe, and under Rt great toe.  Velfoam added to the dorsum area of the foot prevent pressure wound.  Pt tolerated procedure well in prone position.   Parents present and instructed in the purpose of casting, and were able to provide distraction and comfort patient during procedure.  Cast and LE checked 2x in the afternoon with pt expressing no pain, and reports "it feels good".  He, parents, and wife were instructed in the signs/symptoms of pressure and to contact RN.  Cast was marked to allow it to be bivalved in case it needs to be removed.  PM ortho tech, Chase Caldwell was called and instructed on the cast, how to bivalve it if pt has difficulty during the night.   Chase Nutting., OTR/L Acute Rehabilitation Services Pager [phone] Office [phone]   Chase Caldwell 08/01/2019, 10:46 PM
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
## 2019-08-02 ENCOUNTER — Inpatient Hospital Stay (HOSPITAL_COMMUNITY): Payer: Medicaid Other

## 2019-08-02 ENCOUNTER — Inpatient Hospital Stay (HOSPITAL_COMMUNITY): Payer: Medicaid Other | Admitting: Occupational Therapy

## 2019-08-02 ENCOUNTER — Inpatient Hospital Stay (HOSPITAL_COMMUNITY): Payer: Medicaid Other | Admitting: Speech Pathology

## 2019-08-02 DIAGNOSIS — E876 Hypokalemia: Secondary | ICD-10-CM

## 2019-08-02 LAB — COMPREHENSIVE METABOLIC PANEL
ALT: 101 U/L — ABNORMAL HIGH (ref 0–44)
AST: 41 U/L (ref 15–41)
Albumin: 3.2 g/dL — ABNORMAL LOW (ref 3.5–5.0)
Alkaline Phosphatase: 103 U/L (ref 38–126)
Anion gap: 9 (ref 5–15)
BUN: 10 mg/dL (ref 6–20)
CO2: 25 mmol/L (ref 22–32)
Calcium: 9.3 mg/dL (ref 8.9–10.3)
Chloride: 106 mmol/L (ref 98–111)
Creatinine, Ser: 0.7 mg/dL (ref 0.61–1.24)
GFR calc Af Amer: >60 mL/min (ref >60)
GFR calc non Af Amer: >60 mL/min (ref >60)
Glucose, Bld: 113 mg/dL — ABNORMAL HIGH (ref 70–99)
Potassium: 4.2 mmol/L (ref 3.5–5.1)
Sodium: 140 mmol/L (ref 135–145)
Total Bilirubin: 0.5 mg/dL (ref 0.3–1.2)
Total Protein: 6.6 g/dL (ref 6.5–8.1)

## 2019-08-02 LAB — CBC WITH DIFFERENTIAL/PLATELET
Abs Immature Granulocytes: 0.02 10*3/uL (ref 0.00–0.07)
Basophils Absolute: 0 10*3/uL (ref 0.0–0.1)
Basophils Relative: 1 %
Eosinophils Absolute: 0.1 10*3/uL (ref 0.0–0.5)
Eosinophils Relative: 2 %
HCT: 39.9 % (ref 39.0–52.0)
Hemoglobin: 12.8 g/dL — ABNORMAL LOW (ref 13.0–17.0)
Immature Granulocytes: 0 %
Lymphocytes Relative: 27 %
Lymphs Abs: 2.1 10*3/uL (ref 0.7–4.0)
MCH: 28.7 pg (ref 26.0–34.0)
MCHC: 32.1 g/dL (ref 30.0–36.0)
MCV: 89.5 fL (ref 80.0–100.0)
Monocytes Absolute: 0.4 10*3/uL (ref 0.1–1.0)
Monocytes Relative: 6 %
Neutro Abs: 4.9 10*3/uL (ref 1.7–7.7)
Neutrophils Relative %: 64 %
Platelets: 368 10*3/uL (ref 150–400)
RBC: 4.46 MIL/uL (ref 4.22–5.81)
RDW: 13.2 % (ref 11.5–15.5)
WBC: 7.5 10*3/uL (ref 4.0–10.5)
nRBC: 0 % (ref 0.0–0.2)

## 2019-08-02 LAB — URINE CULTURE: Culture: >=100000 — AB

## 2019-08-02 LAB — MAGNESIUM: Magnesium: 2.1 mg/dL (ref 1.7–2.4)

## 2019-08-02 MED ORDER — DIAZEPAM 2 MG PO TABS
2.0000 mg | ORAL_TABLET | Freq: Three times a day (TID) | ORAL | Status: DC
  Administered 2019-08-02 – 2019-08-04 (×6): 2 mg via ORAL
  Filled 2019-08-02 (×6): qty 1

## 2019-08-02 MED ORDER — THIAMINE HCL 100 MG PO TABS
100.0000 mg | ORAL_TABLET | Freq: Every day | ORAL | Status: DC
  Administered 2019-08-04 – 2019-08-17 (×14): 100 mg via ORAL
  Filled 2019-08-02 (×14): qty 1

## 2019-08-02 MED ORDER — FOSFOMYCIN TROMETHAMINE 3 G PO PACK
3.0000 g | PACK | ORAL | Status: DC
  Administered 2019-08-02: 3 g via ORAL
  Filled 2019-08-02: qty 3

## 2019-08-02 NOTE — Progress Notes (Signed)
Patient reported SOB with activity--was nauseous and reported that he did not feel well. Blood pressure elevated initially --note but improved and back to baseline. He has been having issues with nausea on and off per notes. Does have Pseudomonas/Klebsiella UTI--discussed with pharmacy who recommended Fosfomycin q 3 days X 2 doses as patient asymptomatic and UA was clean. Will monitor for now--CBC ordered for baseline WBC. Lungs with rhonchi therefore CXR ordered --NAD.

## 2019-08-02 NOTE — Progress Notes (Signed)
Occupational Therapy Session Note  Patient Details  Name: Chase Caldwell MRN: [id number] Date of Birth: 07-Oct-1980  Today's Date: 08/02/2019 OT Individual Time: 1030-1100 OT Individual Time Calculation (min): 30 min   Skilled Therapeutic Interventions/Progress Updates:    1:1 Received in w/c. Taken in the gym with focus on NMR. Placed flat hand paddle splint on left hand and focus on maintaining  weight bearing with wrist in slight wrist extension (and shoulder in adduction to neutral with elbow slightly bent. During weight bearing focus on functional grasp and release of objects with right hand. Pt reports being able to see colors. Pt continues to over shoot with grasping items.   During tasks pt reports not feeling well and feels like he is going to throw up. Pt presents diaphoretic (pale and clammy to the touch) BP taken 114/70s; tilted back and taken back to the room. Total A +2 back to bed- pt unable to help with transfer. RN came in to assess. Left pt resting in the bed with his parents and nursing.      Therapy Documentation Precautions:  Precautions Precautions: Fall, Other (comment) Precaution Comments: PEG, significant hypertonia, right ankle PF contracture Required Braces or Orthoses: Other Brace Other Brace: abdominal binder (to keep PEG safe) Restrictions Weight Bearing Restrictions: No General:   Vital Signs: Therapy Vitals Temp: 97.9 F (36.6 C) Temp Source: Oral Pulse Rate: 81 Resp: 20 BP: (!) 132/100 Patient Position (if appropriate): Lying Oxygen Therapy SpO2: 97 % O2 Device: Room Air Pain:   ADL: ADL Eating: Dependent Where Assessed-Eating: Bed level Grooming: Dependent Where Assessed-Grooming: Wheelchair Upper Body Bathing: Dependent Where Assessed-Upper Body Bathing: Wheelchair Lower Body Bathing: Dependent Where Assessed-Lower Body Bathing: Edge of bed Upper Body Dressing: Dependent Where Assessed-Upper Body Dressing: Wheelchair Lower Body  Dressing: Dependent Toileting: Dependent Toilet Transfer: Not assessed Social research officer, government: Not assessed Vision   Perception    Praxis   Exercises:   Other Treatments:     Therapy/Group: Individual Therapy  Willeen Cass [facility] 08/02/2019, 2:00 PM

## 2019-08-02 NOTE — Progress Notes (Signed)
[place] PHYSICAL MEDICINE & REHABILITATION PROGRESS NOTE  Subjective/Complaints: Patient seen sitting up in bed this morning.  Mother at bedside.  She states patient did not sleep well overnight due to spasms.  Patient confirms.  Discussed with therapies casting.  He asks for increase in his antispasmodics.  He was seen by neurology yesterday, notes reviewed-plan for imaging in 10 days, labs ordered.  ROS: Denies CP, SOB, N/V/D  Objective: Vital Signs: Blood pressure (!) 150/97, pulse 84, temperature 98.9 F (37.2 C), resp. rate 16, height 6' (1.829 m), weight (!) 118.4 kg, SpO2 99 %. No results found. No results for input(s): WBC, HGB, HCT, PLT in the last 72 hours. Recent Labs    08/01/19 0600  NA 141  K 3.3*  CL 108  CO2 25  GLUCOSE 107*  BUN 9  CREATININE 0.70  CALCIUM 9.1    Physical Exam: BP (!) 150/97 (BP Location: Right Arm)   Pulse 84   Temp 98.9 F (37.2 C)   Resp 16   Ht 6' (1.829 m)   Wt (!) 118.4 kg   SpO2 99%   BMI 35.40 kg/m  Constitutional: No distress . Vital signs reviewed. HENT: Normocephalic.  Atraumatic. Eyes: EOMI. No discharge. Cardiovascular: No JVD.  RRR. Respiratory: Normal effort.  No stridor.  Bilateral clear to auscultation. GI: Non-distended.  BS +. Skin: Warm and dry.  Intact. Psych: Normal mood.  Normal behavior. Musc: No edema in extremities.  No tenderness in extremities. Right ankle casted Neuro: Alert Dysarthria  Facial paralysis, stable Vision remains limited Extensor tone L>R. Has improved control of LUE today. Less pain with PROM as well. Hyperreflexic.   Motor: Limited due to tone, however moving all extremities, right more than left, unchanged.    Assessment/Plan: 1. 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  Physiatrist is providing close team supervision and 24 hour management of  active medical problems listed below.  Physiatrist and rehab team continue to assess barriers to discharge/monitor patient progress toward functional and medical goals  Care Tool:  Bathing        Body parts bathed by helper: Right arm, Left arm, Chest, Abdomen, Front perineal area, Buttocks, Right upper leg, Left upper leg, Right lower leg, Left lower leg, Face     Bathing assist Assist Level: 2 Helpers     Upper Body Dressing/Undressing Upper body dressing        Upper body assist Assist Level: Total Assistance - Patient < 25%    Lower Body Dressing/Undressing Lower body dressing      What is the patient wearing?: Incontinence brief, Underwear/pull up     Lower body assist Assist for lower body dressing: Total Assistance - Patient < 25%     Toileting Toileting    Toileting assist Assist for toileting: 2 Helpers     Transfers Chair/bed transfer  Transfers assist  Chair/bed transfer activity did not occur: Safety/medical concerns (safety/medical b/c we introduced new technique??)  Chair/bed transfer assist level: 2 Helpers     Locomotion Ambulation   Ambulation assist   Ambulation activity did not occur: Safety/medical concerns          Walk 10 feet activity   Assist  Walk 10 feet activity did not occur: Safety/medical concerns        Walk 50 feet activity   Assist Walk 50 feet with 2 turns activity did not  occur: Safety/medical concerns         Walk 150 feet activity   Assist Walk 150 feet activity did not occur: Safety/medical concerns         Walk 10 feet on uneven surface  activity   Assist Walk 10 feet on uneven surfaces activity did not occur: Safety/medical concerns         Wheelchair     Assist Will patient use wheelchair at discharge?:  (likely will require dependent w/c transport)      Wheelchair assist level: Dependent - Patient 0%      Wheelchair 50 feet with 2 turns activity    Assist         Assist Level: Dependent - Patient 0%   Wheelchair 150 feet activity     Assist     Assist Level: Dependent - Patient 0%      Medical Problem List and Plan: 1. Diffuse weakness with hypertonicity and deficits with self-care, swallowing, cognition secondary to TBI with significant Psych history.  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   Per neurology, repeat imaging ~8/2, labs ordered 2.  Antithrombotics: -DVT/anticoagulation:  Pharmaceutical: Lovenox             -antiplatelet therapy: N/a 3. 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   Hydrocodone in place of tramadol for severe pain  Rx tone as below 4. Mood/agitation/hx of bipolar disorder: Neuropsych and psychiatry to follow during his stay. LCSW to follow for evaluation and support             -antipsychotic agents:  seroquel  -continue cymbalta, zoloft  -added gabapentin as above  -ativan BID discontinued.  -psych also following this patient although hasn't seen for some time  See #14 5. Neuropsych: This patient is not fully capable of making decisions on his own behalf. 6. Skin/Wound Care: Routine pressure relief measures.  7. Fluids/Electrolytes/Nutrition:  11. Facial Fx/Maxillary fracture s/p repair with associated dysphagia: Wires removed by ENT             Dysphagia 1 thin diet--advance per SLP    12. ABLA:   Hemoglobin 12.8 on 7/19  Continue to monitor 13. Prediabetes: Hyperglycemia due to tube feeds. Continue feeds at nights as intake remains variable.    Dc cbg checks/SSI  14.  Severe muscle spasms with extensor tone:     Baclofen increased to 10 3 times daily on 7/11 without much effect  7/16 Dantrium trial without benefit, DC'd   7/21--  solid cast on patient per OT  7/22 pt much more alert today. Feels  that klonopin has made a big difference in spasms. Both ativan and gabapentin were stopped   -continue ROM with therapies, serial casting   Klonopin changed to Valium on 7/23 15.  Tachycardia/elevated blood pressure  Continue propanolol which was increased to 40 3 times daily on 7/11  See #14 16.  Transaminitis  LFTs elevated on 7/10, sl increase in ALT while AST is lower 7/22  -recheck next week 17.  Hypokalemia  Creatinine 3.2 on 7/22  Supplement initiated 18.  Hypertension  Lisinopril 10 started on 7/21  Propanolol 40 3 times daily, will consider further increase if persistent  LOS: 14 days A FACE TO FACE EVALUATION WAS PERFORMED  Anhad Sheeley Lorie Phenix 08/02/2019, 10:21 AM

## 2019-08-02 NOTE — Progress Notes (Signed)
Speech Language Pathology Weekly Progress and Session Note  Patient Details  Name: Chase Caldwell MRN: [id number] Date of Birth: 1980/11/09  Beginning of progress report period: July 26, 2019 End of progress report period: August 02, 2019  Today's Date: 08/02/2019 SLP Individual Time: 1430-1445 SLP Individual Time Calculation (min): 15 min and Today's Date: 08/02/2019 SLP Missed Time: 15 Minutes Missed Time Reason: Patient fatigue  Short Term Goals: Week 2: SLP Short Term Goal 1 (Week 2): Pt will consume dys 1 textures and thin liquids with minimal s/s aspriation and Min A verbal cues for use of swallow strategies. SLP Short Term Goal 1 - Progress (Week 2): Met SLP Short Term Goal 2 (Week 2): Patient will recall new, daily information with supervision verbal cues. SLP Short Term Goal 2 - Progress (Week 2): Met SLP Short Term Goal 3 (Week 2): Patient will utilize speech intelligibility strategies at the sentence level with Min A verbal cues to achieve ~80% intelligibility. SLP Short Term Goal 3 - Progress (Week 2): Not met   New Short Term Goals: Week 3: SLP Short Term Goal 1 (Week 3): Pt will consume dys 1 textures and thin liquids with minimal s/s aspriation and supervision verbal cues for use of swallow strategies. SLP Short Term Goal 2 (Week 3): Patient will recall new, daily information with Mod I. SLP Short Term Goal 3 (Week 3): Patient will utilize speech intelligibility strategies at the sentence level with Min A verbal cues to achieve ~80% intelligibility.  Weekly Progress Updates: Patient has made functional gains and has met 2 of 3 STGs this reporting period. Currently, patient is consuming Dys. 1 textures with thin liquids with minimal overt s/s of aspiration. Patient demonstrates improved coordination of swallowing and respiration and appears to have a more swift AP transit with pureed textures. Recommend patient continue current diet per physician orders for a no chew diet. Patient  recalls functional information with overall supervision-Min A verbal cues but continues to require Min-Mod verbal cues for utilization of speech intelligibility strategies and to self-monitor and correct speech speech errors at the sentence level. Intelligibility can also be impacted by medications and lethargy at times. Patient and family education ongoing. Patient would benefit from continued skilled SLP intervention to maximize his cognitive and speech function prior to discharge.      Intensity: Minumum of 1-2 x/day, 30 to 90 minutes Frequency: 3 to 5 out of 7 days Duration/Length of Stay: 08/17/19 Treatment/Interventions: Cognitive remediation/compensation;Cueing hierarchy;Functional tasks;Speech/Language facilitation;Patient/family education;Oral motor exercises;Dysphagia/aspiration precaution training   Daily Session  Skilled Therapeutic Interventions: Skilled treatment session focused on family education. Upon arrival, patient was lethargic with minimal verbal output. Patient with intermittent ability to maintain arousal but would quickly fall back to sleep. Due to lethargy, lunch wasn't attempted due to safety risk. [name] parents present and educated regarding patient's current cognitive, speech and swallowing function and goals of skilled SLP intervention. They both verbalized understanding and all questions were answered. Patient left supine in bed with family present. Continue with current plan of care.      Pain No reports or indication of pain   Therapy/Group: Individual Therapy  Bette Brienza 08/02/2019, 6:33 AM

## 2019-08-02 NOTE — Progress Notes (Signed)
Physical Therapy Session Note  Patient Details  Name: Chase Caldwell MRN: [id number] Date of Birth: 1980-09-05  Today's Date: 08/02/2019 PT Individual Time: 8416-6063 PT Individual Time Calculation (min): 30 min  and Today's Date: 08/02/2019 PT Missed Time: 30 Minutes Missed Time Reason: Patient fatigue  Short Term Goals: Week 1:  PT Short Term Goal 1 (Week 1): Pt will perform supine>sit with max assist of 1 person PT Short Term Goal 1 - Progress (Week 1): Progressing toward goal PT Short Term Goal 2 (Week 1): Pt will perform sit>supine with +2 max assist PT Short Term Goal 2 - Progress (Week 1): Met PT Short Term Goal 3 (Week 1): Pt will perform bed<>chair transfers with +2 max assist PT Short Term Goal 3 - Progress (Week 1): Met PT Short Term Goal 4 (Week 1): Pt will perform sit<>stands with +2 max assist PT Short Term Goal 4 - Progress (Week 1): Met PT Short Term Goal 5 (Week 1): Pt will maintain static sitting balance EOM for at least 1 minute with supervision PT Short Term Goal 5 - Progress (Week 1): Met Week 2:  PT Short Term Goal 1 (Week 2): Patient will perform supine to sit with max A +1 consistently. PT Short Term Goal 2 (Week 2): Patient will perform sit to supine with mod A +2. PT Short Term Goal 3 (Week 2): Patient will perform basic transfers with mod A +2. PT Short Term Goal 4 (Week 2): Patient will initiate power w/c mobility.  Skilled Therapeutic Interventions/Progress Updates:    Attempted to see patient at 1pm but pt sound asleep and calm. Pt's parents in the room and reported he had finally fallen asleep. Determined better to allow patient time to rest than attempt to wake up currently, so agreeable to come back in a little while after having time to rest.   Came back and family agreeable to attempt to arouse him after having time to rest. Pt arousable but lethargic throughout session. Still reports c/o nausea and some dizziness when EOB. Total assist for supine to sit  with max multimodal cues to facilitate movement and manage tone through trunk and LE's. Once EOB pt requires min to max assist for balance with facilitation for anterior weightshift and assist with management of LUE due to spasms and tone. Noted that patient was soiled with urine. Returned to supine as patient not feeling well and needed to change clothes. Max assist for rolling with focuson technique and increasing participation to change brief and bed linens - total assist for hygiene. Left in bed with RN present.  Therapy Documentation Precautions:  Precautions Precautions: Fall, Other (comment) Precaution Comments: PEG, significant hypertonia, right ankle PF contracture Required Braces or Orthoses: Other Brace Other Brace: abdominal binder (to keep PEG safe) Restrictions Weight Bearing Restrictions: No General: PT Amount of Missed Time (min): 30 Minutes PT Missed Treatment Reason: Patient fatigue Vital Signs: BP = 152/101 mmHg and then 153/94 mmHg in supine with HR = 72 bpm and O2 = 94% BP = 128/102 mmHg EOB Pain: unrated but c/o pain at end of session and RN present and administered pain medication   Therapy/Group: Individual Therapy  Canary Brim Ivory Broad, PT, DPT, CBIS  08/02/2019, 3:22 PM

## 2019-08-02 NOTE — Progress Notes (Signed)
[name] family has voiced concerns about today's episode of nausea, fatigue and shortness of breath. Patient told family member he has lost even more vision after today. [name] family is requesting repeat MRI or CT, as they believe he possibly had a mini stroke earlier today. Nurse said she would leave the doctor a note so they could address in the morning.

## 2019-08-02 NOTE — Progress Notes (Signed)
Occupational Therapy Weekly Progress Note  Patient Details  Name: Chase Caldwell MRN: [id number] Date of Birth: 05-28-1980  Beginning of progress report period: July 26, 2019 End of progress report period: August 02, 2019  Today's Date: 08/02/2019 OT Individual Time: 0845-1000 OT Individual Time Calculation (min): 75 min    Patient has met 4 of 4 short term goals.  Pt has made good progress this week improving OOB transfers to MOD +2 or MAX +1 but fluctuates in amount of help with slide board. Pt tone is also improving with medication changes, however still continues to have fluctuating arousal impacting participation in tx (suspect d/t medication side effects). Serial casting has been initiated and pt is currently tolerating well using circumferential casting on R ankle. Daily skin checks and pt report will guide tolerance to keep over the weekend to assist with tone/contracture management. Pt continues to be limited by visual deficits/hypertonia/spasms which are impacting ability to functionally perform ADLs and OT is working on improving performance as well as decreasing BOC.  Patient continues to demonstrate the following deficits: muscle weakness and muscle joint tightness, decreased cardiorespiratoy endurance, impaired timing and sequencing, abnormal tone, unbalanced muscle activation and decreased coordination, decreased visual acuity and decreased visual motor skills, decreased attention, decreased awareness, decreased problem solving, decreased safety awareness, decreased memory and delayed processing and decreased sitting balance, decreased standing balance, decreased postural control and decreased balance strategies and therefore will continue to benefit from skilled OT intervention to enhance overall performance with BADL and Reduce care partner burden.  Patient progressing toward long term goals..  Continue plan of care.  OT Short Term Goals Week 2:  OT Short Term Goal 1 (Week 2): Pt will  roll in B directions with MAX A of 1 caregive to reduce BOC OT Short Term Goal 1 - Progress (Week 2): Met OT Short Term Goal 2 (Week 2): Pt will bridge hips with MIN A of 1 caregiver during ADL to reduce BOC OT Short Term Goal 2 - Progress (Week 2): Met OT Short Term Goal 3 (Week 2): Pt will sit EOB/EOM with MIN A of 1 OT Short Term Goal 3 - Progress (Week 2): Met OT Short Term Goal 4 (Week 2): Pt will complete transfer to TIS with MAX A of 1 in prep for OOB toileting OT Short Term Goal 4 - Progress (Week 2): Met Week 3:  OT Short Term Goal 1 (Week 3): Pt will sit upright OOB for 3 hours to improve OOB tolerance OT Short Term Goal 2 (Week 3): Pt will thread 1UE into shirt wiht MIN A OT Short Term Goal 3 (Week 3): Pt will sit EOB with S overall OT Short Term Goal 4 (Week 3): Pt will complete transfer to w/c with LRAD and MAX A of 1 caregover consistently in prep for OOB toileting  Skilled Therapeutic Interventions/Progress Updates:    1:1. Pt received in TIS agreeable to OT with no pain reporting that the cast is "comfortable." good capillary refill and no pain indicated by pt. No redness/iritation around edges of cast. Wendi, OT present at beginning of session also checking out cast adding increased gel padding around calf. Pt sit to stand with post op shoe donned 2x with MAX A +2 with VC for terminal hip extension and B knee blocking/facilittaion of WB into whole L foot while +3 advances pants past hips. Pt completes wahsing face at sink with HOH A and VC for feeling across counter edge to grasp wash cloth. TOTAL  A for washing hair and changing shirt with pt punching arms through B sleeve. Lateral scoot transfer TIS<>EOM with MAX A of 1 caregiver and facilitation of head hips relationship. Pt completes lateral leans onto elbows/forearms for deep proprioceptive input into joints for tone management demoing increased postural sway at EOM this date and CGA for sitting balance. Exited session with pt  seated in w/c, exit alarm on an dclal light in reach  Therapy Documentation Precautions:  Precautions Precautions: Fall, Other (comment) Precaution Comments: PEG, significant hypertonia, right ankle PF contracture Required Braces or Orthoses: Other Brace Other Brace: abdominal binder (to keep PEG safe) Restrictions Weight Bearing Restrictions: No General:   Vital Signs: Therapy Vitals Temp: 98.9 F (37.2 C) Pulse Rate: 84 Resp: 16 BP: (!) 150/97 Patient Position (if appropriate): Lying Oxygen Therapy SpO2: 99 % O2 Device: Room Air Pain:   ADL: ADL Eating: Dependent Where Assessed-Eating: Bed level Grooming: Dependent Where Assessed-Grooming: Wheelchair Upper Body Bathing: Dependent Where Assessed-Upper Body Bathing: Wheelchair Lower Body Bathing: Dependent Where Assessed-Lower Body Bathing: Edge of bed Upper Body Dressing: Dependent Where Assessed-Upper Body Dressing: Wheelchair Lower Body Dressing: Dependent Toileting: Dependent Toilet Transfer: Not assessed Social research officer, government: Not assessed Vision   Perception    Praxis   Exercises:   Other Treatments:     Therapy/Group: Individual Therapy  Tonny Branch 08/02/2019, 6:51 AM

## 2019-08-02 NOTE — Progress Notes (Addendum)
Patient ID: Ricard Faulkner, male   DOB: Jan 14, 1980, 39 y.o.   MRN: [id number]  SW provided pt parents with handicap placard. SW received updates from parents on preferred HHA- 1) [facility], 2) [place], and 3) [facility] agency- 1) [place], 2) [place], and 3) [place] provided family with SSA kit to file for disability for pt.  Sw sent referral to [facility] for PT/OT/ST/SW. SW waiting on follow-up.  *Reeferral declined.  SW spoke with [facility] [phone]) who reported will have branch review referral for HHPT/OT/SLP/SW.   Loralee Pacas, MSW, [place] Office: [phone] Cell: [phone] Fax: [phone]

## 2019-08-03 DIAGNOSIS — S069X3D Unspecified intracranial injury with loss of consciousness of 1 hour to 5 hours 59 minutes, subsequent encounter: Secondary | ICD-10-CM

## 2019-08-03 DIAGNOSIS — I10 Essential (primary) hypertension: Secondary | ICD-10-CM

## 2019-08-03 MED ORDER — PROPRANOLOL HCL 20 MG PO TABS
60.0000 mg | ORAL_TABLET | Freq: Three times a day (TID) | ORAL | Status: DC
  Administered 2019-08-03 – 2019-08-17 (×39): 60 mg via ORAL
  Filled 2019-08-03 (×17): qty 3
  Filled 2019-08-03: qty 6
  Filled 2019-08-03 (×16): qty 3
  Filled 2019-08-03: qty 6
  Filled 2019-08-03 (×5): qty 3

## 2019-08-03 MED ORDER — VITAMIN B-12 1000 MCG PO TABS
1000.0000 ug | ORAL_TABLET | Freq: Every day | ORAL | Status: DC
  Administered 2019-08-03 – 2019-08-17 (×15): 1000 ug via ORAL
  Filled 2019-08-03 (×15): qty 1

## 2019-08-03 NOTE — Progress Notes (Signed)
[place] PHYSICAL MEDICINE & REHABILITATION PROGRESS NOTE  Subjective/Complaints: Patient seen sitting up in bed this morning.  He states he slept well overnight.  He notes improvement with Valium.  Yesterday patient noted to have shortness of breath, work-up ordered.  He states this is resolved.  No reported issues overnight.  ROS: Denies CP, SOB, N/V/D  Objective: Vital Signs: Blood pressure (!) 135/99, pulse 76, temperature 97.6 F (36.4 C), resp. rate 17, height 6' (1.829 m), weight (!) 118.6 kg, SpO2 96 %. DG CHEST PORT 1 VIEW  Result Date: 08/02/2019 CLINICAL DATA:  Shortness of breath. EXAM: PORTABLE CHEST 1 VIEW COMPARISON:  06/19/2019 FINDINGS: Normal sized heart. Stable mild diffuse peribronchial thickening and accentuation of the interstitial markings. Mild scoliosis. IMPRESSION: No acute abnormality. Stable mild chronic bronchitic changes. Electronically Signed   By: Claudie Revering M.D.   On: 08/02/2019 12:30   Recent Labs    08/02/19 1507  WBC 7.5  HGB 12.8*  HCT 39.9  PLT 368   Recent Labs    08/01/19 0600 08/02/19 1507  NA 141 140  K 3.3* 4.2  CL 108 106  CO2 25 25  GLUCOSE 107* 113*  BUN 9 10  CREATININE 0.70 0.70  CALCIUM 9.1 9.3    Physical Exam: BP (!) 135/99 (BP Location: Right Arm)   Pulse 76   Temp 97.6 F (36.4 C)   Resp 17   Ht 6' (1.829 m)   Wt (!) 118.6 kg   SpO2 96%   BMI 35.46 kg/m  Constitutional: No distress . Vital signs reviewed. HENT: Normocephalic.  Atraumatic. Eyes: EOMI. No discharge. Cardiovascular: No JVD.  RRR. Respiratory: Normal effort.  No stridor.  Upper airway sounds. GI: Non-distended.  BS +. Skin: Warm and dry.  Intact. Psych: Normal mood.  Normal behavior. Musc: No edema in extremities.  No tenderness in extremities. Neuro: Alert Dysarthria, unchanged Facial paralysis, stable Vision remains limited Extensor tone L>R.  Appears improved this morning with decreased effort with range of motion Less  restless Motor: Limited due to tone, however moving all extremities  Assessment/Plan: 1. 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  Physiatrist is providing close team supervision and 24 hour management of active medical problems listed below.  Physiatrist and rehab team continue to assess barriers to discharge/monitor patient progress toward functional and medical goals  Care Tool:  Bathing        Body parts bathed by helper: Right arm, Left arm, Chest, Abdomen, Front perineal area, Buttocks, Right upper leg, Left upper leg, Right lower leg, Left lower leg, Face     Bathing assist Assist Level: 2 Helpers     Upper Body Dressing/Undressing Upper body dressing        Upper body assist Assist Level: Total Assistance - Patient < 25%    Lower Body Dressing/Undressing Lower body dressing      What is the patient wearing?: Incontinence brief, Underwear/pull up     Lower body assist Assist for lower body dressing: Total Assistance - Patient < 25%     Toileting Toileting    Toileting assist Assist for toileting: 2 Helpers     Transfers Chair/bed transfer  Transfers assist  Chair/bed transfer activity did not occur: Safety/medical concerns (safety/medical b/c we introduced new technique??)  Chair/bed transfer assist level: 2 Helpers     Locomotion Ambulation   Ambulation assist   Ambulation  activity did not occur: Safety/medical concerns          Walk 10 feet activity   Assist  Walk 10 feet activity did not occur: Safety/medical concerns        Walk 50 feet activity   Assist Walk 50 feet with 2 turns activity did not occur: Safety/medical concerns         Walk 150 feet activity   Assist Walk 150 feet activity did not occur: Safety/medical concerns         Walk 10 feet on uneven surface   activity   Assist Walk 10 feet on uneven surfaces activity did not occur: Safety/medical concerns         Wheelchair     Assist Will patient use wheelchair at discharge?:  (likely will require dependent w/c transport)      Wheelchair assist level: Dependent - Patient 0%      Wheelchair 50 feet with 2 turns activity    Assist        Assist Level: Dependent - Patient 0%   Wheelchair 150 feet activity     Assist     Assist Level: Dependent - Patient 0%      Medical Problem List and Plan: 1. Diffuse weakness with hypertonicity and deficits with self-care, swallowing, cognition secondary to TBI with significant Psych history.  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   Per neurology, repeat imaging ~8/2, labs ordered-folate within normal limits, vitamin B12-low normal, magnesium pending   B12 supplementation initiated  Chest x-ray personally reviewed, unremarkable for acute infectious process.  Therapy notes reviewed-continues to require total assist. 2.  Antithrombotics: -DVT/anticoagulation:  Pharmaceutical: Lovenox             -antiplatelet therapy: N/a 3. 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   Hydrocodone in place of tramadol for severe pain  Rx tone as below  Appears controlled on 7/24 4. Mood/agitation/hx of bipolar disorder: Neuropsych and psychiatry to follow during his stay. LCSW to follow for evaluation and support             -antipsychotic agents:  seroquel  -continue cymbalta, zoloft  -added gabapentin as above  -ativan BID discontinued.  -psych also following this patient although hasn't seen for some time  See #14 5. Neuropsych: This patient is not fully capable of making decisions on his own behalf. 6. Skin/Wound Care: Routine pressure  relief measures.  7. Fluids/Electrolytes/Nutrition:  11. Facial Fx/Maxillary fracture s/p repair with associated dysphagia: Wires removed by ENT             Dysphagia 1 thins diet--advance per SLP    12. ABLA:   Hemoglobin 12.8 on 7/19  Continue to monitor 13. Prediabetes: Hyperglycemia due to tube feeds. Continue feeds at nights as intake remains variable.    Dc cbg checks/SSI  14.  Severe muscle spasms with extensor tone:     Baclofen increased to 10 3 times daily on 7/11 without much effect  7/16 Dantrium trial without benefit, DC'd   7/21--  solid cast on patient per OT  7/22 pt much more alert today. Feels that klonopin has made a big difference in spasms. Both ativan and gabapentin were stopped   -continue  ROM with therapies, serial casting   Klonopin changed to Valium on 7/23 with benefit 15.  Tachycardia/elevated blood pressure  Continue propanolol which was increased to 40 3 times daily on 7/11  See #14 16.  Transaminitis  ALT remains elevated on 7/23 17.  Hypokalemia  Potassium 4.2 on 7/23, labs ordered for Monday  Supplement initiated 18.  Hypertension  Lisinopril 10 started on 7/21  Propanolol 40 3 times daily, increased to 60 3 times daily on 7/24  Labile, particularly diastolic pressures on 8/33  LOS: 15 days A FACE TO FACE EVALUATION WAS PERFORMED  Aul Mangieri Lorie Phenix 08/03/2019, 4:54 PM

## 2019-08-04 ENCOUNTER — Inpatient Hospital Stay (HOSPITAL_COMMUNITY): Payer: Medicaid Other

## 2019-08-04 ENCOUNTER — Inpatient Hospital Stay (HOSPITAL_COMMUNITY): Payer: Medicaid Other | Admitting: Occupational Therapy

## 2019-08-04 LAB — GLUCOSE, CAPILLARY: Glucose-Capillary: 99 mg/dL (ref 70–99)

## 2019-08-04 MED ORDER — DIAZEPAM 5 MG PO TABS
5.0000 mg | ORAL_TABLET | Freq: Once | ORAL | Status: AC
  Administered 2019-08-04: 5 mg via ORAL
  Filled 2019-08-04: qty 1

## 2019-08-04 MED ORDER — DIAZEPAM 5 MG PO TABS
5.0000 mg | ORAL_TABLET | Freq: Three times a day (TID) | ORAL | Status: DC
  Administered 2019-08-04: 5 mg via ORAL
  Filled 2019-08-04: qty 1

## 2019-08-04 MED ORDER — DIAZEPAM 5 MG PO TABS: 10.0000 mg | ORAL_TABLET | Freq: Three times a day (TID) | ORAL | Status: DC

## 2019-08-04 MED ORDER — CLONAZEPAM 0.5 MG PO TABS
1.0000 mg | ORAL_TABLET | ORAL | Status: DC
  Administered 2019-08-04 – 2019-08-10 (×16): 1 mg via ORAL
  Filled 2019-08-04 (×17): qty 2

## 2019-08-04 NOTE — Progress Notes (Signed)
Occupational Therapy Session Note  Patient Details  Name: Chase Caldwell MRN: [id number] Date of Birth: 05-12-1980  Today's Date: 08/04/2019 OT Individual Time: 3299-2426 OT Individual Time Calculation (min): 43 min   Skilled Therapeutic Interventions/Progress Updates:    Pt greeted in bed, asleep, easily woken. His parents were at bedside. Pt reported his leg cast was rubbing his upper leg. OT inspected this area with no redness noted. Per mother, staff added extra material around this area to prevent rubbing of cast. Notified primary PT of need to have cast refitted. Mod A for rolling Rt>Lt to don pants first, pt exhibiting increased frustration when asked to assist with pulling pants up when fabric was placed in his Rt hand beforehand. Note that pt needed assistance to open up his Rt hand to release fabric due to hypertonicity. Lt hand is contractured with spasmatic activity noted, nonfunctional. Supine<sit completed with +2 assist. Mod-Max A for sitting balance while sneaker and Darco shoe were donned. Pt reported discomfort around PEG site, noted buildup around PEG site once abdominal binder was removed with no dressing applied. RN in during session to clean area and apply dressing. We discussed ordering a larger abdominal binder from ortho to increase comfort and overall fit to protect PEG. After abdominal binder was reapplied, educated pts parents on setting up the TIS for slideboard transfer. Pt executed transfer with +2 assist. Afterwards he reported increased nausea. Tilted pt back with safety belt and belt strapped to legs to prevent hip splaying. Pt left in care of RN to receive antinausea medicine.   Therapy Documentation Precautions:  Precautions Precautions: Fall, Other (comment) Precaution Comments: PEG, significant hypertonia, right ankle PF contracture Required Braces or Orthoses: Other Brace Other Brace: abdominal binder (to keep PEG safe) Restrictions Weight Bearing Restrictions:  No Vital Signs: Therapy Vitals Temp: 97.9 F (36.6 C) Pulse Rate: 68 Resp: 17 BP: (!) 140/87 Patient Position (if appropriate): Lying Oxygen Therapy SpO2: 96 % O2 Device: Room Air Pain: RN in at end of session to administer pain medicine per pt request   ADL: ADL Eating: Dependent Where Assessed-Eating: Bed level Grooming: Dependent Where Assessed-Grooming: Wheelchair Upper Body Bathing: Dependent Where Assessed-Upper Body Bathing: Wheelchair Lower Body Bathing: Dependent Where Assessed-Lower Body Bathing: Edge of bed Upper Body Dressing: Dependent Where Assessed-Upper Body Dressing: Wheelchair Lower Body Dressing: Dependent Toileting: Dependent Toilet Transfer: Not assessed Gaffer Transfer: Not assessed      Therapy/Group: Individual Therapy  Daemien Fronczak A Regenia Erck 08/04/2019, 4:30 PM

## 2019-08-04 NOTE — Progress Notes (Signed)
Orthopedic Tech Progress Note Patient Details:  Chase Caldwell 10/19/1980 [id number]  Ortho Devices Type of Ortho Device: Abdominal binder Ortho Device/Splint Location: RLE Ortho Device/Splint Interventions: Ordered   Post Interventions Patient Tolerated: Well Instructions Provided: Adjustment of device   Makih Stefanko A Karenna Romanoff 08/04/2019, 12:45 PM

## 2019-08-04 NOTE — Progress Notes (Signed)
Physical Therapy Weekly Progress Note  Patient Details  Name: Chase Caldwell MRN: [id number] Date of Birth: Aug 03, 1980  Beginning of progress report period: July 28, 2019 End of progress report period: August 04, 2019  Today's Date: 08/04/2019 PT Individual Time: 1300-1345 PT Individual Time Calculation (min): 45 min   Patient has met 1 of 4 short term goals.  Patient has made appropriate progress this week improving bed mobility to mod A +1 supine>sit and mod A +2 for sit to supine, OOB transfer to mod A +2-max A +1 both with and without a slide board. Patient continues to be limited by increased tone in all extremities with some improvement this week with changes in medication and initiation of serial casting to R LE for PF tone. Continues to have fluctuating arousal impacting participation and assist level with mobility.   Patient continues to demonstrate the following deficits muscle weakness and muscle joint tightness, decreased cardiorespiratoy endurance, impaired timing and sequencing, abnormal tone, unbalanced muscle activation, decreased coordination and decreased motor planning, decreased visual acuity and decreased visual perceptual skills and decreased sitting balance, decreased standing balance, decreased postural control and decreased balance strategies and therefore will continue to benefit from skilled PT intervention to increase functional independence with mobility.  Patient progressing toward long term goals..  Continue plan of care.  PT Short Term Goals Week 2:  PT Short Term Goal 1 (Week 2): Patient will perform supine to sit with max A +1 consistently. PT Short Term Goal 1 - Progress (Week 2): Met PT Short Term Goal 2 (Week 2): Patient will perform sit to supine with mod A +2. PT Short Term Goal 2 - Progress (Week 2): Progressing toward goal PT Short Term Goal 3 (Week 2): Patient will perform basic transfers with mod A +2. PT Short Term Goal 3 - Progress (Week 2): Progressing  toward goal PT Short Term Goal 4 (Week 2): Patient will initiate power w/c mobility. PT Short Term Goal 4 - Progress (Week 2): Progressing toward goal Week 3:  PT Short Term Goal 1 (Week 3): Patient will initiate power w/c mobility. PT Short Term Goal 2 (Week 3): Patient will perform bed mobility with max A +1 consistently. PT Short Term Goal 3 (Week 3): Patient will perform basic transfer with max A +1.  Skilled Therapeutic Interventions/Progress Updates:     Patient in bed asleep with his parents in the room upon PT arrival. Patient alert and agreeable to PT session. Patient reported 7-8/10 L wrist and LE pain with increased spasticity and flexor tone during session, RN made aware. PT provided repositioning, rest breaks, and distraction as pain interventions throughout session. RN provided medications at beginning of session.   Assessed R LE cast, no signs of skin breakdown at calf or toes and patient denied any pain from the cast. He did report mild discomfort at his calf. Cleared out additional padding that was unraveling and noted cast to have minimal padding at proximal end and 3" gel piece had come loose. Applied narrow foam dressings around edge of cast to reduce friction and placed gel piece back behind his calf for comfort and to promote skin integrity. Will discuss to [facility], OT about plan for re-casting R foot.    Patient performed rolling L x2 for improved acces with padding cast with max A and increased difficulty due to increased LE and trunk flexor tone this session. Provided cues for sequencing.   Patient with constant L UR extensor tone at shoulder and elbow  and wrist flexion with pain throughout assessment. PT applied pressure to agonist muscles to inhibit activation and cued patient to touch his chin to promote elbow and shoulder flexion. Patient with great difficulty initiating this action today, despite good success with this strategy mid last week. Required significant time and  effort to reduce extensor tone. Performed soft tissue mobilization once extensor tone reduced and patient relaxed. As soon as PT let go of patient's arm his arm returned to full elbow/sholder extensor pattern. Educated patient's parents about technique for comfort. Also performed similar technique for trunk and LE tone with minimal success. Patient unable to tolerate extending LEs this session even with prolonged pressure and soft tissue mobilization. Unable to attempt standing using [name] features due to increased LE and trunk flexor tone limiting ability to place straps.   [name] parents had several questions about patient's tone, report increase in tone the past 2 days and poor sitting tolerance in the [name] today, other days this has been a position of comfort OOB. Attempted to assist patient EOB and patient decline after initiation due to discomfort. RN and MD made aware of changes in patient's tone this session.  Patient in bed with his parents at bedside at end of session with breaks locked and all needs within reach.    Therapy Documentation Precautions:  Precautions Precautions: Fall, Other (comment) Precaution Comments: PEG, significant hypertonia, right ankle PF contracture Required Braces or Orthoses: Other Brace Other Brace: abdominal binder (to keep PEG safe) Restrictions Weight Bearing Restrictions: No   Therapy/Group: Individual Therapy  Jaivion Kingsley L Ausencio Vaden PT, DPT  08/04/2019, 3:52 PM

## 2019-08-04 NOTE — Progress Notes (Signed)
Patient resting at frequently intervals during shift in no apparent distress, in good spirits, wife at bedside,takes po liquid well, refer to assess ment data sheet ,Orth hans splint applied to left hand, monitored and assisted, continue medical regime . Wife in at bedside  \

## 2019-08-04 NOTE — Progress Notes (Signed)
PHYSICAL MEDICINE & REHABILITATION PROGRESS NOTE  Subjective/Complaints: Patient seen sitting up in bed this morning.  Wife at bedside.  Patient states he feels well.  He requests to change Valium to Klonopin, however stated earlier that Klonopin was ineffective.  He notes spasms at the beginning of the night, but improved per wife.  Patient would also like casting of left upper extremity.  Patient slept fairly well overnight per nursing as well.  ROS: Denies CP, SOB, N/V/D  Objective: Vital Signs: Blood pressure (!) 133/91, pulse 75, temperature 98.7 F (37.1 C), resp. rate 18, height 6' (1.829 m), weight (!) 118.6 kg, SpO2 97 %. DG CHEST PORT 1 VIEW  Result Date: 08/02/2019 CLINICAL DATA:  Shortness of breath. EXAM: PORTABLE CHEST 1 VIEW COMPARISON:  06/19/2019 FINDINGS: Normal sized heart. Stable mild diffuse peribronchial thickening and accentuation of the interstitial markings. Mild scoliosis. IMPRESSION: No acute abnormality. Stable mild chronic bronchitic changes. Electronically Signed   By: Claudie Revering M.D.   On: 08/02/2019 12:30   Recent Labs    08/02/19 1507  WBC 7.5  HGB 12.8*  HCT 39.9  PLT 368   Recent Labs    08/02/19 1507  NA 140  K 4.2  CL 106  CO2 25  GLUCOSE 113*  BUN 10  CREATININE 0.70  CALCIUM 9.3    Physical Exam: BP (!) 133/91   Pulse 75   Temp 98.7 F (37.1 C)   Resp 18   Ht 6' (1.829 m)   Wt (!) 118.6 kg   SpO2 97%   BMI 35.46 kg/m  Constitutional: No distress . Vital signs reviewed. HENT: Normocephalic.  Atraumatic. Eyes: EOMI. No discharge. Cardiovascular: No JVD.  RRR. Respiratory: Normal effort.  No stridor.  Bilateral clear to auscultation. GI: Non-distended.  BS +. Skin: Warm and dry.  Intact. Psych: Normal mood.  Normal behavior. Musc: No edema in extremities.  No tenderness in extremities. Right ankle in cast. Neuro: Alert Dysarthria, improving Facial paralysis, stable Vision remains limited Extensor tone most  significant in left upper extremity Last restless Motor: Limited due to tone, however moving all extremities  Assessment/Plan: 1. 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  Physiatrist is providing close team supervision and 24 hour management of active medical problems listed below.  Physiatrist and rehab team continue to assess barriers to discharge/monitor patient progress toward functional and medical goals  Care Tool:  Bathing        Body parts bathed by helper: Right arm, Left arm, Chest, Abdomen, Front perineal area, Buttocks, Right upper leg, Left upper leg, Right lower leg, Left lower leg, Face     Bathing assist Assist Level: 2 Helpers     Upper Body Dressing/Undressing Upper body dressing        Upper body assist Assist Level: Total Assistance - Patient < 25%    Lower Body Dressing/Undressing Lower body dressing      What is the patient wearing?: Incontinence brief, Underwear/pull up     Lower body assist Assist for lower body dressing: Total Assistance - Patient < 25%     Toileting Toileting    Toileting assist Assist for toileting: 2 Helpers     Transfers Chair/bed transfer  Transfers assist  Chair/bed transfer activity did not occur: Safety/medical concerns (safety/medical b/c we introduced new technique??)  Chair/bed transfer assist level: 2 Helpers     Locomotion  Ambulation   Ambulation assist   Ambulation activity did not occur: Safety/medical concerns          Walk 10 feet activity   Assist  Walk 10 feet activity did not occur: Safety/medical concerns        Walk 50 feet activity   Assist Walk 50 feet with 2 turns activity did not occur: Safety/medical concerns         Walk 150 feet activity   Assist Walk 150 feet activity did not occur: Safety/medical concerns         Walk  10 feet on uneven surface  activity   Assist Walk 10 feet on uneven surfaces activity did not occur: Safety/medical concerns         Wheelchair     Assist Will patient use wheelchair at discharge?:  (likely will require dependent w/c transport)      Wheelchair assist level: Dependent - Patient 0%      Wheelchair 50 feet with 2 turns activity    Assist        Assist Level: Dependent - Patient 0%   Wheelchair 150 feet activity     Assist     Assist Level: Dependent - Patient 0%      Medical Problem List and Plan: 1. Diffuse weakness with hypertonicity and deficits with self-care, swallowing, cognition secondary to TBI with significant Psych history.  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   Per neurology, repeat imaging ~8/2, labs ordered-folate within normal limits, vitamin B12-low normal, magnesium remains pending   B12 supplementation initiated  Chest x-ray personally reviewed, unremarkable for acute infectious process. 2.  Antithrombotics: -DVT/anticoagulation:  Pharmaceutical: Lovenox             -antiplatelet therapy: N/a 3. 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   Hydrocodone in place of tramadol for severe pain  Rx tone as below  Appears controlled on 7/25 4. Mood/agitation/hx of bipolar disorder: Neuropsych and psychiatry to follow during his stay. LCSW to follow for evaluation and support             -antipsychotic agents:  seroquel  -continue cymbalta, zoloft  -added gabapentin as above  -ativan BID discontinued.  -psych also following this patient although hasn't seen for some time  See #14 5. Neuropsych: This patient is not fully capable of making decisions on his own behalf. 6. Skin/Wound Care: Routine pressure relief measures.  7.  Fluids/Electrolytes/Nutrition:  11. Facial Fx/Maxillary fracture s/p repair with associated dysphagia: Wires removed by ENT             Dysphagia 1 thinsdiet--advance per SLP    12. ABLA:   Hemoglobin 12.8 on 7/19  Continue to monitor 13. Prediabetes: Hyperglycemia due to tube feeds. Continue feeds at nights as intake remains variable.    Dc cbg checks/SSI  14.  Severe muscle spasms with extensor tone:     Baclofen increased to 10 3 times daily on 7/11 without much effect  7/16 Dantrium trial without benefit, DC'd   7/21--  solid cast on patient per OT  7/22 pt much more alert today. Feels that klonopin has made a big difference in spasms. Both ativan and gabapentin were stopped   -continue  ROM with therapies, serial casting   Klonopin changed to Valium on 7/23 with benefit, increased on 7/25 15.  Tachycardia/elevated blood pressure  Continue propanolol which was increased to 40 3 times daily on 7/11  See #14 16.  Transaminitis  ALT remains elevated on 7/23 17.  Hypokalemia  Potassium 4.2 on 7/23, labs ordered for tomorrow  Supplement initiated 18.  Hypertension  Lisinopril 10 started on 7/21  Propanolol 40 3 times daily, increased to 60 3 times daily on 7/24  Relatively controlled on 7/25  LOS: 16 days A FACE TO FACE EVALUATION WAS PERFORMED  Chase Caldwell Lorie Phenix 08/04/2019, 9:33 AM

## 2019-08-04 NOTE — Progress Notes (Addendum)
[name] parents are extremely concerned about patient. [name] parents voiced concern that current medications were not working, and once extra dose of Valium was given they stated "he is having an allergic reaction or toxic shock" because patient still had muscle tone and was crying. Nurse educated patient's family that allergic reaction is unlikely considering he has taken this medication several times previously and did not show signs or symptoms of allergic reaction. Parents are standing over patient's bed constantly trying to readjust him and turning him from side to side. All PRN medications have been given, vitals are within normal limits along with blood sugar. Nurse encouraged parents to be present when doctors rounded in the morning so they could voice their concerns.

## 2019-08-05 ENCOUNTER — Inpatient Hospital Stay (HOSPITAL_COMMUNITY): Payer: Medicaid Other

## 2019-08-05 ENCOUNTER — Inpatient Hospital Stay (HOSPITAL_COMMUNITY): Payer: Medicaid Other | Admitting: Occupational Therapy

## 2019-08-05 ENCOUNTER — Inpatient Hospital Stay (HOSPITAL_COMMUNITY): Payer: Medicaid Other | Admitting: Speech Pathology

## 2019-08-05 DIAGNOSIS — R0602 Shortness of breath: Secondary | ICD-10-CM

## 2019-08-05 DIAGNOSIS — M6289 Other specified disorders of muscle: Secondary | ICD-10-CM

## 2019-08-05 DIAGNOSIS — N390 Urinary tract infection, site not specified: Secondary | ICD-10-CM

## 2019-08-05 LAB — BASIC METABOLIC PANEL
Anion gap: 11 (ref 5–15)
BUN: 13 mg/dL (ref 6–20)
CO2: 25 mmol/L (ref 22–32)
Calcium: 9.7 mg/dL (ref 8.9–10.3)
Chloride: 102 mmol/L (ref 98–111)
Creatinine, Ser: 0.79 mg/dL (ref 0.61–1.24)
GFR calc Af Amer: >60 mL/min (ref >60)
GFR calc non Af Amer: >60 mL/min (ref >60)
Glucose, Bld: 104 mg/dL — ABNORMAL HIGH (ref 70–99)
Potassium: 4 mmol/L (ref 3.5–5.1)
Sodium: 138 mmol/L (ref 135–145)

## 2019-08-05 LAB — CBC
HCT: 44.7 % (ref 39.0–52.0)
Hemoglobin: 14.4 g/dL (ref 13.0–17.0)
MCH: 28.3 pg (ref 26.0–34.0)
MCHC: 32.2 g/dL (ref 30.0–36.0)
MCV: 87.8 fL (ref 80.0–100.0)
Platelets: 371 10*3/uL (ref 150–400)
RBC: 5.09 MIL/uL (ref 4.22–5.81)
RDW: 13 % (ref 11.5–15.5)
WBC: 8.9 10*3/uL (ref 4.0–10.5)
nRBC: 0 % (ref 0.0–0.2)

## 2019-08-05 MED ORDER — CIPROFLOXACIN HCL 250 MG PO TABS: 500.0000 mg | ORAL_TABLET | Freq: Two times a day (BID) | ORAL | Status: DC

## 2019-08-05 MED ORDER — CIPROFLOXACIN HCL 250 MG PO TABS
500.0000 mg | ORAL_TABLET | Freq: Two times a day (BID) | ORAL | Status: AC
  Administered 2019-08-05 – 2019-08-10 (×10): 500 mg via ORAL
  Filled 2019-08-05 (×11): qty 2

## 2019-08-05 MED ORDER — BACLOFEN 20 MG PO TABS
20.0000 mg | ORAL_TABLET | Freq: Three times a day (TID) | ORAL | Status: DC
  Administered 2019-08-05 – 2019-08-17 (×34): 20 mg via ORAL
  Filled 2019-08-05 (×35): qty 1

## 2019-08-05 MED ORDER — PIPERACILLIN-TAZOBACTAM 3.375 G IVPB
3.3750 g | Freq: Three times a day (TID) | INTRAVENOUS | Status: DC
  Filled 2019-08-05 (×2): qty 50

## 2019-08-05 NOTE — Progress Notes (Signed)
Occupational Therapy Session Note  Patient Details  Name: Chase Caldwell MRN: [id number] Date of Birth: October 14, 1980  Today's Date: 08/05/2019 OT Individual Time: 4656-8127 OT Individual Time Calculation (min): 28 min    Short Term Goals: Week 3:  OT Short Term Goal 1 (Week 3): Pt will sit upright OOB for 3 hours to improve OOB tolerance OT Short Term Goal 2 (Week 3): Pt will thread 1UE into shirt wiht MIN A OT Short Term Goal 3 (Week 3): Pt will sit EOB with S overall OT Short Term Goal 4 (Week 3): Pt will complete transfer to w/c with LRAD and MAX A of 1 caregover consistently in prep for OOB toileting  Skilled Therapeutic Interventions/Progress Updates:    Upon entering the room, pt on EOB with therapist attempting to transfer pt into wheelchair. Pt needing +3 assistance for slide board transfer into wheelchair this session. OT exiting the room with pt in tilt in space wheelchair and pt immediately reports nausea. OT placing cold cloth on neck and providing emesis bag for support. Pt reports feeling some better after having some ginger ale provided by therapist. OT placing L hand brace on pt and then returning him to room. Pt then requesting to return to bed ( after sitting up only 25 minutes). OT encouraged pt to remain in chair for lunch with pt began crying and yelling out, " You guys suck" and "my fucking ass hurts." OT educating pt and family members present about importance of sitting up. Lap belt donned for safety and pt tilted for pressure relief. Call bell and all needed items within reach.   Therapy Documentation Precautions:  Precautions Precautions: Fall, Other (comment) Precaution Comments: PEG, significant hypertonia, right ankle PF contracture Required Braces or Orthoses: Other Brace Other Brace: abdominal binder (to keep PEG safe) Restrictions Weight Bearing Restrictions: No General: General OT Amount of Missed Time: 20 Minutes ADL: ADL Eating: Dependent Where  Assessed-Eating: Bed level Grooming: Dependent Where Assessed-Grooming: Wheelchair Upper Body Bathing: Dependent Where Assessed-Upper Body Bathing: Wheelchair Lower Body Bathing: Dependent Where Assessed-Lower Body Bathing: Edge of bed Upper Body Dressing: Dependent Where Assessed-Upper Body Dressing: Wheelchair Lower Body Dressing: Dependent Toileting: Dependent Toilet Transfer: Not assessed Gaffer Transfer: Not assessed Other Treatments:     Therapy/Group: Individual Therapy  Gypsy Decant 08/05/2019, 2:29 PM

## 2019-08-05 NOTE — Progress Notes (Signed)
Speech Language Pathology Daily Session Note  Patient Details  Name: Chase Caldwell MRN: [id number] Date of Birth: 02/23/1980  Today's Date: 08/05/2019 SLP Individual Time: 7209-1980 SLP Individual Time Calculation (min): 25 min  Short Term Goals: Week 3: SLP Short Term Goal 1 (Week 3): Pt will consume dys 1 textures and thin liquids with minimal s/s aspriation and supervision verbal cues for use of swallow strategies. SLP Short Term Goal 2 (Week 3): Patient will recall new, daily information with Mod I. SLP Short Term Goal 3 (Week 3): Patient will utilize speech intelligibility strategies at the sentence level with Min A verbal cues to achieve ~80% intelligibility.  Skilled Therapeutic Interventions: Pt was seen for skilled ST targeting speech intelligibility. Pt with excellent recall of some of the target sounds that have been targeted in previous sessions and independently recalled 1 strategy to compensate. Supervision A cues provided for recall of additional strategies. Overall Min A verbal articulatory cues provided for accuracy in production of /s/ and /sh/ phonemes at the sentence level throughout session. Pt was overall 85-90% intelligible to this unfamiliar listener. Pt left laying bed with alarm set and needs within reach. Continue per current plan of care.         Pain Pain Assessment Pain Scale: 0-10 Pain Score: 10-Worst pain ever Pain Type: Acute pain Pain Location: Back Pain Intervention(s): Medication (See eMAR) Multiple Pain Sites: Yes 2nd Pain Site Pain Score: 10 Pain Type: Acute pain Pain Location: Knee Pain Orientation: Right Pain Intervention(s): Medication (See eMAR)  Therapy/Group: Individual Therapy  Arbutus Leas 08/05/2019, 7:31 AM

## 2019-08-05 NOTE — Progress Notes (Signed)
Patient was noted with his leg over the side rail again. Staff assisted as patient was turned & repositioned. Pillows were placed & his wedge was adjusted to keep him from putting his leg over the side rail on the left side. After staff left the room, heard patient yelling for the nurse. Went into the room, patient stated that his hand was stuck. His arm was was along side of his abdomen between the bed & the rail. When pulled out, he yell not that one. When looked, patient had his hand grasping the side rail on the right side of the bed pulling himself, When patient was instructed to let go, he stated he could not & his hand had to be pried from it. He was again turned, this time onto his back. He also stated that he was hot & asked for the blankets to be removed. He did feel sweaty but temperature was just taken & was WNL Blankets were removed as asked & report given to the oncoming nurse. Communication was left for the provider about pain & evaluation post fall on rounds. PA was informed of patients spasms & fall as well.

## 2019-08-05 NOTE — Progress Notes (Signed)
Patient sustained an assisted fall from his bed at approximately 0430. Patients wife was in the room & stated that she awoke to see him with his legs between the side rails to where his knees were on the floor. She stated that she lowered him to the floor. Staff was called & patient was assisted back to bed by maximove with 5 assist. Patient stated that his back & right knee hurt & was given pain relief. Slight redness noted to his right knee & back. No other noted injury. On call provider was called & informed, no new orders at this time. A bolster was placed to his right side in attempt to keep him from being able from getting his legs in between the side rails. Patient has been having muscle spasms frequently throughout the night & works pillows & the bolsters out from the bed. He also has an air mattress that alters the height of the side rails. [name] head of the bed lowered to help prevent a reoccurrence. Floor mats are down for safety. Will continue to monitor for changes.

## 2019-08-05 NOTE — Progress Notes (Signed)
Occupational Therapy Session Note  Patient Details  Name: Chase Caldwell MRN: [id number] Date of Birth: [birth year]  Today's Date: 08/05/2019 OT Individual Time: 1110-1135 OT Individual Time Calculation (min): 25 min  and Today's Date: 08/05/2019 OT Missed Time: 20 Minutes Missed Time Reason: X-Ray   Short Term Goals: Week 3:  OT Short Term Goal 1 (Week 3): Pt will sit upright OOB for 3 hours to improve OOB tolerance OT Short Term Goal 2 (Week 3): Pt will thread 1UE into shirt wiht MIN A OT Short Term Goal 3 (Week 3): Pt will sit EOB with S overall OT Short Term Goal 4 (Week 3): Pt will complete transfer to w/c with LRAD and MAX A of 1 caregover consistently in prep for OOB toileting  Skilled Therapeutic Interventions/Progress Updates:    Pt received in route to x-ray during scheduled therapy time, was able to come back later and make up 25 of 45 min session. Pt supine with c/o pain intermittently with spasming, intervention included repositioning and PROM. Pt able to volitionally move BLE when shorts were positioned to don, with mod positioning assist. Pt completed bridging to don shorts with +2 assist to pull shorts up but pt able to lift his bottom off the bed successfully with ample distal LE support. Pt completed bed mobility to EOB with only mod A. Pt completed lateral scoot transfer with max +2 assist, third person present for safety. Pt required total A for positioning in [name]. Pt passed off to OT in room.  Initiated d/c discussion with pt's parents in room.   Therapy Documentation Precautions:  Precautions Precautions: Fall, Other (comment) Precaution Comments: PEG, significant hypertonia, right ankle PF contracture Required Braces or Orthoses: Other Brace Other Brace: abdominal binder (to keep PEG safe) Restrictions Weight Bearing Restrictions: No General: General OT Amount of Missed Time: 20 Minutes  Therapy/Group: Individual Therapy  [name] 08/05/2019, 12:32  PM

## 2019-08-05 NOTE — Progress Notes (Signed)
Physical Therapy Session Note  Patient Details  Name: Chase Caldwell MRN: [id number] Date of Birth: December 05, 1980  Today's Date: 08/05/2019 PT Individual Time: 1435-1535 PT Individual Time Calculation (min): 60 min   Short Term Goals: Week 3:  PT Short Term Goal 1 (Week 3): Patient will initiate power w/c mobility. PT Short Term Goal 2 (Week 3): Patient will perform bed mobility with max A +1 consistently. PT Short Term Goal 3 (Week 3): Patient will perform basic transfer with max A +1.  Skilled Therapeutic Interventions/Progress Updates:     Patient in bed with his parents at bedside upon PT arrival. Patient alert and agreeable to PT session. Patient reported intermittent wrist pain and pressure at his R shin in the cast and discomfort from cast at proximal end during session, RN made aware and casting OT made aware, discussing cast removal and plan this afternoon. PT provided repositioning, rest breaks, and distraction as pain interventions throughout session. Upon entry patient low in the bed with all extremities and trunk in flexed position with visible muscle jerks promoting increased flexor tone throughout. Repositioning patient in the bed with mild improvement in flexor tone and positioning. Placed cloth cover over proximal end of cast to maintain skin integrity and improve patient comfort during session until determination is made for cast removal.   Patient and his parents with several questions about medications, UTI, PEG foley, and POC. Discussed with Benjie Karvonen, LPN and Pam, PA. Updated patient and his relayed information to patient and his parents and education on speaking to patient's medical team or RN about further questions regarding these concerns.   Therapeutic Activity: Bed Mobility: Patient performed supine to sit with mod A of 1 person for trunk management and scooting hips forward in the bed and mod A +2 for sit to supine. Provided verbal cues for use of UEs to push himself up into  sitting and LE management with mobility. Patient sat EOB >5 min with min A-close supervision as R post-op shoes was donned over cast and L tennis shoe was donned with total A. Performed scooting up in bed with total A +2 x2 with bed placed in trendelenburg.  Transfers: Patient performed sit to/from stand x2 with mod-max A +2 with 1 additional attempt without completing a full stand due to patient pushing posteriorly with B LE's when attempting to stand. Provided verbal cues for forward weight shift, knee and hip extension, and foot placement. B knees blocked and R foot blocked for safety in standing. Patient stood 20-45 sec each trial with cues for erect posture and hip/knee extension in standing. Patient denied pain in standing with R LE cast.  Patient required increased time for all mobility today due to increased systemic tone, RN and PA made aware.   Patient in bed with his parents in the room at end of session with breaks locked, bed rails padded and blocked for patient's safety, and all needs within reach.    Therapy Documentation Precautions:  Precautions Precautions: Fall, Other (comment) Precaution Comments: PEG, significant hypertonia, right ankle PF contracture Required Braces or Orthoses: Other Brace Other Brace: abdominal binder (to keep PEG safe) Restrictions Weight Bearing Restrictions: No    Therapy/Group: Individual Therapy  Cabela Pacifico L Louvinia Cumbo PT, DPT  08/05/2019, 6:06 PM

## 2019-08-05 NOTE — Progress Notes (Signed)
Pharmacy Antibiotic Note  Chase Caldwell is a 39 y.o. male admitted on 07/19/2019 with UTI.  Pharmacy has been consulted for Zosyn dosing. Patient is afebrile, WBC WNL.   Plan: Zosyn 3.375g IV q8h (4 hour infusion).  Monitor clinical status, length of treatment  Height: 6' (182.9 cm) Weight:  (attempted weight but bed kept signaling errors) IBW/kg (Calculated) : 77.6  Temp (24hrs), Avg:98.4 F (36.9 C), Min:97.9 F (36.6 C), Max:98.7 F (37.1 C)  Recent Labs  Lab 08/01/19 0600 08/02/19 1507 08/05/19 0630  WBC  --  7.5 8.9  CREATININE 0.70 0.70 0.79    Estimated Creatinine Clearance: 161.6 mL/min (by C-G formula based on SCr of 0.79 mg/dL).    No Known Allergies  Antimicrobials this admission: Fosfomycin 7/23 >> x1 Zosyn 7/26 >>   Dose adjustments this admission: N/A  Microbiology results: 7/20 Ucx >> 100k klebsiella oxytoca (R - amp, R - cefaz), 100k pseudomonas (R - imipenem)  Thank you for allowing pharmacy to be a part of this patient's care.  Romilda Garret, PharmD PGY1 Acute Care Pharmacy Resident Phone: [phone] 08/05/2019 9:19 AM  Please check [email or web address] for unit specific pharmacy phone numbers.

## 2019-08-05 NOTE — Progress Notes (Signed)
[facility] PHYSICAL MEDICINE & REHABILITATION PROGRESS NOTE  Subjective/Complaints: Patient seen laying in bed this morning.  He states he did not sleep well overnight.  Overnight, called by nursing regarding fall.  No trauma noted.  Called several times yesterday regarding spasms.  ROS: Denies CP, SOB, N/V/D  Objective: Vital Signs: Blood pressure (!) 135/94, pulse 102, temperature 98.2 F (36.8 C), resp. rate 20, height 6' (1.829 m), weight (!) 118.6 kg, SpO2 98 %. No results found. Recent Labs    08/02/19 1507 08/05/19 0630  WBC 7.5 8.9  HGB 12.8* 14.4  HCT 39.9 44.7  PLT 368 371   Recent Labs    08/02/19 1507 08/05/19 0630  NA 140 138  K 4.2 4.0  CL 106 102  CO2 25 25  GLUCOSE 113* 104*  BUN 10 13  CREATININE 0.70 0.79  CALCIUM 9.3 9.7    Physical Exam: BP (!) 135/94 (BP Location: Left Arm)   Pulse 102   Temp 98.2 F (36.8 C)   Resp 20   Ht 6' (1.829 m)   Wt (!) 118.6 kg   SpO2 98%   BMI 35.46 kg/m  Constitutional: No distress . Vital signs reviewed. HENT: Normocephalic.  Atraumatic. Eyes: EOMI. No discharge. Cardiovascular: No JVD.  RRR. Respiratory: Normal effort.  No stridor.  Bilateral clear to auscultation GI: Non-distended..  BS +.  + Foley in PEG site. Skin: Warm and dry.  Intact. Psych: Restless Musc: Right foot casted No edema in extremities.   Neuro: Alert Facial paralysis, stable Vision remains limited Significant generalized extensor tone  Motor: Limited due to tone, however moving all extremities  Assessment/Plan: 1. 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  Physiatrist is providing close team supervision and 24 hour management of active medical problems listed below.  Physiatrist and rehab team continue to assess barriers to discharge/monitor patient progress toward functional and medical  goals  Care Tool:  Bathing        Body parts bathed by helper: Right arm, Left arm, Chest, Abdomen, Front perineal area, Buttocks, Right upper leg, Left upper leg, Right lower leg, Left lower leg, Face     Bathing assist Assist Level: 2 Helpers     Upper Body Dressing/Undressing Upper body dressing        Upper body assist Assist Level: Total Assistance - Patient < 25%    Lower Body Dressing/Undressing Lower body dressing      What is the patient wearing?: Pants     Lower body assist Assist for lower body dressing: 2 Helpers     Toileting Toileting Toileting Activity did not occur Landscape architect and hygiene only): Refused  Toileting assist Assist for toileting: 2 Helpers     Transfers Chair/bed transfer  Transfers assist  Chair/bed transfer activity did not occur: Safety/medical concerns (safety/medical b/c we introduced new technique??)  Chair/bed transfer assist level: 2 Helpers     Locomotion Ambulation   Ambulation assist   Ambulation activity did not occur: Safety/medical concerns          Walk 10 feet activity   Assist  Walk 10 feet activity did not occur: Safety/medical concerns        Walk 50 feet activity   Assist Walk 50 feet with 2 turns activity did not occur: Safety/medical concerns         Walk 150 feet activity   Assist Walk  150 feet activity did not occur: Safety/medical concerns         Walk 10 feet on uneven surface  activity   Assist Walk 10 feet on uneven surfaces activity did not occur: Safety/medical concerns         Wheelchair     Assist Will patient use wheelchair at discharge?:  (likely will require dependent w/c transport)      Wheelchair assist level: Dependent - Patient 0%      Wheelchair 50 feet with 2 turns activity    Assist        Assist Level: Dependent - Patient 0%   Wheelchair 150 feet activity     Assist     Assist Level: Dependent - Patient 0%       Medical Problem List and Plan: 1. Diffuse weakness with hypertonicity and deficits with self-care, swallowing, cognition secondary to TBI with significant Psych history.  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   Per neurology, repeat imaging ~8/2, labs ordered-folate within normal limits, vitamin B12-low normal, magnesium remains pending   B12 supplementation initiated  Chest x-ray personally reviewed, unremarkable for acute infectious process. 2.  Antithrombotics: -DVT/anticoagulation:  Pharmaceutical: Lovenox             -antiplatelet therapy: N/a 3. 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   Hydrocodone in place of tramadol for severe pain  Rx tone as below  Appears controlled on 7/26 4. Mood/agitation/hx of bipolar disorder: Neuropsych and psychiatry to follow during his stay. LCSW to follow for evaluation and support             -antipsychotic agents:  seroquel  -continue cymbalta, zoloft  -added gabapentin as above  -ativan BID discontinued.  -psych also following this patient although hasn't seen for some time  See # 14 5. Neuropsych: This patient is not fully capable of making decisions on his own behalf. 6. Skin/Wound Care: Routine pressure relief measures.  7. Fluids/Electrolytes/Nutrition:  11. Facial Fx/Maxillary fracture s/p repair with associated dysphagia: Wires removed by ENT             Dysphagia 1 thin diet--advance per SLP    12. ABLA:   Hemoglobin 12.8 on 7/19, labs ordered  Continue to monitor 13. Prediabetes: Hyperglycemia due to tube feeds. Continue feeds at nights as intake remains variable.    Dc cbg checks/SSI  14.  Severe muscle spasms with extensor tone:     Baclofen increased to 10 3 times daily on 7/11 without much effect, increased to 20 3 times  daily on 7/26  7/16 Dantrium trial without benefit, DC'd   7/21--  solid cast on patient per OT  -continue ROM with therapies, serial casting   Patient initially stated that [place] had incomplete benefit and was interested in changing to Valium, change made with patient noting further improvement, however significant increase in tone over the last couple days.  Suspect this is not related to medication and related to simultaneous UTI.  Per family request, changed back to Klonopin. 15.  Tachycardia:  Continue propanolol which was increased to 40 3 times daily on 7/11  See #14, 18 16.  Transaminitis  ALT remains elevated on 7/23, continue to monitor 17.  Hypokalemia  Potassium 4.0 on 7/26  Continue supplementation 18.  Hypertension  Lisinopril 10 started on 7/21  Propanolol 40 3 times daily, increased to 60 3 times daily on 7/24  Elevated diastolic pressures on 2/22, consider further medication adjustment if persistent after treatment of infectious process 19.  Acute lower UTI with Klebsiella and Pseudomonas  Treated with fosfomycin, suspect incomplete treatment  IV Zosyn started on 7/26 20.  Episode of shortness of breath  Repeat chest x-ray ordered   >35 minutes spent in total and counseling and coordination of care, as well as managing severe spasms/tone, fall, UTI, etc. LOS: 17 days A FACE TO FACE EVALUATION WAS PERFORMED  Kamille Toomey Lorie Phenix 08/05/2019, 8:43 AM

## 2019-08-05 NOTE — Progress Notes (Signed)
Physical Therapy Session Note  Patient Details  Name: Chase Caldwell MRN: [id number] Date of Birth: [birth year]  Today's Date: 08/05/2019 PT Individual Time: 1300-1340 PT Individual Time Calculation (min): 40 min   Short Term Goals:  Week 2:  PT Short Term Goal 1 (Week 2): Patient will perform supine to sit with max A +1 consistently. PT Short Term Goal 1 - Progress (Week 2): Met PT Short Term Goal 2 (Week 2): Patient will perform sit to supine with mod A +2. PT Short Term Goal 2 - Progress (Week 2): Progressing toward goal PT Short Term Goal 3 (Week 2): Patient will perform basic transfers with mod A +2. PT Short Term Goal 3 - Progress (Week 2): Progressing toward goal PT Short Term Goal 4 (Week 2): Patient will initiate power w/c mobility. PT Short Term Goal 4 - Progress (Week 2): Progressing toward goal  Skilled Therapeutic Interventions/Progress Updates:    Session focused on postural control and functional dynamic sitting balance, slideboard transfer back to bed, and positioning in the bed (sidelying, prone and then supine) with focus on tone management, safe positioning, and pt performing mobility as able volitionally. Max assist for anterior and lateral weighshifts to prepare for transfer, place and remove slideboard with multimodal cues. Total +2 for slideboard with multiple starts and stops needed for repositioning of BLE and BUE (especially L due to spasms and uncontrolled movement) due to tone overall. Total +2 for return to supine and in sidelying. Offered and pt agreeable to get into prone to decrease spasms and stretch with +2 assist transitioned with extra time but pt states he would prefer to return to supine and becoming easily frustrated and impatient. Encouraged that we cannot move him fast due to all the spasms and tone for safety and must maintain joint integrity. Parents were present and observed during session, appreciative of attempts for mobility. End of session left in care  of RN with HOB elevated and pillows positioned for safety and to limit impact on bedrails of LUE especially, with RN to administer medication. .   Therapy Documentation Precautions:  Precautions Precautions: Fall, Other (comment) Precaution Comments: PEG, significant hypertonia, right ankle PF contracture Required Braces or Orthoses: Other Brace Other Brace: abdominal binder (to keep PEG safe) Restrictions Weight Bearing Restrictions: No   Pain: Reports discomfort on his "ass" from up in w/c and then fluctuating pain in BLE and LUE during mobility. RN present to administer pain medication and spasm meds at end of session.    Therapy/Group: Individual Therapy  Canary Brim Ivory Broad, PT, DPT, CBIS  08/05/2019, 1:44 PM
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
## 2019-08-05 NOTE — Progress Notes (Signed)
Orthopedic Tech Progress Note Patient Details:  Chase Caldwell 1980-06-11 [id number]  Casting Type of Cast: Short leg cast Cast Location: rle Cast Material: Fiberglass Cast Intervention: Bivalve Bi valved at rns request Post Interventions Patient Tolerated: Well Instructions Provided: Care of device, Adjustment of device     Karolee Stamps 08/05/2019, 7:54 PM

## 2019-08-05 NOTE — Progress Notes (Signed)
Spoke with primary nurse concerning IV placement. Over his 16 day stay, he has removed 10 PIVs and one Midline. One was removed this morning that was placed at 1119 and consult was placed at 0957. He has Zosyn ordered IV, which is a four hour infusion, and something IV for sedation. I requested the primary nurse to contact the primary physician to see if some alternative routes and /or medications can be utilized. Each venous puncture causes damage, he will eventually run out of areas suitable to access, and he runs a risk of infection for each access, thus increasing his LOS.

## 2019-08-06 ENCOUNTER — Inpatient Hospital Stay (HOSPITAL_COMMUNITY): Payer: Medicaid Other

## 2019-08-06 LAB — MISC LABCORP TEST (SEND OUT): Labcorp test code: 724195

## 2019-08-06 NOTE — Progress Notes (Signed)
Patient ID: Chase Caldwell, male   DOB: May 28, 1980, 39 y.o.   MRN: [id number]  SW met with pt and pt parents in room to provide updates from team conference, and d/c date remaining the same. SW discussed family education (multiple). Will discus further with his wife to get an idea.   Loralee Pacas, MSW, [place] Office: [phone] Cell: [phone] Fax: [phone]

## 2019-08-06 NOTE — Patient Care Conference (Signed)
Inpatient RehabilitationTeam Conference and Plan of Care Update Date: 08/06/2019   Time: 10:23 AM    Patient Name: Chase Caldwell      Medical Record Number: [id number]  Date of Birth: 21-Aug-1980 Sex: Male         Room/Bed: 4W07C/4W07C-01 Payor Info: Payor: MEDICAID [place] / Plan: MEDICAID [place] ACCESS / Product Type: *No Product type* /    Admit Date/Time:  07/19/2019  3:13 PM  Primary Diagnosis:  TBI (traumatic brain injury) [facility])  Hospital Problems: Principal Problem:   TBI (traumatic brain injury) ([place]) Active Problems:   Transaminitis   Tachycardia   Elevated blood pressure reading   Muscle spasm   Chronic pain syndrome   Hypokalemia   Essential hypertension   Abnormal increased muscle tone   Shortness of breath   Acute lower UTI    Expected Discharge Date: Expected Discharge Date: 08/17/19  Team Members Present: Physician leading conference: Dr. Leeroy Cha Care Coodinator Present: Loralee Pacas, LCSWA;Omri Bertran Creig Hines, RN, BSN, [place] Nurse Present: Other (comment) Suezanne Cheshire, RN) PT Present: Apolinar Junes, PT OT Present: Laverle Hobby, OT SLP Present: Jettie Booze, CF-SLP PPS Coordinator present : Ileana Ladd, Burna Mortimer, SLP     Current Status/Progress Goal Weekly Team Focus  Bowel/Bladder   Pt is incontinent of bowel/bladder.  To become more continent  Assess tolieting needs q2 or often.   Swallow/Nutrition/ Hydration   Dys 1 textures, thin liquids, Min A  Supervision  tolerance current diet, independence wtih use of compensatory strategies   ADL's   Increase in tone and decrease in volitional movement last couple of days. Max- total A +2 transfers  mod A overall of one caregiver  Tone management, contracture management, OOB tolerance, ADLs, transfers   Mobility   Fluctuating levels in mobility due to systemic tone/spasms, mod-total A +2 bed mobility, SBT and stand pivot transfers, and sit to/from stand  mod A overall  Functional mobility, power  w/c mobility, activity tolerance, tone and pain management, R DF tone with serial casting, patient/caregiver education, d/c planning   Communication   Min A  Mod I  use of speech intelligibility strategies   Safety/Cognition/ Behavioral Observations            Pain   Pt has had chronic back pain rated at 10, he has prn pain meds if needed.  To keep pain level below 3/10.  Assess pain q shift or prn.   Skin   Has ecchymosis to abdomen  To prevent skin breakdown  Assess skin q shift or prn.     Team Discussion:  Discharge Planning/Teaching Needs:  24/7 care from wife and his parents who are here from [place] to assist with providing care.  Family education as recommended   Current Update:  None  Current Barriers to Discharge:  Tone and wheelchair mobility  Possible Resolutions to Barriers: Continue working on tone with stretching and medications. Continue working on patient wheelchair mobility, regular wheelchair vs. Theatre manager.  Patient on target to meet rehab goals: yes, Today was a better day with transfer and participate better. Continue casting right lower foot to assist with tone. Hoyer lift recommended for family at discharge. SLP progressing towards discharge goals.  *See Care Plan and progress notes for long and short-term goals.   Revisions to Treatment Plan Power wheelchair eval pending.    Medical Summary Current Status: Repeat MRI brain pending, tone improved today, on IV Zofran for UTI, cast removed today, cooperating well with therapy, dysphagia 1 diet  with thin liquids Weekly Focus/Goal: Continue Baclofen, discuss with neurology once MRI results return, education of family regarding mobility/care/diet  Barriers to Discharge: Medical stability;Decreased family/caregiver support;Home enviroment access/layout  Barriers to Discharge Comments: impaired cognition, may need Hoyer lift prior to DC home, needs 24/7 supervision at home. Possible Resolutions to Barriers:  Family training, follow-up with neurology once MRI results available, power wheelchair evaluation today   Continued Need for Acute Rehabilitation Level of Care: The patient requires daily medical management by a physician with specialized training in physical medicine and rehabilitation for the following reasons: Direction of a multidisciplinary physical rehabilitation program to maximize functional independence : Yes Medical management of patient stability for increased activity during participation in an intensive rehabilitation regime.: Yes Analysis of laboratory values and/or radiology reports with any subsequent need for medication adjustment and/or medical intervention. : Yes   I attest that I was present, lead the team conference, and concur with the assessment and plan of the team.   Cristi Loron 08/06/2019, 1:43 PM

## 2019-08-06 NOTE — Progress Notes (Addendum)
[place] PHYSICAL MEDICINE & REHABILITATION PROGRESS NOTE  Subjective/Complaints: No complaints this morning. Laughing watching TV [name] cut of rest of cast.  Diastolic little elevated, systolic well controlled.   ROS: Denies CP, SOB, N/V/D  Objective: Vital Signs: Blood pressure (!) 127/92, pulse 79, temperature 98.4 F (36.9 C), temperature source Axillary, resp. rate 20, height 6' (1.829 m), weight (!) 118.6 kg, SpO2 98 %. DG Chest 2 View  Result Date: 08/05/2019 CLINICAL DATA:  Abnormal increased muscle tone EXAM: CHEST - 2 VIEW COMPARISON:  08/02/2019 FINDINGS: Heart is normal size. Linear atelectasis or scarring in the left lower lung. Right lung clear. No effusions or acute bony abnormality. IMPRESSION: No active cardiopulmonary disease. Electronically Signed   By: Rolm Baptise M.D.   On: 08/05/2019 10:39   Recent Labs    08/05/19 0630  WBC 8.9  HGB 14.4  HCT 44.7  PLT 371   Recent Labs    08/05/19 0630  NA 138  K 4.0  CL 102  CO2 25  GLUCOSE 104*  BUN 13  CREATININE 0.79  CALCIUM 9.7    Physical Exam: BP (!) 127/92 (BP Location: Right Arm)   Pulse 79   Temp 98.4 F (36.9 C) (Axillary)   Resp 20   Ht 6' (1.829 m)   Wt (!) 118.6 kg   SpO2 98%   BMI 35.46 kg/m  Constitutional: No distress . Vital signs reviewed. HENT: Normocephalic.  Atraumatic. Eyes: EOMI. No discharge. Cardiovascular: No JVD.  RRR. Respiratory: Normal effort.  No stridor.  Bilateral clear to auscultation GI: Non-distended..  BS +.  + Foley in PEG site. Skin: Warm and dry.  Intact. Psych: Restless Musc: Right foot casted    General: Alert and oriented x 3, No apparent distress HEENT: Head is normocephalic, atraumatic, PERRLA, EOMI, sclera anicteric, oral mucosa pink and moist, dentition intact, ext ear canals clear,  Neck: Supple without JVD or lymphadenopathy Heart: Reg rate and rhythm. No murmurs rubs or gallops Chest: CTA bilaterally without wheezes, rales, or rhonchi; no  distress Abdomen: Soft, non-tender, non-distended, bowel sounds positive. Skin: Clean and intact without signs of breakdown No edema in extremities.   Neuro: Alert Facial paralysis, stable Vision remains limited Significant generalized extensor tone  Motor: Limited due to tone, however moving all extremities   Assessment/Plan: 1. 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  Physiatrist is providing close team supervision and 24 hour management of active medical problems listed below.  Physiatrist and rehab team continue to assess barriers to discharge/monitor patient progress toward functional and medical goals  Care Tool:  Bathing        Body parts bathed by helper: Right arm, Left arm, Chest, Abdomen, Front perineal area, Buttocks, Right upper leg, Left upper leg, Right lower leg, Left lower leg, Face     Bathing assist Assist Level: 2 Helpers     Upper Body Dressing/Undressing Upper body dressing        Upper body assist Assist Level: Total Assistance - Patient < 25%    Lower Body Dressing/Undressing Lower body dressing      What is the patient wearing?: Pants     Lower body assist Assist for lower body dressing: 2 Helpers     Toileting Toileting Toileting Activity did not occur Landscape architect and hygiene only): Refused  Toileting assist Assist for toileting: 2 Helpers     Transfers  Chair/bed transfer  Transfers assist  Chair/bed transfer activity did not occur: Safety/medical concerns (safety/medical b/c we introduced new technique??)  Chair/bed transfer assist level: 2 Helpers     Locomotion Ambulation   Ambulation assist   Ambulation activity did not occur: Safety/medical concerns          Walk 10 feet activity   Assist  Walk 10 feet activity did not occur: Safety/medical concerns        Walk  50 feet activity   Assist Walk 50 feet with 2 turns activity did not occur: Safety/medical concerns         Walk 150 feet activity   Assist Walk 150 feet activity did not occur: Safety/medical concerns         Walk 10 feet on uneven surface  activity   Assist Walk 10 feet on uneven surfaces activity did not occur: Safety/medical concerns         Wheelchair     Assist Will patient use wheelchair at discharge?:  (likely will require dependent w/c transport)      Wheelchair assist level: Dependent - Patient 0%      Wheelchair 50 feet with 2 turns activity    Assist        Assist Level: Dependent - Patient 0%   Wheelchair 150 feet activity     Assist     Assist Level: Dependent - Patient 0%      Medical Problem List and Plan: 1. Diffuse weakness with hypertonicity and deficits with self-care, swallowing, cognition secondary to TBI with significant Psych history.  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   Per neurology, repeat imaging ~8/2, labs ordered-folate within normal limits, vitamin B12-low normal, magnesium remains pending   B12 supplementation initiated  Chest x-ray personally reviewed, unremarkable for acute infectious process. 2.  Antithrombotics: -DVT/anticoagulation:  Pharmaceutical: Lovenox             -antiplatelet therapy: N/a 3. 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   Hydrocodone in place of tramadol for severe pain  Rx tone as below  Appears controlled on 7/27 4. Mood/agitation/hx of bipolar disorder: Neuropsych and psychiatry to follow during his stay. LCSW to follow for evaluation and support             -antipsychotic agents:  seroquel  -continue cymbalta, zoloft  -added gabapentin as  above  -ativan BID discontinued.  -psych also following this patient although hasn't seen for some time  See # 14 5. Neuropsych: This patient is not fully capable of making decisions on his own behalf. 6. Skin/Wound Care: Routine pressure relief measures.  7. Fluids/Electrolytes/Nutrition:  11. Facial Fx/Maxillary fracture s/p repair with associated dysphagia: Wires removed by ENT             Dysphagia 1 thin diet--advance per SLP    12. ABLA:   Hemoglobin 12.8 on 7/19, 7/26 labs stable  Continue to monitor 13. Prediabetes: Hyperglycemia due to tube feeds. Continue feeds at nights as intake remains variable.    Dc cbg checks/SSI  14.  Severe muscle spasms with extensor tone:     Baclofen increased to 10 3 times daily on 7/11 without much effect, increased  to 20 3 times daily on 7/26- tone is improved on 7/27  7/16 Dantrium trial without benefit, DC'd   7/21--  solid cast on patient per OT  -continue ROM with therapies, serial casting   Patient initially stated that [place] had incomplete benefit and was interested in changing to Valium, change made with patient noting further improvement, however significant increase in tone over the last couple days.  Suspect this is not related to medication and related to simultaneous UTI.  Per family request, changed back to Klonopin. 15.  Tachycardia:  Continue propanolol which was increased to 40 3 times daily on 7/11  See #14, 18 16.  Transaminitis  ALT remains elevated on 7/23, continue to monitor 17.  Hypokalemia  Potassium 4.0 on 7/26  Continue supplementation 18.  Hypertension  Lisinopril 10 started on 7/21  Propanolol 40 3 times daily, increased to 60 3 times daily on 7/24  Elevated diastolic pressures on 7/51, consider further medication adjustment if persistent after treatment of infectious process 19.  Acute lower UTI with Klebsiella and Pseudomonas  Treated with fosfomycin, suspect incomplete treatment  IV Zosyn started on 7/26 20.   Episode of shortness of breath  Repeat chest x-ray ordered- stable  LOS: 18 days A FACE TO FACE EVALUATION WAS PERFORMED  Chase Caldwell Chase Caldwell 08/06/2019, 8:33 AM

## 2019-08-06 NOTE — Progress Notes (Signed)
Occupational Therapy Session Note  Patient Details  Name: Chase Caldwell MRN: [id number] Date of Birth: 10-Dec-1980  Today's Date: 08/06/2019 OT Individual Time: 1355-1455 OT Individual Time Calculation (min): 60 min    Short Term Goals: Week 3:  OT Short Term Goal 1 (Week 3): Pt will sit upright OOB for 3 hours to improve OOB tolerance OT Short Term Goal 2 (Week 3): Pt will thread 1UE into shirt wiht MIN A OT Short Term Goal 3 (Week 3): Pt will sit EOB with S overall OT Short Term Goal 4 (Week 3): Pt will complete transfer to w/c with LRAD and MAX A of 1 caregover consistently in prep for OOB toileting  Skilled Therapeutic Interventions/Progress Updates:    Pt received supine with no c/o pain, parents present. Pt agreeable to therapy session. Pt completed slideboard transfer to w/c with x2 attempts d/t poor slideboard placement initially. Once slideboard correctly under pt he was able to transfer to TIS with mod A of 1, +2 present for safety! Pt was taken down to the therapy gym via [name]. Trialed several different potential types of controls for a power chair with the RUE. Pt benefits from the control being at midline for maximal distal control. Based off of this trial, do not feel pt is safe using a hand control with a power chair for safety of himself/others. Will continue trialing different options as well as exploring head/mouth control. Pt then completed closed chain functional reaching with LUE bound to bat to complete shoulder raises and chest press. Max facilitation required at his L wrist for neutral positioning, with extensor tone present. Pt returned to his room and requested to get back into bed. Slideboard transfer back to bed, slightly uphill with max A. Pt left sidelying, asleep as soon as he was positioned with no spasming. Parents present.   Therapy Documentation Precautions:  Precautions Precautions: Fall, Other (comment) Precaution Comments: PEG, significant hypertonia,  right ankle PF contracture Required Braces or Orthoses: Other Brace Other Brace: abdominal binder (to keep PEG safe) Restrictions Weight Bearing Restrictions: No   Therapy/Group: Individual Therapy  [name] 08/06/2019, 6:34 AM

## 2019-08-06 NOTE — Progress Notes (Signed)
Physical Therapy Session Note  Patient Details  Name: Chase Caldwell MRN: [id number] Date of Birth: [birth year]  Today's Date: 08/06/2019 PT Individual Time: 0800-0900 and 1884-1660 PT Individual Time Calculation (min): 60 min and 83 min  Short Term Goals: Week 3:  PT Short Term Goal 1 (Week 3): Patient will initiate power w/c mobility. PT Short Term Goal 2 (Week 3): Patient will perform bed mobility with max A +1 consistently. PT Short Term Goal 3 (Week 3): Patient will perform basic transfer with max A +1.  Skilled Therapeutic Interventions/Progress Updates:     Session 1: Patient in bed upon PT arrival. Patient alert and agreeable to PT session. Patient denied pain during session. Noted R LE cast was cut, but left on with ACE wrap around it. Doffed ACE wrap and noted external cast was cut, but internal padding remained intact. PT cut padding and removed cast. Upon thorough inspection of patient's R LE, patient's skin was healthy, intact, and without sings of breakdown. Noted patient lacking 18 degrees from neutral PROM on R ankle without stretching prior to measurement. Plan for re-casting to continue serial casting tomorrow with Ellard Artis, OT. Noted improved systemic tone this session. MD rounded during session and updated on patient status and progress with serial casting.   Therapeutic Activity: Bed Mobility: Patient performed rolling L x2 with min-mod A with use of bed rail. Donned shorts bed level with total A performing bridging x2. He performed supine to sit with min-mod A for trunk support and sliding hip EOB. Provided verbal cues for pushing through B elbows to sit up once B LEs were off the bed and head-hips relationship for reciprocal scooting EOB. Donned B tennis shoes with patient sitting EOB with SBA for sitting balance and multimodal cues to correct mild posterior bias. Transfers: Patient performed sit to/from stand x2 with max A +2 and PT blocking R foot and knee to reduce sliding and  inversion at the ankle in standing. Provided verbal cues for forward weight shift and hip and knee extension to come to standing. Stood 20-30 sec both trials with mod-max A +2 to maintain standing balance.  Patient performed a slide board transfer bed>[name] with mod A of 1 person with a second person SBA for safety and total A for board placement. Provided cues for hand placement, board placement, and head-hips relationship for proper technique and decreased assist with transfers.   Patient in [place] w/c at the nurses station with seat belt fasened at end of session with breaks locked and all needs within reach.   Session 2: Patient in [name] at nurses station upon PT arrival. Patient alert and agreeable to PT session. Patient denied pain during session. Jason Persons, [name] from [employer] and [employer] from [place] present for power w/c trial.   Transferred patient from [employer] w/c mod-max +2 using slide board with cues for technique throughout to FWD power w/c with seat elevator and standing option. Trialed standing feature of power chair with use of chest strap, B knee blocks, lateral thigh supports, and foot plate placed in slight PF position to accommodate for R>L PF tone in B LEs. Programed chair to perform sit>stand from seated versus lying to standing to reduced activation of LE flexor tone in supine position. Noted increased PF with inversion on R ankle in standing. PT provided facilitation to improve positioning of patient's foot on foot plate during standing. Made adjustments to knee blocks to improve foot positioning and discussed use of foot orthotics  and bracing to assist positioning of patient's foot in standing. Trialed standing feature in power w/c again with improved positioning with increased patient comfort in standing. Patient able to reach grossly 90 degrees of shoulder flexion in standing with intermittent accuracy with target finding using UEs.   Power chair with standard  joystick hand control. Patient with difficulty locating joystick using R UE with R lateral hand control placement due to B vision loss and UE tone. Able to perform turning L x2, unable to propel forward this session. Performed target finding and hand control motions (forward, backwards, right, left) at midline while holding onto [name]'s finger. Patient able to locate target and perform hand motions in all directions following cues if maintained at midline with R UE. Discussed trial with centrally located hand controls with alternate grip support for joystick. Plan to trail power w/c propulsion with these alterations another date when equipment is available.    Also assessed patient's cervical ROM, WFL with volitional control, and ability to volitionally use a sip/puff control feature. Discussed barriers to these control features due to need for visual guided selection of w/c functions using these controls.   Discussed focus of functional hand movement for ADLs, IADLs, and management of power w/c controls during therapy sessions proir to next power w/c trial. Transferred patient back to [name] using slide board with mod-max A +2 and cues for technique. Patient trasnported back to his room total A. Patient in [place] w/c with his parents in the room, RN made aware, at end of session with breaks locked and all needs within reach.    Therapy Documentation Precautions:  Precautions Precautions: Fall, Other (comment) Precaution Comments: PEG, significant hypertonia, right ankle PF contracture Required Braces or Orthoses: Other Brace Other Brace: abdominal binder (to keep PEG safe) Restrictions Weight Bearing Restrictions: No    Therapy/Group: Individual Therapy  Dann Galicia L Jarod Bozzo PT, DPT  08/06/2019, 12:34 PM

## 2019-08-06 NOTE — Progress Notes (Signed)
[place] and thiamine levels still pending.   Repeat MRI brain should be obtained on Aug 2.   Neurology will sign off for now. Please call back if above levels are abnormal and/or after repeat MRI brain is completed.   Electronically signed: Dr. Kerney Elbe

## 2019-08-07 ENCOUNTER — Inpatient Hospital Stay (HOSPITAL_COMMUNITY): Payer: Medicaid Other

## 2019-08-07 ENCOUNTER — Inpatient Hospital Stay (HOSPITAL_COMMUNITY): Payer: Medicaid Other | Admitting: Speech Pathology

## 2019-08-07 NOTE — Progress Notes (Signed)
Occupational Therapy Session Note  Patient Details  Name: Chase Caldwell MRN: [id number] Date of Birth: 1980-08-02  Today's Date: 08/07/2019 OT Individual Time: 1045-1150 OT Individual Time Calculation (min): 65 min    Short Term Goals: Week 3:  OT Short Term Goal 1 (Week 3): Pt will sit upright OOB for 3 hours to improve OOB tolerance OT Short Term Goal 2 (Week 3): Pt will thread 1UE into shirt wiht MIN A OT Short Term Goal 3 (Week 3): Pt will sit EOB with S overall OT Short Term Goal 4 (Week 3): Pt will complete transfer to w/c with LRAD and MAX A of 1 caregover consistently in prep for OOB toileting  Skilled Therapeutic Interventions/Progress Updates:    Pt received supine with no c/o pain, excited to take shower. Pt completed bed mobility with mod A to lift trunk once semi-sidelying. Pt required fluctuating CGA-mod A for sitting balance once EOB. Pt completed slideboard transfer to roll in shower chair with max +2 assist, 3rd person present for support of equipment. Total A +2 to doff all clothing. Pt was rolled into shower total A. Wash mit donned on pt's RUE and he was given max facilitation to attempt and wash LUE/chest. Total A for washing LB. Attempted multiple times to reposition pt's hips back in the shower chair, with LB extensor tone preventing any repositioning. 3rd person entered for safety. Maximove sling was obtained and positioned under pt with total +3 assist. Pt was transferred back to bed via lift. Pt scooted up in bed with max +2 assist. Brief donned by rolling R and L, max A. Pt left supine, now sleeping. Pt was c/o pain at end of shower, saying the seat was hurting his butt. After transfer back to bed pt reported pain was alleviated.   Therapy Documentation Precautions:  Precautions Precautions: Fall, Other (comment) Precaution Comments: PEG, significant hypertonia, right ankle PF contracture Required Braces or Orthoses: Other Brace Other Brace: abdominal binder (to keep  PEG safe) Restrictions Weight Bearing Restrictions: No   Therapy/Group: Individual Therapy  [name] 08/07/2019, 6:36 AM

## 2019-08-07 NOTE — Progress Notes (Signed)
Physical Therapy Session Note  Patient Details  Name: Chase Caldwell MRN: [id number] Date of Birth: 08-Jan-1981  Today's Date: 08/07/2019 PT Individual Time: 6387-5643 and 1400-1445 PT Individual Time Calculation: 80 min and 45 min  Short Term Goals: Week 3:  PT Short Term Goal 1 (Week 3): Patient will initiate power w/c mobility. PT Short Term Goal 2 (Week 3): Patient will perform bed mobility with max A +1 consistently. PT Short Term Goal 3 (Week 3): Patient will perform basic transfer with max A +1.  Skilled Therapeutic Interventions/Progress Updates:     Session 1: Patient in bed upon PT arrival. Patient alert and agreeable to PT/OT co-treatment session for R LE casting. Patient reported significant PED foley site pain during session while in prone position, RN made aware and provided pain medicine during session. PT provided repositioning, rest breaks, and distraction as pain interventions throughout session.   Therapeutic Activity: Bed Mobility: Patient performed lateral scooting in the bed with max-mod A +2 with cues for bridging and sequencing. He performed supine to/from prone x2 with max A +2. Provided verbal cues for bending B LEs and placing arms overhead while rolling for improved management with UE tone. Placed red wedge and 2 pillows under his pelvis/chest and 1 pillow under his head on first trial. Patient crying out in pain due to PEG foley site, returned to supine, PEG site without drainage, applied gel pad under tube to reduce pressure and secured tube with tape and abdominal binder. RN provided pain medicine, then patient returned to prone with only pillows under pelvis/chest for improved comfort during casting. Patient complained of PEG site pain progressively during casting, RN made aware.   PT applied DF stretch to R foot, lacking 18 degrees from neutral prior to casting. Applied stretch throughout padding and casting by OT, see OT note for details.   [name] parents  arrived during casting. Patient and family education on casting purpose and monitoring for pain/pressure while patient is wearing the cast and to call for staff to have it removed if pain/pressure persists.   Patient in bed at end of session with breaks locked, bed alarm set, and all needs within reach.   Session 2: Patient in bed with his parents at bedside upon PT arrival. Patient alert and agreeable to PT session. Patient denied pain during session, reported no pain/pressure from R LE cast. Skin integrity intact upon inspection of toes and proximal calf/shin. Patient reported increased fatigue this afternoon and that he had just found a comfortable position in the bed.   Focused session on d/c planning and management of expectations for mobility at d/c. Discussed power mobility versus TIS, use of standing feature on power chair versus standing frame for tone management and improved ADL and IADL function. Will trial alternate power steering options at a later date. Educated on use of hoyer lift for transfers and initiating family training with lift tomorrow. Patient and family with questions regarding ambulation, PT reported patient's limited progress with ambulation due to LE and UE tone and barriers to gait training at this time. Informed them that his POC will continue to progress with standing and functional mobility for strengthening and reduced caregiver burden and will include gait training if patient demonstrates appropriate progress with mobility, however, unlikely to occur during patient's stay due to d/c next week. Patient and family receptive and tearful throughout discussion. Stated appreciation for education and management of expectations. Provided encouragement for patient and family to focus on short term goals and  victories in progress.   Also discussed need for 24/7 care, ramp entry to home, hospital bed, and room for all equipment. Provided home measurement sheet to family and they  report that a ramp is being installed and 24/7 care is available at d/c.   Patient in bed with his parents at bedside at end of session with breaks locked and all needs within reach.    Therapy Documentation Precautions:  Precautions Precautions: Fall, Other (comment) Precaution Comments: PEG, significant hypertonia, right ankle PF contracture Required Braces or Orthoses: Other Brace Other Brace: abdominal binder (to keep PEG safe) Restrictions Weight Bearing Restrictions: No    Therapy/Group: Individual Therapy  Kennadie Brenner L Mekhi Lascola PT, DPT  08/07/2019, 4:55 PM

## 2019-08-07 NOTE — Progress Notes (Signed)
Occupational Therapy Note  Patient Details  Name: Chase Caldwell MRN: [id number] Date of Birth: 1980/04/22  Pt seen in conjunction with PT for application of third serial cast.  Pt moved into prone position with max A +2.   Pt with c/o pain at feeding tube site/abdomen, and therefore was moved back to supine with max  A +2 while RN administered medications.   He then moved to prone again with max A +2 with assist at hips, trunk and for positioning UEs.  Initial measurements of Rt ankle are `18*.  Passive stretch performed by PT, and cast applied.  Pt continued with c/o abdominal pain due to feeding tube, with escalation of discomfort by end of casting.  Pt was moved to supine and unfortunately Rt LE spasmed creating buckling at the ankle of the cast and potential pressure point.  Will monitor the splint for signs of pressure.  Currently pt reporting cast/Rt LE feels comfortable.     [name]., OTR/L Acute Rehabilitation Services Pager [phone] Office [phone]    [name] 08/07/2019, 1:06 PM

## 2019-08-07 NOTE — Progress Notes (Signed)
[facility] PHYSICAL MEDICINE & REHABILITATION PROGRESS NOTE  Subjective/Complaints: [name] has no complaints this morning Tone continues to be improved. NPO at midnight tonight with plan to pull PEG tomorrow morning  ROS: Denies CP, SOB, N/V/D  Objective: Vital Signs: Blood pressure (!) 132/87, pulse 87, temperature 97.7 F (36.5 C), resp. rate 19, height 6' (1.829 m), weight (!) 118.6 kg, SpO2 96 %. No results found. Recent Labs    08/05/19 0630  WBC 8.9  HGB 14.4  HCT 44.7  PLT 371   Recent Labs    08/05/19 0630  NA 138  K 4.0  CL 102  CO2 25  GLUCOSE 104*  BUN 13  CREATININE 0.79  CALCIUM 9.7    Physical Exam: BP (!) 132/87 (BP Location: Right Arm)   Pulse 87   Temp 97.7 F (36.5 C)   Resp 19   Ht 6' (1.829 m)   Wt (!) 118.6 kg   SpO2 96%   BMI 35.46 kg/m   General: Alert and oriented x 3, No apparent distress HEENT: Head is normocephalic, atraumatic, PERRLA, EOMI, sclera anicteric, oral mucosa pink and moist, dentition intact, ext ear canals clear,  Neck: Supple without JVD or lymphadenopathy Heart: Reg rate and rhythm. No murmurs rubs or gallops Chest: CTA bilaterally without wheezes, rales, or rhonchi; no distress Abdomen: Soft, non-tender, non-distended, bowel sounds positive. Extremities: No clubbing, cyanosis, or edema. Pulses are 2+ Skin: Clean and intact without signs of breakdown No edema in extremities.   Neuro: Alert Facial paralysis, stable Vision remains limited Significant generalized extensor tone  Motor: Limited due to tone, however moving all extremities   Assessment/Plan: 1. 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  Physiatrist is providing close team supervision and 24 hour management of active medical problems listed below.  Physiatrist and rehab team continue to assess barriers to  discharge/monitor patient progress toward functional and medical goals  Care Tool:  Bathing        Body parts bathed by helper: Right arm, Left arm, Chest, Abdomen, Front perineal area, Buttocks, Right upper leg, Left upper leg, Right lower leg, Left lower leg, Face     Bathing assist Assist Level: 2 Helpers     Upper Body Dressing/Undressing Upper body dressing   What is the patient wearing?: Pull over shirt    Upper body assist Assist Level: Total Assistance - Patient < 25%    Lower Body Dressing/Undressing Lower body dressing      What is the patient wearing?: Pants     Lower body assist Assist for lower body dressing: 2 Helpers     Toileting Toileting Toileting Activity did not occur Landscape architect and hygiene only): Refused  Toileting assist Assist for toileting: 2 Helpers     Transfers Chair/bed transfer  Transfers assist  Chair/bed transfer activity did not occur: Safety/medical concerns (safety/medical b/c we introduced new technique??)  Chair/bed transfer assist level: Moderate Assistance - Patient 50 - 74% Chair/bed transfer assistive device: Sliding board   Locomotion Ambulation   Ambulation assist   Ambulation activity did not occur: Safety/medical concerns          Walk 10 feet activity   Assist  Walk 10 feet activity did not occur: Safety/medical concerns        Walk 50 feet activity   Assist Walk 50 feet with 2 turns activity did not occur: Safety/medical concerns  Walk 150 feet activity   Assist Walk 150 feet activity did not occur: Safety/medical concerns         Walk 10 feet on uneven surface  activity   Assist Walk 10 feet on uneven surfaces activity did not occur: Safety/medical concerns         Wheelchair     Assist Will patient use wheelchair at discharge?:  (likely will require dependent w/c transport)      Wheelchair assist level: Dependent - Patient 0%      Wheelchair 50  feet with 2 turns activity    Assist        Assist Level: Dependent - Patient 0%   Wheelchair 150 feet activity     Assist     Assist Level: Dependent - Patient 0%      Medical Problem List and Plan: 1. Diffuse weakness with hypertonicity and deficits with self-care, swallowing, cognition secondary to TBI with significant Psych history.  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   Per neurology, repeat imaging ~8/2, labs ordered-folate within normal limits, vitamin B12-low normal, magnesium remains pending   B12 supplementation initiated  Chest x-ray personally reviewed, unremarkable for acute infectious process. 2.  Antithrombotics: -DVT/anticoagulation:  Pharmaceutical: Lovenox             -antiplatelet therapy: N/a 3. 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   Hydrocodone in place of tramadol for severe pain  Rx tone as below  7/28 well controlled 4. Mood/agitation/hx of bipolar disorder: Neuropsych and psychiatry to follow during his stay. LCSW to follow for evaluation and support             -antipsychotic agents:  seroquel  -continue cymbalta, zoloft  -added gabapentin as above  -ativan BID discontinued.  -psych also following this patient although hasn't seen for some time  See # 14 5. Neuropsych: This patient is not fully capable of making decisions on his own behalf. 6. Skin/Wound Care: Routine pressure relief measures.  7. Fluids/Electrolytes/Nutrition:  11. Facial Fx/Maxillary fracture s/p repair with associated dysphagia: Wires removed by ENT             Dysphagia 1 thin diet--advance per SLP. NPO at midnight and pull PEG 7/29 AM   12. ABLA:   Hemoglobin 12.8 on 7/19, 7/26 labs stable  Continue to monitor 13. Prediabetes:  Hyperglycemia due to tube feeds. Continue feeds at nights as intake remains variable.    Dc cbg checks/SSI  14.  Severe muscle spasms with extensor tone:     Baclofen increased to 10 3 times daily on 7/11 without much effect, increased to 20 3 times daily on 7/26- tone is improved on 7/27  7/16 Dantrium trial without benefit, DC'd   7/21--  solid cast on patient per OT  -continue ROM with therapies, serial casting   Patient initially stated that [place] had incomplete benefit and was interested in changing to Valium, change made with patient noting further improvement, however significant increase in tone over the last couple days.  Suspect this is not related to medication and related to simultaneous UTI.  Per family request, changed back to Klonopin.  7/28: well controlled 15.  Tachycardia:  Continue  propanolol which was increased to 40 3 times daily on 7/11  See #14, 18 16.  Transaminitis  ALT remains elevated on 7/23, continue to monitor 17.  Hypokalemia  Potassium 4.0 on 7/26  Stop supplementation 18.  Hypertension  Lisinopril 10 started on 7/21  Propanolol 40 3 times daily, increased to 60 3 times daily on 7/24  Elevated diastolic pressures on 7/86, consider further medication adjustment if persistent after treatment of infectious process 19.  Acute lower UTI with Klebsiella and Pseudomonas  Treated with fosfomycin, suspect incomplete treatment  IV Zosyn started on 7/26 20.  Episode of shortness of breath  Repeat chest x-ray ordered- stable  LOS: 19 days A FACE TO FACE EVALUATION WAS PERFORMED  Carson Bogden P Chastidy Ranker 08/07/2019, 3:04 PM

## 2019-08-07 NOTE — Progress Notes (Signed)
Speech Language Pathology Daily Session Note  Patient Details  Name: Chase Caldwell MRN: [id number] Date of Birth: November 09, 1980  Today's Date: 08/07/2019 SLP Individual Time: 1328-1401 SLP Individual Time Calculation (min): 33 min  Short Term Goals: Week 3: SLP Short Term Goal 1 (Week 3): Pt will consume dys 1 textures and thin liquids with minimal s/s aspriation and supervision verbal cues for use of swallow strategies. SLP Short Term Goal 2 (Week 3): Patient will recall new, daily information with Mod I. SLP Short Term Goal 3 (Week 3): Patient will utilize speech intelligibility strategies at the sentence level with Min A verbal cues to achieve ~80% intelligibility.  Skilled Therapeutic Interventions: Pt was seen for skilled ST targeting dysphagia and speech goals. Pt's parents were also present and appropriately engaged at bedside throughout session. Pt was very lethargic upon arrival, but with Moderate verbal and tactile stimulation he aroused appropriately and was agreeable to attempt eating lunch. He consumed his dysphagia 1 (puree) lunch tray with thin liquids without overt s/sx aspiration and used his swallow precautions with Min A verbal and tactile cueing. Recommend continue current diet. Pt also required Min A verbal cues for repeating phrases with use of overarticulation for functional communication during session, but was ~85-90% intelligible. Pt left sitting in bed with alarm set and needs within reach, finishing his lunch with his mother assisting with self-feeding (as she is an approved/trained caregiver to assist with meals). Continue per current plan of care.      Pain Pain Assessment Pain Scale: Faces Faces Pain Scale: No hurt  Therapy/Group: Individual Therapy  Arbutus Leas 08/07/2019, 3:02 PM

## 2019-08-08 ENCOUNTER — Inpatient Hospital Stay (HOSPITAL_COMMUNITY): Payer: Medicaid Other

## 2019-08-08 LAB — VITAMIN B1: Vitamin B1 (Thiamine): 172.1 nmol/L (ref 66.5–200.0)

## 2019-08-08 NOTE — Progress Notes (Signed)
[facility] PHYSICAL MEDICINE & REHABILITATION PROGRESS NOTE  Subjective/Complaints: PEG removed. No bleeding from site Meds may be administered. Toes curled under RLE brace- asked that ortho tech remove brace.  Denies pain except for right wrist   ROS: Denies CP, SOB, N/V/D  Objective: Vital Signs: Blood pressure (!) 129/87, pulse 86, temperature 97.7 F (36.5 C), temperature source Oral, resp. rate 18, height 6' (1.829 m), weight (!) 118.6 kg, SpO2 96 %. No results found. No results for input(s): WBC, HGB, HCT, PLT in the last 72 hours. No results for input(s): NA, K, CL, CO2, GLUCOSE, BUN, CREATININE, CALCIUM in the last 72 hours.  Physical Exam: BP (!) 129/87 (BP Location: Right Arm)   Pulse 86   Temp 97.7 F (36.5 C) (Oral)   Resp 18   Ht 6' (1.829 m)   Wt (!) 118.6 kg   SpO2 96%   BMI 35.46 kg/m   General: Alert and oriented x 3, No apparent distress HEENT: Head is normocephalic, atraumatic, PERRLA, EOMI, sclera anicteric, oral mucosa pink and moist, dentition intact, ext ear canals clear,  Neck: Supple without JVD or lymphadenopathy Heart: Reg rate and rhythm. No murmurs rubs or gallops Chest: CTA bilaterally without wheezes, rales, or rhonchi; no distress Abdomen: Soft, non-tender, non-distended, bowel sounds positive. Extremities: No clubbing, cyanosis, or edema. Pulses are 2+ Skin: PEG removed. PEG site C/D/I No edema in extremities.   Neuro: Alert Facial paralysis, stable Vision remains limited Significant generalized extensor tone  Motor: Limited due to tone, however moving all extremities    Assessment/Plan: 1. 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  Physiatrist is providing close team supervision and 24 hour management of active medical problems listed below.  Physiatrist and rehab team continue to assess barriers  to discharge/monitor patient progress toward functional and medical goals  Care Tool:  Bathing        Body parts bathed by helper: Right arm, Left arm, Chest, Abdomen, Front perineal area, Buttocks, Right upper leg, Left upper leg, Right lower leg, Left lower leg, Face     Bathing assist Assist Level: 2 Helpers     Upper Body Dressing/Undressing Upper body dressing   What is the patient wearing?: Pull over shirt    Upper body assist Assist Level: Total Assistance - Patient < 25%    Lower Body Dressing/Undressing Lower body dressing      What is the patient wearing?: Pants     Lower body assist Assist for lower body dressing: 2 Helpers     Toileting Toileting Toileting Activity did not occur Landscape architect and hygiene only): Refused  Toileting assist Assist for toileting: 2 Helpers     Transfers Chair/bed transfer  Transfers assist  Chair/bed transfer activity did not occur: Safety/medical concerns (safety/medical b/c we introduced new technique??)  Chair/bed transfer assist level: Moderate Assistance - Patient 50 - 74% Chair/bed transfer assistive device: Sliding board   Locomotion Ambulation   Ambulation assist   Ambulation activity did not occur: Safety/medical concerns          Walk 10 feet activity   Assist  Walk 10 feet activity did not occur: Safety/medical concerns        Walk 50 feet activity   Assist Walk 50 feet with 2 turns activity did not occur: Safety/medical concerns         Walk 150 feet activity   Assist  Walk 150 feet activity did not occur: Safety/medical concerns         Walk 10 feet on uneven surface  activity   Assist Walk 10 feet on uneven surfaces activity did not occur: Safety/medical concerns         Wheelchair     Assist Will patient use wheelchair at discharge?:  (likely will require dependent w/c transport)      Wheelchair assist level: Dependent - Patient 0%      Wheelchair 50  feet with 2 turns activity    Assist        Assist Level: Dependent - Patient 0%   Wheelchair 150 feet activity     Assist     Assist Level: Dependent - Patient 0%      Medical Problem List and Plan: 1. Diffuse weakness with hypertonicity and deficits with self-care, swallowing, cognition secondary to TBI with significant Psych history.  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   Per neurology, repeat imaging ~8/2, labs ordered-folate within normal limits, vitamin B12-low normal, magnesium remains pending   B12 supplementation initiated  Chest x-ray personally reviewed, unremarkable for acute infectious process. 2.  Antithrombotics: -DVT/anticoagulation:  Pharmaceutical: Lovenox             -antiplatelet therapy: N/a 3. 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   Hydrocodone in place of tramadol for severe pain  Rx tone as below  7/29: well controlled except for left wrist- casting today 4. Mood/agitation/hx of bipolar disorder: Neuropsych and psychiatry to follow during his stay. LCSW to follow for evaluation and support             -antipsychotic agents:  seroquel  -continue cymbalta, zoloft  -added gabapentin as above  -ativan BID discontinued.  -psych also following this patient although hasn't seen for some time  See # 14 5. Neuropsych: This patient is not fully capable of making decisions on his own behalf. 6. Skin/Wound Care: Routine pressure relief measures.  7. Fluids/Electrolytes/Nutrition:  11. Facial Fx/Maxillary fracture s/p repair with associated dysphagia: Wires removed by ENT             Dysphagia 1 thin diet--advance per SLP. PEG removed 6/27 without complications  12. ABLA:   Hemoglobin 12.8 on 7/19, 7/26 labs  stable  Continue to monitor 13. Prediabetes: Hyperglycemia due to tube feeds. Continue feeds at nights as intake remains variable.    Dc cbg checks/SSI  14.  Severe muscle spasms with extensor tone:     Baclofen increased to 10 3 times daily on 7/11 without much effect, increased to 20 3 times daily on 7/26- tone is improved on 7/27  7/16 Dantrium trial without benefit, DC'd   7/21--  solid cast on patient per OT  -continue ROM with therapies, serial casting   Patient initially stated that [place] had incomplete benefit and was interested in changing to Valium, change made with patient noting further improvement, however significant increase in tone over the last couple days.  Suspect this is not related to medication and related to simultaneous UTI.  Per family request, changed back to Klonopin.  7/29: worse this morning, requested nurse to given morning meds.  15.  Tachycardia:  Continue propanolol which was increased to 40 3 times daily on 7/11  See #14, 18 16.  Transaminitis  ALT remains elevated on 7/23, continue to monitor 17.  Hypokalemia  Potassium 4.0 on 7/26  Stop supplementation 18.  Hypertension  Lisinopril 10 started on 7/21  Propanolol 40 3 times daily, increased to 60 3 times daily on 7/24  Elevated diastolic pressures on 3/89, consider further medication adjustment if persistent after treatment of infectious process 19.  Acute lower UTI with Klebsiella and Pseudomonas  Treated with fosfomycin, suspect incomplete treatment  IV Zosyn started on 7/26 20.  Episode of shortness of breath  Repeat chest x-ray ordered- stable  LOS: 20 days A FACE TO FACE EVALUATION WAS PERFORMED  Martha Clan P Kloi Brodman 08/08/2019, 8:27 AM

## 2019-08-08 NOTE — Progress Notes (Signed)
Orthopedic Tech Progress Note Patient Details:  Chase Caldwell 03/28/1980 [id number]  Casting Type of Cast: Short leg cast Cast Location: Right Lower Extremity Cast Material: Fiberglass Cast Intervention: Removal  Post Interventions Patient Tolerated: Well Instructions Provided: Care of device, Adjustment of device     Joel Cowin P Lorel Monaco 08/08/2019, 9:36 AM

## 2019-08-08 NOTE — Progress Notes (Signed)
Attempted to get patient weight, but bed inaccurate, giving weight of 0.8kg. Will pass along to oncoming nurse.

## 2019-08-08 NOTE — Progress Notes (Signed)
Occupational Therapy Session Note  Patient Details  Name: Chase Caldwell MRN: [id number] Date of Birth: 05-13-1980  Today's Date: 08/08/2019 OT Individual Time: 9292-4462 OT Individual Time Calculation (min): 60 min    Short Term Goals: Week 1:  OT Short Term Goal 1 (Week 1): Pt will sitEOB with mod A in prep for functional ADL task OT Short Term Goal 1 - Progress (Week 1): Met OT Short Term Goal 2 (Week 1): Pt will self feed with AE with max A OT Short Term Goal 2 - Progress (Week 1): Met OT Short Term Goal 3 (Week 1): Pt will tolerate sitting upright out of bed for ~3 hrs OT Short Term Goal 3 - Progress (Week 1): Progressing toward goal OT Short Term Goal 4 (Week 1): Pt will roll to right and left with max A of one caregiver OT Short Term Goal 4 - Progress (Week 1): Progressing toward goal  Skilled Therapeutic Interventions/Progress Updates:    1:1. Pt received in bed agreeable to OT. Pt with intermittent pain in L wrist and shoulder, however heat applied and aided pain. RN delivering medicaitons during session. Pt completes bridging hips while supine to advance pants past hips. Pt dons shirt total A rolling with MOD A overall while +2 pulls shirt down back. While providing tone inhibiting techniques/positions OT provides PROM to L wrist and +2 positioning RLE for orthotech to cut off circomfrential cast to reduce spasms and increase safe positioning for cast removal. Exited session with pt seated in bed, positioned to comfort and call lignt in reach by head (soft touch)  Therapy Documentation Precautions:  Precautions Precautions: Fall, Other (comment) Precaution Comments: PEG, significant hypertonia, right ankle PF contracture Required Braces or Orthoses: Other Brace Other Brace: abdominal binder (to keep PEG safe) Restrictions Weight Bearing Restrictions: No General:   Vital Signs: Therapy Vitals Temp: 97.7 F (36.5 C) Temp Source: Oral Pulse Rate: 86 Resp: 18 BP: (!)  129/87 Patient Position (if appropriate): Lying Pain:   ADL: ADL Eating: Dependent Where Assessed-Eating: Bed level Grooming: Dependent Where Assessed-Grooming: Wheelchair Upper Body Bathing: Dependent Where Assessed-Upper Body Bathing: Wheelchair Lower Body Bathing: Dependent Where Assessed-Lower Body Bathing: Edge of bed Upper Body Dressing: Dependent Where Assessed-Upper Body Dressing: Wheelchair Lower Body Dressing: Dependent Toileting: Dependent Toilet Transfer: Not assessed Social research officer, government: Not assessed Vision   Perception    Praxis   Exercises:   Other Treatments:     Therapy/Group: Individual Therapy  Tonny Branch 08/08/2019, 8:21 AM
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
## 2019-08-08 NOTE — Progress Notes (Signed)
Physical Therapy Session Note  Patient Details  Name: Chase Caldwell MRN: [id number] Date of Birth: 1980-08-12  Today's Date: 08/08/2019 PT Individual Time: 1000-1100 and  1345-1450  PT Individual Time Calculation (min): 60 min and 65 min  Short Term Goals: Week 3:  PT Short Term Goal 1 (Week 3): Patient will initiate power w/c mobility. PT Short Term Goal 2 (Week 3): Patient will perform bed mobility with max A +1 consistently. PT Short Term Goal 3 (Week 3): Patient will perform basic transfer with max A +1.  Skilled Therapeutic Interventions/Progress Updates:     Session 1: Patient in bed with his parents in the room upon PT arrival. Patient alert and agreeable to PT session. Patient reported 5-7/10 intermittent L wrist pain during session, RN made aware. PT provided repositioning, rest breaks, and distraction as pain interventions throughout session. Noted increased tone today compared to yesterday, mostly L UE, but also hip and knee flexor tone with PF in B LE in supine. Patient reported that he did not sleep well last night and that he had not eaten due to NPO orders for PEG foley removal.   Focused session on family training with hoyer lift. Demonstrated functional and use of hoyer lift and sling and his parents performed hands on training for lift transfers bed<>[name]. Patient performed rolling R/L with min A +2 for sling placement. Provided cues for reaching with opposite UE and pushing with opposite LE in hook-lying to assist with rolling. Required +3 assist and a fourth person to manage the bed to transfer patient for safety due to elevated bed height of [name] and for PT to demonstrate and cue patient's parents. Provided cues for use of lift, turning patient in the sling, and assisting positioning in the chair before as patient was lowered into the chair/bed. Also required increased assist time due to L UE extensor tone requiring constant management throughout transfer.   Patient in  bed with his parents at bedside at end of session with breaks locked, bed alarm set, and all needs within reach.   Session 2: Patient in bed asleep with his parents at bedside upon PT arrival. Patient easily aroused and reported increased fatigue this afternoon. Also reported that he had not eaten anything today, but a tray was coming for lunch. PT retrieved his tray from the nurses station. Patient was agreeable to PT session focused on sitting EOB to eat lunch and functional use of UE for feeding.   Therapeutic Activity: Bed Mobility: Patient performed supine to/from sit with max of 1 person to sit and mod-max of 2 to lie down. Provided verbal cues for sequencing and timing.  Transfers: Patient performed a slide board transfer bed<>w/c with mod-max of 1 with a second person SBA downhill and max of 2 uphill and total A for board placement. Provided cues for hand placement, board placement, and head-hips relationship for proper technique and decreased assist with transfers.   Neuromuscular Re-ed: Patient performed the following sitting balance and UE functional feeding activities: -patient sat EOB with CGA-max A, fluctuated based on attention and fatigue, x15 min and ate 50 % of chicken broth with dependent feeding assist. Provided cues for chin tuck with swallowing and not pocketing food between bites.  -sitting in [name], patient was able to use gross grasp with red tubing over spoon to eat pudding with max progressing to mod A for facilitation/tracking with R UE, provided cues for hand positioning, R elbow elevation with shoulder abduction, and directions  for reaching target (pudding or mouth)  Offered to attempt standing with patient this session, patient declined due to fatigue and asking to return to the bed. Patient fell asleep once in supine at end of session. Patient missed 10 min of skilled PT due to fatigue, RN made aware. Will attempt to make-up missed time as able.    Patient in bed  with his parents at bedside at end of session with breaks locked and all needs within reach.   Therapy Documentation Precautions:  Precautions Precautions: Fall, Other (comment) Precaution Comments: PEG, significant hypertonia, right ankle PF contracture Required Braces or Orthoses: Other Brace Other Brace: abdominal binder (to keep PEG safe) Restrictions Weight Bearing Restrictions: No General: PT Amount of Missed Time (min): 10 Minutes PT Missed Treatment Reason: Patient fatigue    Therapy/Group: Individual Therapy  Malon Branton L Capitola Ladson PT, DPT  08/08/2019, 4:43 PM

## 2019-08-09 ENCOUNTER — Inpatient Hospital Stay (HOSPITAL_COMMUNITY): Payer: Medicaid Other | Admitting: Physical Therapy

## 2019-08-09 ENCOUNTER — Inpatient Hospital Stay (HOSPITAL_COMMUNITY): Payer: Medicaid Other

## 2019-08-09 ENCOUNTER — Inpatient Hospital Stay (HOSPITAL_COMMUNITY): Payer: Medicaid Other | Admitting: Speech Pathology

## 2019-08-09 NOTE — Progress Notes (Signed)
Patient ID: Chase Caldwell, male   DOB: 02-05-1980, 39 y.o.   MRN: [id number]   Sw spoke with his wife Roland Earl to just review discharge. She reported she is available for family edu when needed, but allowing his parents to get the training first. She confirms she has heard from [place]. SW informed will follow-up about final d/c recommendations.   Loralee Pacas, MSW, [place] Office: [phone] Cell: [phone] Fax: [phone]

## 2019-08-09 NOTE — Progress Notes (Signed)
[place] PHYSICAL MEDICINE & REHABILITATION PROGRESS NOTE  Subjective/Complaints: PEG site healing well.  Spasticity improved today Parents at bedside Denies pain except in buttock  ROS: Denies CP, SOB, N/V/D  Objective: Vital Signs: Blood pressure (!) 121/94, pulse 77, temperature 97.7 F (36.5 C), temperature source Oral, resp. rate 18, height 6' (1.829 m), weight (!) 118.6 kg, SpO2 99 %. No results found. No results for input(s): WBC, HGB, HCT, PLT in the last 72 hours. No results for input(s): NA, K, CL, CO2, GLUCOSE, BUN, CREATININE, CALCIUM in the last 72 hours.  Physical Exam: BP (!) 121/94 (BP Location: Right Arm)   Pulse 77   Temp 97.7 F (36.5 C) (Oral)   Resp 18   Ht 6' (1.829 m)   Wt (!) 118.6 kg   SpO2 99%   BMI 35.46 kg/m  General: No apparent distress HEENT: Head is normocephalic, atraumatic, PERRLA, EOMI, sclera anicteric, oral mucosa pink and moist, dentition intact, ext ear canals clear,  Neck: Supple without JVD or lymphadenopathy Heart: Reg rate and rhythm. No murmurs rubs or gallops Chest: CTA bilaterally without wheezes, rales, or rhonchi; no distress Abdomen: Soft, non-tender, non-distended, bowel sounds positive. Extremities: No clubbing, cyanosis, or edema. Pulses are 2+ Skin: PEG removed. PEG site C/D/I No edema in extremities.   Neuro: Alert Facial paralysis, stable Vision remains limited Significant generalized extensor tone  Motor: Limited due to tone, however moving all extremities  Assessment/Plan: 1. 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  Physiatrist is providing close team supervision and 24 hour management of active medical problems listed below.  Physiatrist and rehab team continue to assess barriers to discharge/monitor patient progress toward functional and medical goals  Care  Tool:  Bathing        Body parts bathed by helper: Right arm, Left arm, Chest, Abdomen, Front perineal area, Buttocks, Right upper leg, Left upper leg, Right lower leg, Left lower leg, Face     Bathing assist Assist Level: 2 Helpers     Upper Body Dressing/Undressing Upper body dressing   What is the patient wearing?: Pull over shirt    Upper body assist Assist Level: Total Assistance - Patient < 25%    Lower Body Dressing/Undressing Lower body dressing      What is the patient wearing?: Pants     Lower body assist Assist for lower body dressing: 2 Helpers     Toileting Toileting Toileting Activity did not occur Landscape architect and hygiene only): Refused  Toileting assist Assist for toileting: 2 Helpers     Transfers Chair/bed transfer  Transfers assist  Chair/bed transfer activity did not occur: Safety/medical concerns (safety/medical b/c we introduced new technique??)  Chair/bed transfer assist level: 2 Helpers Chair/bed transfer assistive device: Sliding board   Locomotion Ambulation   Ambulation assist   Ambulation activity did not occur: Safety/medical concerns          Walk 10 feet activity   Assist  Walk 10 feet activity did not occur: Safety/medical concerns        Walk 50 feet activity   Assist Walk 50 feet with 2 turns activity did not occur: Safety/medical concerns         Walk 150 feet activity   Assist Walk 150 feet activity did not occur: Safety/medical concerns         Walk 10 feet on uneven surface  activity  Assist Walk 10 feet on uneven surfaces activity did not occur: Safety/medical concerns         Wheelchair     Assist Will patient use wheelchair at discharge?:  (likely will require dependent w/c transport)      Wheelchair assist level: Dependent - Patient 0%      Wheelchair 50 feet with 2 turns activity    Assist        Assist Level: Dependent - Patient 0%   Wheelchair 150  feet activity     Assist     Assist Level: Dependent - Patient 0%      Medical Problem List and Plan: 1. Diffuse weakness with hypertonicity and deficits with self-care, swallowing, cognition secondary to TBI with significant Psych history.  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   Per neurology, repeat imaging ~8/2, labs ordered-folate within normal limits, vitamin B12-low normal, magnesium remains pending   B12 supplementation initiated  Chest x-ray personally reviewed, unremarkable for acute infectious process. 2.  Antithrombotics: -DVT/anticoagulation:  Pharmaceutical: Lovenox             -antiplatelet therapy: N/a 3. 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   Hydrocodone in place of tramadol for severe pain  Rx tone as below  7/30: tone better controlled 4. Mood/agitation/hx of bipolar disorder: Neuropsych and psychiatry to follow during his stay. LCSW to follow for evaluation and support             -antipsychotic agents:  seroquel  -continue cymbalta, zoloft  -added gabapentin as above  -ativan BID discontinued.  -psych also following this patient although hasn't seen for some time  See # 14 5. Neuropsych: This patient is not fully capable of making decisions on his own behalf. 6. Skin/Wound Care: Routine pressure relief measures.  7. Fluids/Electrolytes/Nutrition:  11. Facial Fx/Maxillary fracture s/p repair with associated dysphagia: Wires removed by ENT             Dysphagia 1 thin diet--advance per SLP. PEG removed 3/87 without complications  12. ABLA:   Hemoglobin 12.8 on 7/19, 7/26 labs stable  Continue to monitor 13. Prediabetes: Hyperglycemia due to tube feeds. Continue feeds at nights as intake remains variable.    Dc cbg checks/SSI   14.  Severe muscle spasms with extensor tone:     Baclofen increased to 10 3 times daily on 7/11 without much effect, increased to 20 3 times daily on 7/26- tone is improved on 7/27  7/16 Dantrium trial without benefit, DC'd   7/21--  solid cast on patient per OT  -continue ROM with therapies, serial casting   Patient initially stated that [place] had incomplete benefit and was interested in changing to Valium, change made with patient noting further improvement, however significant increase in tone over the last couple days.  Suspect this is not related to medication and related to simultaneous UTI.  Per family request, changed back to Klonopin.  7/30: improved with mother's massage, vibration device 15.  Tachycardia:  Continue propanolol which was increased to 40 3 times daily on 7/11  See #14, 18  7/30: well controlled 16.  Transaminitis  ALT remains elevated on 7/23, continue to monitor 17.  Hypokalemia  Potassium 4.0 on 7/26  Stop supplementation 18.  Hypertension  Lisinopril 10 started on 7/21  Propanolol 40 3 times daily, increased to 60 3 times daily on 7/24  Elevated diastolic pressures on 2/42, consider further medication adjustment if persistent after treatment of infectious process 19.  Acute lower UTI with Klebsiella and Pseudomonas  Treated with fosfomycin, suspect incomplete treatment  IV Zosyn started on 7/26 20.  Episode of shortness of breath  Repeat chest x-ray ordered- stable  LOS: 21 days A FACE TO FACE EVALUATION WAS PERFORMED  Arch Methot P Samyak Sackmann 08/09/2019, 10:05 AM

## 2019-08-09 NOTE — Progress Notes (Signed)
Physical Therapy Session Note  Patient Details  Name: Chase Caldwell MRN: [id number] Date of Birth: [birth year]  Today's Date: 08/09/2019 PT Individual Time: 0805-0900 PT Individual Time Calculation (min): 55 min   Short Term Goals: Week 3:  PT Short Term Goal 1 (Week 3): Patient will initiate power w/c mobility. PT Short Term Goal 2 (Week 3): Patient will perform bed mobility with max A +1 consistently. PT Short Term Goal 3 (Week 3): Patient will perform basic transfer with max A +1.  Skilled Therapeutic Interventions/Progress Updates:   Pt received supine in bed and agreeable to PT. Pt's parents and wife present for family education. [place] lift training performed to transfer to [facility]. Rolling R and L with max assist to place sling x2. On first attempt, sling placed to high, and family able to recognized unsafe set up and return to bed with min cues from PT to improve sling position. once in [place]. Pt reports feeling too far forward in seat. PT educated family on using tilt feature to reposition. Eating task with built up handle x 6 bites. Pt reports that eating is increasing spasms in the LUE, and completed by mother while PT attempted to break tone in the LUE using vibration. Pt left in bed with call bell left and all needs met.      Therapy Documentation Precautions:  Precautions Precautions: Fall, Other (comment) Precaution Comments: PEG, significant hypertonia, right ankle PF contracture Required Braces or Orthoses: Other Brace Other Brace: abdominal binder (to keep PEG safe) Restrictions Weight Bearing Restrictions: No Pain:   denies at rest. But noted to have severe spasms in BLE and LUE with any exertion.     Therapy/Group: Individual Therapy  Lorie Phenix 08/09/2019, 10:33 AM

## 2019-08-09 NOTE — Progress Notes (Signed)
Speech Language Pathology Daily Session Note  Patient Details  Name: Chase Caldwell MRN: [id number] Date of Birth: May 10, 1980  Today's Date: 08/09/2019 SLP Individual Time: 1305-1330 SLP Individual Time Calculation (min): 25 min  Short Term Goals: Week 3: SLP Short Term Goal 1 (Week 3): Pt will consume dys 1 textures and thin liquids with minimal s/s aspriation and supervision verbal cues for use of swallow strategies. SLP Short Term Goal 2 (Week 3): Patient will recall new, daily information with Mod I. SLP Short Term Goal 2 - Progress (Week 3): Progressing toward goal SLP Short Term Goal 3 (Week 3): Patient will utilize speech intelligibility strategies at the sentence level with Min A verbal cues to achieve ~80% intelligibility. SLP Short Term Goal 3 - Progress (Week 3): Met  Skilled Therapeutic Interventions:   Pt was seen for skilled ST targeting dysphagia goals.  Pt consumed presentations of his currently prescribed diet with total assist for feeding and supervision cues for use of swallowing precautions.  No overt s/s of aspiration were evident with solids but he did have immediate, strong reflexive coughing x1 when drinking liquid medications.  Pt is afebrile and O2 sats are [facility] on room air.  Recommend that pt remain on his currently prescribed diet.  Pt was left in bed with family at bedside, mother providing assist for feeding at time of therapist's departure with RN at bedside.  Weekly progress note completed also on this date with goals updated to reflect current progress and plan of care.      Pain Pain Assessment Pain Scale: 0-10 Pain Score: 0-No pain  Therapy/Group: Individual Therapy  Lanina Larranaga, Selinda Orion 08/09/2019, 4:34 PM

## 2019-08-09 NOTE — Progress Notes (Signed)
Occupational Therapy Note  Patient Details  Name: Chase Caldwell MRN: [id number] Date of Birth: 27-Sep-1980  Pt seen for fabrication of modified resting hand splint with wrist and fingers positioned in composite extension.   Passive stretch of wrist and fingers performed.  Splint was fabricated and appears to be fitting well.  Pt denies pain/discomfort and sharp/dull sensation is intact. Both parents and pt were instructed in purpose of splint.   Parents were instructed how to don/doff splint and were able to return demonstration.  All questions answered.  Primary OT will complete education with nursing.    Chase Caldwell., OTR/L Acute Rehabilitation Services Pager [phone] Office [phone]    [name] 08/09/2019, 4:27 PM

## 2019-08-09 NOTE — Progress Notes (Signed)
Speech Language Pathology Weekly Progress and Session Note  Patient Details  Name: Chase Caldwell MRN: [id number] Date of Birth: 1980-07-28  Beginning of progress report period:  August 02, 2019 End of progress report period:  August 09, 2019    Short Term Goals: Week 3: SLP Short Term Goal 1 (Week 3): Pt will consume dys 1 textures and thin liquids with minimal s/s aspriation and supervision verbal cues for use of swallow strategies. SLP Short Term Goal 2 (Week 3): Patient will recall new, daily information with Mod I. SLP Short Term Goal 2 - Progress (Week 3): Progressing toward goal SLP Short Term Goal 3 (Week 3): Patient will utilize speech intelligibility strategies at the sentence level with Min A verbal cues to achieve ~80% intelligibility. SLP Short Term Goal 3 - Progress (Week 3): Met    New Short Term Goals: Week 4: SLP Short Term Goal 1 (Week 4): STG=LTG due to remaining LOS  Weekly Progress Updates:   Pt has made functional gains this reporting period and has met 2 out of 3 short term goals.  Pt is currently supervision- min assist for tasks due to challenges in speech intelligibility and recall.  Pt is consuming a dys 1, thin liquids diet with supervision cues for use of swallowing precautions.  Pt and family education is ongoing.  Pt would continue to benefit from skilled ST while inpatient in order to maximize functional independence and reduce burden of care prior to discharge.  Anticipate that pt will need 24/7 supervision at discharge in addition to [place] follow up at next level of care  Intensity: Minumum of 1-2 x/day, 30 to 90 minutes Frequency: 3 to 5 out of 7 days Duration/Length of Stay: 08/17/19 Treatment/Interventions: Cognitive remediation/compensation;Cueing hierarchy;Functional tasks;Speech/Language facilitation;Patient/family education;Oral motor exercises;Dysphagia/aspiration precaution training     Miran Kautzman, Selinda Orion 08/09/2019, 4:35 PM

## 2019-08-09 NOTE — Progress Notes (Signed)
Occupational Therapy Session Note  Patient Details  Name: Chase Caldwell MRN: [id number] Date of Birth: Jul 04, 1980  Today's Date: 08/09/2019 OT Individual Time: 1100-1215 OT Individual Time Calculation (min): 75 min    Short Term Goals: Week 1:  OT Short Term Goal 1 (Week 1): Pt will sitEOB with mod A in prep for functional ADL task OT Short Term Goal 1 - Progress (Week 1): Met OT Short Term Goal 2 (Week 1): Pt will self feed with AE with max A OT Short Term Goal 2 - Progress (Week 1): Met OT Short Term Goal 3 (Week 1): Pt will tolerate sitting upright out of bed for ~3 hrs OT Short Term Goal 3 - Progress (Week 1): Progressing toward goal OT Short Term Goal 4 (Week 1): Pt will roll to right and left with max A of one caregiver OT Short Term Goal 4 - Progress (Week 1): Progressing toward goal  Skilled Therapeutic Interventions/Progress Updates:    1:1. Pt received in bed agreeable to OT. Wendi C. OT also present to attempt casting. Ultimately spasticity and tone impacting safety and ability to make cast despite wrapping with padding and inhibilition techniques. Pt tolerates 60 min of PROM of full extension at wrist and digit with Wendi attempting to wrap in prep for casting and father holding down elbow. Pt too uncomfortable in chair potentially increasing spasticity. Mother and husband place U sling and transfer pt back to bed with min VC for hoyer management. Exited session with pt seated in bed, exit alarmon and call light tin reach   Therapy Documentation Precautions:  Precautions Precautions: Fall, Other (comment) Precaution Comments: PEG, significant hypertonia, right ankle PF contracture Required Braces or Orthoses: Other Brace Other Brace: abdominal binder (to keep PEG safe) Restrictions Weight Bearing Restrictions: No General:   Vital Signs:  Pain:   ADL: ADL Eating: Dependent Where Assessed-Eating: Bed level Grooming: Dependent Where Assessed-Grooming:  Wheelchair Upper Body Bathing: Dependent Where Assessed-Upper Body Bathing: Wheelchair Lower Body Bathing: Dependent Where Assessed-Lower Body Bathing: Edge of bed Upper Body Dressing: Dependent Where Assessed-Upper Body Dressing: Wheelchair Lower Body Dressing: Dependent Toileting: Dependent Toilet Transfer: Not assessed Social research officer, government: Not assessed Vision   Perception    Praxis   Exercises:   Other Treatments:     Therapy/Group: Individual Therapy  Tonny Branch 08/09/2019, 12:19 PM

## 2019-08-10 ENCOUNTER — Inpatient Hospital Stay (HOSPITAL_COMMUNITY): Payer: Medicaid Other | Admitting: Physical Therapy

## 2019-08-10 ENCOUNTER — Inpatient Hospital Stay (HOSPITAL_COMMUNITY): Payer: Medicaid Other | Admitting: Occupational Therapy

## 2019-08-10 MED ORDER — CLONAZEPAM 0.5 MG PO TABS
1.0000 mg | ORAL_TABLET | Freq: Four times a day (QID) | ORAL | Status: DC
  Administered 2019-08-10 – 2019-08-17 (×26): 1 mg via ORAL
  Filled 2019-08-10 (×27): qty 2

## 2019-08-10 NOTE — Plan of Care (Signed)
Goals downgraded or discontinued d/t progress Celese Banner MOT OTR/L  Problem: Sit to Stand Goal: LTG:  Patient will perform sit to stand in prep for activites of daily living with assistance level (OT) Description: LTG:  Patient will perform sit to stand in prep for activites of daily living with assistance level (OT) Outcome: Not Applicable Note: Discontinued d/t progress   Problem: RH Bathing Goal: LTG Patient will bathe all body parts with assist levels (OT) Description: LTG: Patient will bathe all body parts with assist levels (OT) Flowsheets (Taken 08/10/2019 1615) LTG: Pt will perform bathing with assistance level/cueing: Total Assistance - Patient < 25% Note: Downgraded d/t progress   Problem: RH Dressing Goal: LTG Patient will perform upper body dressing (OT) Description: LTG Patient will perform upper body dressing with assist, with/without cues (OT). Flowsheets (Taken 08/10/2019 1615) LTG: Pt will perform upper body dressing with assistance level of: Total Assistance - Patient < 25% Note: Downgraded d/t progress Goal: LTG Patient will perform lower body dressing w/assist (OT) Description: LTG: Patient will perform lower body dressing with assist, with/without cues in positioning using equipment (OT) Outcome: Not Applicable Note: Discontinued d/t progress   Problem: RH Dressing Goal: LTG Patient will perform lower body dressing w/assist (OT) Description: LTG: Patient will perform lower body dressing with assist, with/without cues in positioning using equipment (OT) Outcome: Not Applicable Note: Discontinued d/t progress   Problem: RH Toilet Transfers Goal: LTG Patient will perform toilet transfers w/assist (OT) Description: LTG: Patient will perform toilet transfers with assist, with/without cues using equipment (OT) Outcome: Not Applicable Note: Discontinued d/t progress   Problem: RH Dressing Goal: LTG Patient will perform upper body dressing  (OT) Description: LTG Patient will perform upper body dressing with assist, with/without cues (OT). Flowsheets (Taken 08/10/2019 1615) LTG: Pt will perform upper body dressing with assistance level of: Total Assistance - Patient < 25% Note: Downgraded d/t progress   Problem: RH Pre-functional/Other (Specify) Goal: RH LTG OT (Specify) 2 Description: RH LTG OT (Specify) 2  Outcome: Not Applicable Flowsheets (Taken 08/10/2019 1615) LTG: Other OT (Specify) 2: Discontinued d/t progress

## 2019-08-10 NOTE — Progress Notes (Signed)
Occupational Therapy Weekly Progress Note  Patient Details  Name: Chase Caldwell MRN: [id number] Date of Birth: 1980/03/08  Beginning of progress report period: August 02, 2019 End of progress report period: August 10, 2019  Patient has met 3 of 4 short term goals. Pt has made slow progress this reporting period with slide board transfers, pre functional movements and family training. Pt varies in ability to perform SB transfers with as little as mod-max +2 and up to total A +2 for uphill. Pt family has begun caregiver training and is able to complete hoyer lift transfer with min VC for set up/positioining prior to transfer. Pt goals will likely be downgraded to reflect max-total A and reducing caregiver burden d/t continued systemic spastic hypertonicity in all 4 limbs and visual/perceptual deficits impacting independent performace of BADLs.    Patient continues to demonstrate the following deficits: muscle weakness and muscle joint tightness, decreased cardiorespiratoy endurance, impaired timing and sequencing, abnormal tone, unbalanced muscle activation, decreased coordination and decreased motor planning, decreased visual acuity and decreased visual perceptual skills, decreased motor planning, decreased attention, decreased awareness, decreased problem solving, decreased safety awareness, decreased memory and delayed processing and decreased sitting balance, decreased standing balance, decreased postural control and decreased balance strategies and therefore will continue to benefit from skilled OT intervention to enhance overall performance with BADL and Reduce care partner burden.  Patient not progressing toward long term goals.  See goal revision..  Plan of care revisions: Discontinued toileting/transfer goals, lowered LB/UB dressing to total A; Added prefucntional goals to reduce burden of care (bed mobility/bridging).  OT Short Term Goals Week 3:  OT Short Term Goal 1 (Week 3): Pt will sit upright  OOB for 3 hours to improve OOB tolerance OT Short Term Goal 1 - Progress (Week 3): Met OT Short Term Goal 2 (Week 3): Pt will thread 1UE into shirt wiht MIN A OT Short Term Goal 2 - Progress (Week 3): Progressing toward goal OT Short Term Goal 3 (Week 3): Pt will sit EOB with S overall OT Short Term Goal 3 - Progress (Week 3): Met OT Short Term Goal 4 (Week 3): Pt will complete transfer to w/c with LRAD and MAX A of 1 caregover consistently in prep for OOB toileting OT Short Term Goal 4 - Progress (Week 3): Met Week 4:  OT Short Term Goal 1 (Week 4): STG=LTG d/t ELOS  Skilled Therapeutic Interventions/Progress Updates:      Therapy Documentation Precautions:  Precautions Precautions: Fall, Other (comment) Precaution Comments: PEG, significant hypertonia, right ankle PF contracture Required Braces or Orthoses: Other Brace Other Brace: abdominal binder (to keep PEG safe) Restrictions Weight Bearing Restrictions: No General:   Vital Signs: Therapy Vitals Temp: 99.3 F (37.4 C) Temp Source: Oral Pulse Rate: 80 Resp: 16 BP: 121/84 Patient Position (if appropriate): Lying Oxygen Therapy SpO2: 98 % O2 Device: Room Air Pain: Pain Assessment Pain Score: 0-No pain ADL: ADL Eating: Dependent Where Assessed-Eating: Bed level Grooming: Dependent Where Assessed-Grooming: Wheelchair Upper Body Bathing: Dependent Where Assessed-Upper Body Bathing: Wheelchair Lower Body Bathing: Dependent Where Assessed-Lower Body Bathing: Edge of bed Upper Body Dressing: Dependent Where Assessed-Upper Body Dressing: Wheelchair Lower Body Dressing: Dependent Toileting: Dependent Toilet Transfer: Not assessed Social research officer, government: Not assessed Vision   Perception    Praxis   Exercises:   Other Treatments:    Tonny Branch 08/10/2019, 4:05 PM

## 2019-08-10 NOTE — Progress Notes (Signed)
[place] PHYSICAL MEDICINE & REHABILITATION PROGRESS NOTE  Subjective/Complaints:  Pt asking for klonopin to be given q6 hours due to tremors/spasms being  So bad when it wears off.   Max frequency is q6 hours when talked with pharmacy- will try to increase to q6 hours, but not more frequently.   Doing "great otherwise"- wife at bedside  ROS:  Pt denies SOB, abd pain, CP, N/V/C/D, and vision changes   Objective: Vital Signs: Blood pressure (!) 134/94, pulse 76, temperature 98 F (36.7 C), temperature source Oral, resp. rate 18, height 6' (1.829 m), weight (!) 118.6 kg, SpO2 99 %. No results found. No results for input(s): WBC, HGB, HCT, PLT in the last 72 hours. No results for input(s): NA, K, CL, CO2, GLUCOSE, BUN, CREATININE, CALCIUM in the last 72 hours.  Physical Exam: BP (!) 134/94 (BP Location: Right Arm)   Pulse 76   Temp 98 F (36.7 C) (Oral)   Resp 18   Ht 6' (1.829 m)   Wt (!) 118.6 kg   SpO2 99%   BMI 35.46 kg/m  General: No apparent distress- sitting up, in ball/legs towards chest, wife at bedside, NAD HEENT: not making eye contact- dysconjugate gaze Neck: Supple without JVD or lymphadenopathy Heart:  RRR Chest: CTA B/L- no W/R/R- good air movement Abdomen: Soft, NT, ND, (+)BS  Extremities: No clubbing, cyanosis, or edema. Pulses are 2+ Skin: PEG removed. PEG site C/D/I- no change No edema in extremities.   Neuro: Alert Facial paralysis, stable Vision remains limited Significant generalized extensor tone  Motor: Limited due to tone, however moving all extremities  Assessment/Plan: 1. 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  Physiatrist is providing close team supervision and 24 hour management of active medical problems listed below.  Physiatrist and rehab team continue to assess barriers to discharge/monitor  patient progress toward functional and medical goals  Care Tool:  Bathing        Body parts bathed by helper: Right arm, Left arm, Chest, Abdomen, Front perineal area, Buttocks, Right upper leg, Left upper leg, Right lower leg, Left lower leg, Face     Bathing assist Assist Level: 2 Helpers     Upper Body Dressing/Undressing Upper body dressing   What is the patient wearing?: Pull over shirt    Upper body assist Assist Level: Total Assistance - Patient < 25%    Lower Body Dressing/Undressing Lower body dressing      What is the patient wearing?: Pants     Lower body assist Assist for lower body dressing: 2 Helpers     Toileting Toileting Toileting Activity did not occur Landscape architect and hygiene only): Refused  Toileting assist Assist for toileting: 2 Helpers     Transfers Chair/bed transfer  Transfers assist  Chair/bed transfer activity did not occur: Safety/medical concerns (safety/medical b/c we introduced new technique??)  Chair/bed transfer assist level: 2 Helpers Chair/bed transfer assistive device: Sliding board   Locomotion Ambulation   Ambulation assist   Ambulation activity did not occur: Safety/medical concerns          Walk 10 feet activity   Assist  Walk 10 feet activity did not occur: Safety/medical concerns        Walk 50 feet activity   Assist Walk 50 feet with 2 turns activity did not occur: Safety/medical concerns         Walk 150  feet activity   Assist Walk 150 feet activity did not occur: Safety/medical concerns         Walk 10 feet on uneven surface  activity   Assist Walk 10 feet on uneven surfaces activity did not occur: Safety/medical concerns         Wheelchair     Assist Will patient use wheelchair at discharge?:  (likely will require dependent w/c transport)      Wheelchair assist level: Dependent - Patient 0%      Wheelchair 50 feet with 2 turns activity    Assist         Assist Level: Dependent - Patient 0%   Wheelchair 150 feet activity     Assist     Assist Level: Dependent - Patient 0%      Medical Problem List and Plan: 1. Diffuse weakness with hypertonicity and deficits with self-care, swallowing, cognition secondary to TBI with significant Psych history.  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   Per neurology, repeat imaging ~8/2, labs ordered-folate within normal limits, vitamin B12-low normal, magnesium remains pending   B12 supplementation initiated  Chest x-ray personally reviewed, unremarkable for acute infectious process. 2.  Antithrombotics: -DVT/anticoagulation:  Pharmaceutical: Lovenox             -antiplatelet therapy: N/a 3. 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   Hydrocodone in place of tramadol for severe pain  Rx tone as below  7/30: tone better controlled  7/31- will change Klonopin to 1 mg q6 hours for spasticity, since no documentation about it/shouldn't increase-  4. Mood/agitation/hx of bipolar disorder: Neuropsych and psychiatry to follow during his stay. LCSW to follow for evaluation and support             -antipsychotic agents:  seroquel  -continue cymbalta, zoloft  -added gabapentin as above  -ativan BID discontinued.  -psych also following this patient although hasn't seen for some time  See # 14 5. Neuropsych: This patient is not fully capable of making decisions on his own behalf. 6. Skin/Wound Care: Routine pressure relief measures.  7. Fluids/Electrolytes/Nutrition:  11. Facial Fx/Maxillary fracture s/p repair with associated dysphagia: Wires removed by ENT             Dysphagia 1 thin diet--advance per SLP. PEG removed 0/10 without complications  12. ABLA:   Hemoglobin  12.8 on 7/19, 7/26 labs stable  Continue to monitor 13. Prediabetes: Hyperglycemia due to tube feeds. Continue feeds at nights as intake remains variable.    Dc cbg checks/SSI  14.  Severe muscle spasms with extensor tone:     Baclofen increased to 10 3 times daily on 7/11 without much effect, increased to 20 3 times daily on 7/26- tone is improved on 7/27  7/16 Dantrium trial without benefit, DC'd   7/21--  solid cast on patient per OT  -continue ROM with therapies, serial casting   Patient initially stated that [place] had incomplete benefit and was interested in changing to Valium, change made with patient noting further improvement, however significant increase in tone over the last couple days.  Suspect this is not related to medication and related to simultaneous UTI.  Per family  request, changed back to Klonopin.  7/30: improved with mother's massage, vibration device  7/31- increased to q6 hours at least for now 15.  Tachycardia:  Continue propanolol which was increased to 40 3 times daily on 7/11  See #14, 18  77/31- HR 76 this AM 16.  Transaminitis  ALT remains elevated on 7/23, continue to monitor 17.  Hypokalemia  Potassium 4.0 on 7/26  Stop supplementation 18.  Hypertension  Lisinopril 10 started on 7/21  Propanolol 40 3 times daily, increased to 60 3 times daily on 7/24  Elevated diastolic pressures on 2/01, consider further medication adjustment if persistent after treatment of infectious process 19.  Acute lower UTI with Klebsiella and Pseudomonas  Treated with fosfomycin, suspect incomplete treatment  IV Zosyn started on 7/26 20.  Episode of shortness of breath  Repeat chest x-ray ordered- stable  LOS: 22 days A FACE TO FACE EVALUATION WAS PERFORMED  Grete Bosko 08/10/2019, 2:37 PM

## 2019-08-10 NOTE — Progress Notes (Signed)
Attempted to bring patient down for MRI. Per RN, exam was ordered to be done on Monday (8/2); therefore, should wait until then.

## 2019-08-10 NOTE — Progress Notes (Signed)
Occupational Therapy Session Note  Patient Details  Name: Chase Caldwell MRN: [id number] Date of Birth: 1980/01/18  Today's Date: 08/10/2019 OT Individual Time: 1017-5102 OT Individual Time Calculation (min): 83 min    Short Term Goals: Week 3:  OT Short Term Goal 1 (Week 3): Pt will sit upright OOB for 3 hours to improve OOB tolerance OT Short Term Goal 2 (Week 3): Pt will thread 1UE into shirt wiht MIN A OT Short Term Goal 3 (Week 3): Pt will sit EOB with S overall OT Short Term Goal 4 (Week 3): Pt will complete transfer to w/c with LRAD and MAX A of 1 caregover consistently in prep for OOB toileting  Skilled Therapeutic Interventions/Progress Updates:    Pt seen this session in his room with his parents present and involved. They reported he had a busy morning working on transfers and they also are feeling very comfortable with using the hoyer lift.  They feel they have had multiple practice sessions.   Pt seen this session for family education on bed level therapeutic activities that could be done to encourage smoother movement patterns, disrupt tone patterns in LUE and RLE, and to encourage more active movement: -bent knees with knee rotations side to side passively, followed by a/arom with pt initiating movement 25% -bridging of hips with focus on keeping upper body relaxed, encouraged pt to keep hips and knees flexed during session to reduce extensor tone. -PROM to B hips -circular passive range of L shoulder, followed by external rotation with sh at 90 abduction (goal post stretch), then moving into gentle wrist extension -body on arm range with reaching R arm over head and then towards L wrist with trunk flexion and rotation to engage core -LUE PNF cross chop pattern a/arom -education with repeat demonstration of R foot dorsiflexion stretch -education on how to open hand to place in hand splint by extending wrist and stretching palm at thenar eminance, then extending fingers -Active  neck rotation as pt tends to keep head turned to his R  Pt did need frequent cues and redirection, but overall participated well.   They did mention that the new hand splint was coming off easily and that it may be a few days before he is recasted for his R foot.  Donned hand splint to observe what was happening.  When pt's spasticity kicks in and his wrist moves into flexion, the velcro straps pop off easily.  After PROM to wrist, applied splint and wrapped ACE wrap around it and it help pt's hand in place quite well.  Also donned bottom half of bivalve R foot cast and placed on pt's foot to reduce risk of losing the gains in ROM made with the serial casting.  Education with pt, parents and informed RN to remove both splints after 2-3 hours to check skin.  Leave off 1-2 hours, then reapply checking on skin every 3 hours or so.  Pt seemed to tolerate the splints well.    Pt resting in bed with family in room with all needs met.   Therapy Documentation Precautions:  Precautions Precautions: Fall, Other (comment) Precaution Comments: PEG, significant hypertonia, right ankle PF contracture Required Braces or Orthoses: Other Brace Other Brace: abdominal binder (to keep PEG safe) Restrictions Weight Bearing Restrictions: No  Pain: Pain Assessment Pain Score: 0-No pain ADL: ADL Eating: Dependent Where Assessed-Eating: Bed level Grooming: Dependent Where Assessed-Grooming: Wheelchair Upper Body Bathing: Dependent Where Assessed-Upper Body Bathing: Wheelchair Lower Body Bathing: Dependent Where Assessed-Lower  Body Bathing: Edge of bed Upper Body Dressing: Dependent Where Assessed-Upper Body Dressing: Wheelchair Lower Body Dressing: Dependent Toileting: Dependent Toilet Transfer: Not assessed Gaffer Transfer: Not assessed  Therapy/Group: Individual Therapy  [place] 08/10/2019, 3:24 PM

## 2019-08-11 ENCOUNTER — Inpatient Hospital Stay (HOSPITAL_COMMUNITY): Payer: Medicaid Other

## 2019-08-11 NOTE — Progress Notes (Signed)
Physical Therapy Session Note  Patient Details  Name: Chase Caldwell MRN: [id number] Date of Birth: 06-03-1980  Today's Date: 08/10/2019 PT Individual Time: 1000-1105 PT Individual Time Calculation: 65 min  Short Term Goals: Week 3:  PT Short Term Goal 1 (Week 3): Patient will initiate power w/c mobility. PT Short Term Goal 2 (Week 3): Patient will perform bed mobility with max A +1 consistently. PT Short Term Goal 3 (Week 3): Patient will perform basic transfer with max A +1.  Skilled Therapeutic Interventions/Progress Updates:     Patient in bed with his wife and parents at bedside upon PT arrival. Patient alert and agreeable to PT session. Patient reported 4-5/10 L intermittent wrist pain during session, RN made aware. PT provided repositioning, rest breaks, and distraction as pain interventions throughout session. [name] family participated in family education throughout session.   Therapeutic Activity: Bed Mobility: Patient performed supine to sit with min A and increased time due to UE tone. Provided verbal cues for bringing LEs off the bed and rolling to his R side to push up with his R UE to come to sitting. Maintained sitting balance EOB with CGA-close supervision with intermittent posterior bias, able to self-correct with cues.  Transfers: Patient performed a slide board transfer bed>[name] with mod A of 1 and SBA of a second person, patient's father, for safety and total A for board placement. Provided cues for hand placement, board placement, and head-hips relationship for proper technique and decreased assist with transfers. Educated patient's family on use of hoyer lift versus slide board at home. Discussed patients fluctuation with level of assist for transfers and systemic tone making slide board transfer more challenging and increasing safety concerns for patient and family. [name] family stated understanding. Will provide a slide board at d/c to allow for patient to continue  progressing to use of this device with follow-up therapies.   Wheelchair Mobility:  Patient was transported in the [place] w/c with total A throughout session for energy conservation and time management.  Neuromuscular Re-ed: Trailed standing frame with patient to assess use for tone management, strengthening, bone density, and use of UEs. Patient performed 1/2 stand with max A to place sling under his hips with total A with UEs placed on table in front of him. Performed standing x2, with heavy cues for erect posture and manual facilitation at R ankle to prevent eversion. Second person guiding UEs on table and providing vibration and massage to reduce L UE tone throughout. Patient unable to tolerate >30 sec each trial due to R ankle discomfort and returned to sitting. Will discuss further serial casting versus bracing with rehab team for R ankle management.   Patient able to correctly identify therapist and all 3 family members location when standing across the room. Identified the correct color of 3/4 shirts and described striped and floral patterns of 2 shirts during session.  Patient in [place] w/c with his family in the room at end of session with breaks locked, seat belt fastened, and all needs within reach.   Therapy Documentation Precautions:  Precautions Precautions: Fall, Other (comment) Precaution Comments: PEG, significant hypertonia, right ankle PF contracture Required Braces or Orthoses: Other Brace Other Brace: abdominal binder (to keep PEG safe) Restrictions Weight Bearing Restrictions: No   Therapy/Group: Individual Therapy  Gearl Kimbrough L Chicquita Mendel PT, DPT  08/10/2019, 5:03 PM

## 2019-08-11 NOTE — Progress Notes (Signed)
Physical Therapy Session Note  Patient Details  Name: Chase Caldwell MRN: [id number] Date of Birth: 09-22-1980  Today's Date: 08/11/2019 PT Individual Time: 1250-1315 and 1100-12:00 PT Individual Time Calculation (min): 25 min  And 20mn Short Term Goals: Week 1:  PT Short Term Goal 1 (Week 1): Pt will perform supine>sit with max assist of 1 person PT Short Term Goal 1 - Progress (Week 1): Progressing toward goal PT Short Term Goal 2 (Week 1): Pt will perform sit>supine with +2 max assist PT Short Term Goal 2 - Progress (Week 1): Met PT Short Term Goal 3 (Week 1): Pt will perform bed<>chair transfers with +2 max assist PT Short Term Goal 3 - Progress (Week 1): Met PT Short Term Goal 4 (Week 1): Pt will perform sit<>stands with +2 max assist PT Short Term Goal 4 - Progress (Week 1): Met PT Short Term Goal 5 (Week 1): Pt will maintain static sitting balance EOM for at least 1 minute with supervision PT Short Term Goal 5 - Progress (Week 1): Met Week 2:  PT Short Term Goal 1 (Week 2): Patient will perform supine to sit with max A +1 consistently. PT Short Term Goal 1 - Progress (Week 2): Met PT Short Term Goal 2 (Week 2): Patient will perform sit to supine with mod A +2. PT Short Term Goal 2 - Progress (Week 2): Progressing toward goal PT Short Term Goal 3 (Week 2): Patient will perform basic transfers with mod A +2. PT Short Term Goal 3 - Progress (Week 2): Progressing toward goal PT Short Term Goal 4 (Week 2): Patient will initiate power w/c mobility. PT Short Term Goal 4 - Progress (Week 2): Progressing toward goal Week 3:  PT Short Term Goal 1 (Week 3): Patient will initiate power w/c mobility. PT Short Term Goal 2 (Week 3): Patient will perform bed mobility with max A +1 consistently. PT Short Term Goal 3 (Week 3): Patient will perform basic transfer with max A +1.  Skilled Therapeutic Interventions/Progress Updates:    first session: PAIN: Pt c/o pain in standing frame limiting  overall tolerance, pain in L knee, R ankle, became agitated and angry w/attempts to reposition during second standing trial, returned to sitting after each standing trial.  Complete relief when out of standing.  Pt initially sitting in bed w/wife at bedside. Agreeable to session and discussed prior day standing activity w/wife. Pt returned to supine as parents arrived in room.  Handsplint/soft applied to LUE.  Pt   supine to sit w/mod assist of 1 and cues for contolling movement/moves rapidly/unsafely due to limited ovearall balance/trunk control.  Pt worked on scooting in sitting w/max assist and multimodal cues to facilitate wt shifting.  Family present to observe SBT to wc/discussed set up/safety.  Father assisted w/sb and stabilizing wc. SBT bed to wc w/max assist of 1, second person/father stabilizing board and wc. In sitting worked on wt shifting and scooting his hips back in chair, mod assist for safety.  Pt then transported to ortho gym for continued session w/focus on standing frame, trunk contro, upright posture, and positioning R ankle for wbing/stretching. Pt able to wt shift AP but does so in ballistic manner requiring therapist blocking ranges of movement for safety.  Harness donned by therapist. Build up applied under R heel to promote wbing thru full plantar aspect of foot and w/mild eversion bias. Pt able to wt shift trunk anteriiorly in preparation for transition.  Pt brought to standing w/mechanical lift and  therapist guarding ankle, parents assisting w/trunk/upper body positioning w/transition.  In standing worked on upright cervical posture/trunk/hip extension.  Pt requires max assist for wt shifting at hips and max assist to achieve full upright trunk posture w/arms on traytable.  Pt tolerated standing 2-3 min, limited by c/o L knee pain.    Rested in sitting then repeated standing as above, but tolerated max 1.38mn due to L knee and R ankle pain.  Became agitated/returned to  sitting.    Pt then instructed w/retropulsion of wc using bilat LEs, attempted alternating use but achieves more fluid/coordinated movement when using LEs in sync.  Pt does require max assist to control rate of propulsion/moves impulsively and ballistically at times.  Also requires careful guarding of trunk to prevent excessive ant wt shift/ant loss of balance during propulsion.  At end of session pt encouraged to remain OOB in wc.  Pt stated he would not bc it was difficult to arrange transfer back to bed.  Parents requested therapist return and educate them re use of sliding board.  Agreed to demonstrate again, but emphasized this was a transfer requiring significnat skill at this time.  Pt agreed to remain OOB in wc until 12:45-1:00pm when therapist could return.   Second Session:  PAIN Pt c/o back pain from spasms/sitting.  See below for transfer to bed.  Pt mother/father seen for additional session of requested family training w/SBT. Therapist instructed mother and demonstrated transfer using mother as pt to facilitate understanding of mechanics/safety.  Therapist then discussed/demonstrated SBT from wc to bed w/pt w/uphill bias and father assisting w/stabilizing SB w/transfer.  Discussed set up for safety,proper body mechanics,  blocking pt for safety esp in event of extensor tone/to prevent sliding/fall then performed SBT wc to bed heavy max assist.  Sit to supine w/cues and mod assist.   Then discussed use of hospital bed features/raising bed to protect caregivers back, and assisted parents w/sliding pt up in bed.  Once pt positioned comfortably, he spontaneously sat up to ring sitting w/cga.  Parents educated on use of fall mats, additional bed set up/use of pillows/lateral support for safety/reduce fall risk.   Pt left sitting in bed w/rails up x 4, alarm set, bed in lowest position, additional support and mats as above, parents at bedside and needs in reach.   Therapy  Documentation Precautions:  Precautions Precautions: Fall, Other (comment) Precaution Comments: PEG, significant hypertonia, right ankle PF contracture Required Braces or Orthoses: Other Brace Other Brace: abdominal binder (to keep PEG safe) Restrictions Weight Bearing Restrictions: No    Therapy/Group: Individual Therapy  BCallie Fielding PC[place]point8/01/2019, 4:13 PM

## 2019-08-11 NOTE — Progress Notes (Signed)
[facility] PHYSICAL MEDICINE & REHABILITATION PROGRESS NOTE  Subjective/Complaints:  Pt reports increased frequency of klonopin has been very helpful- excited about change- says feels better.   Appears a little sleepy this AM, but noted today is "day off" from therapy.    ROS:   Pt denies SOB, abd pain, CP, N/V/C/D, and vision changes  Objective: Vital Signs: Blood pressure (!) 116/96, pulse 82, temperature 97.8 F (36.6 C), resp. rate 16, height 6' (1.829 m), weight (!) 118.6 kg, SpO2 100 %. No results found. No results for input(s): WBC, HGB, HCT, PLT in the last 72 hours. No results for input(s): NA, K, CL, CO2, GLUCOSE, BUN, CREATININE, CALCIUM in the last 72 hours.  Physical Exam: BP (!) 116/96 (BP Location: Right Arm)   Pulse 82   Temp 97.8 F (36.6 C)   Resp 16   Ht 6' (1.829 m)   Wt (!) 118.6 kg   SpO2 100%   BMI 35.46 kg/m  General: No apparent distress-sitting up in bed- sleepy, napping, NAD HEENT: not making eye contact- dysconjugate gaze- no change Neck: Supple without JVD or lymphadenopathy Heart:  RRR Chest; CTA B/L- no W/R/R- good air movement Abdomen: Soft, NT, ND, (+)BS  Extremities: No clubbing, cyanosis, or edema. Pulses are 2+ Skin: PEG removed. PEG site C/D/I- no change No edema in extremities.   Neuro: sleepy, but no therapy today Facial paralysis, stable Vision remains limited Significant generalized extensor tone  Motor: Limited due to tone, however moving all extremities  Assessment/Plan: 1. 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  Physiatrist is providing close team supervision and 24 hour management of active medical problems listed below.  Physiatrist and rehab team continue to assess barriers to discharge/monitor patient progress toward functional and medical goals  Care Tool:  Bathing         Body parts bathed by helper: Right arm, Left arm, Chest, Abdomen, Front perineal area, Buttocks, Right upper leg, Left upper leg, Right lower leg, Left lower leg, Face     Bathing assist Assist Level: 2 Helpers     Upper Body Dressing/Undressing Upper body dressing   What is the patient wearing?: Pull over shirt    Upper body assist Assist Level: Total Assistance - Patient < 25%    Lower Body Dressing/Undressing Lower body dressing      What is the patient wearing?: Pants     Lower body assist Assist for lower body dressing: 2 Helpers     Toileting Toileting Toileting Activity did not occur Landscape architect and hygiene only): Refused  Toileting assist Assist for toileting: 2 Helpers     Transfers Chair/bed transfer  Transfers assist  Chair/bed transfer activity did not occur: Safety/medical concerns (safety/medical b/c we introduced new technique??)  Chair/bed transfer assist level: 2 Helpers Chair/bed transfer assistive device: Sliding board   Locomotion Ambulation   Ambulation assist   Ambulation activity did not occur: Safety/medical concerns          Walk 10 feet activity   Assist  Walk 10 feet activity did not occur: Safety/medical concerns        Walk 50 feet activity   Assist Walk 50 feet with 2 turns activity did not occur: Safety/medical concerns         Walk 150 feet activity   Assist Walk 150 feet activity did not occur: Safety/medical concerns  Walk 10 feet on uneven surface  activity   Assist Walk 10 feet on uneven surfaces activity did not occur: Safety/medical concerns         Wheelchair     Assist Will patient use wheelchair at discharge?:  (likely will require dependent w/c transport)      Wheelchair assist level: Dependent - Patient 0%      Wheelchair 50 feet with 2 turns activity    Assist        Assist Level: Dependent - Patient 0%   Wheelchair 150 feet activity      Assist     Assist Level: Dependent - Patient 0%      Medical Problem List and Plan: 1. Diffuse weakness with hypertonicity and deficits with self-care, swallowing, cognition secondary to TBI with significant Psych history.  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   Per neurology, repeat imaging ~8/2, labs ordered-folate within normal limits, vitamin B12-low normal, magnesium remains pending   B12 supplementation initiated  Chest x-ray personally reviewed, unremarkable for acute infectious process. 2.  Antithrombotics: -DVT/anticoagulation:  Pharmaceutical: Lovenox             -antiplatelet therapy: N/a 3. 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   Hydrocodone in place of tramadol for severe pain  Rx tone as below  7/30: tone better controlled  7/31- will change Klonopin to 1 mg q6 hours for spasticity, since no documentation about it/shouldn't increase-   8/1- doing better with tremors/tone with increase klonopin 4. Mood/agitation/hx of bipolar disorder: Neuropsych and psychiatry to follow during his stay. LCSW to follow for evaluation and support             -antipsychotic agents:  seroquel  -continue cymbalta, zoloft  -added gabapentin as above  -ativan BID discontinued.  -psych also following this patient although hasn't seen for some time  See # 14 5. Neuropsych: This patient is not fully capable of making decisions on his own behalf. 6. Skin/Wound Care: Routine pressure relief measures.  7. Fluids/Electrolytes/Nutrition:  11. Facial Fx/Maxillary fracture s/p repair with associated dysphagia: Wires removed by ENT             Dysphagia 1 thin diet--advance per SLP. PEG removed 4/09 without complications  12. ABLA:   Hemoglobin 12.8 on 7/19,  7/26 labs stable  Continue to monitor 13. Prediabetes: Hyperglycemia due to tube feeds. Continue feeds at nights as intake remains variable.    Dc cbg checks/SSI  14.  Severe muscle spasms with extensor tone:     Baclofen increased to 10 3 times daily on 7/11 without much effect, increased to 20 3 times daily on 7/26- tone is improved on 7/27  7/16 Dantrium trial without benefit, DC'd   7/21--  solid cast on patient per OT  -continue ROM with therapies, serial casting   Patient initially stated that [place] had incomplete benefit and was interested in changing to Valium, change made with patient noting further improvement, however significant increase in tone over the last couple days.  Suspect this is not related to medication and related to simultaneous UTI.  Per family request, changed back to Klonopin.  7/30: improved with mother's massage, vibration device  7/31- increased to q6 hours at least for now  8/1- doing better per pt 15.  Tachycardia:  Continue propanolol which was increased to 40 3 times daily on 7/11  See #14, 18  77/31- HR 76 this AM 16.  Transaminitis  ALT remains elevated on 7/23, continue to monitor 17.  Hypokalemia  Potassium 4.0 on 7/26  Stop supplementation 18.  Hypertension  Lisinopril 10 started on 7/21  Propanolol 40 3 times daily, increased to 60 3 times daily on 7/24  Elevated diastolic pressures on 4/00, consider further medication adjustment if persistent after treatment of infectious process  8/1- 116/96 this AM, however has been 110s/80s in last 24 hours otherwise- won't make med changes today 19.  Acute lower UTI with Klebsiella and Pseudomonas  Treated with fosfomycin, suspect incomplete treatment  IV Zosyn started on 7/26 20.  Episode of shortness of breath  Repeat chest x-ray ordered- stable  LOS: 23 days A FACE TO FACE EVALUATION WAS PERFORMED  Tuan Tippin 08/11/2019, 2:27 PM

## 2019-08-12 ENCOUNTER — Inpatient Hospital Stay (HOSPITAL_COMMUNITY): Payer: Medicaid Other

## 2019-08-12 ENCOUNTER — Inpatient Hospital Stay (HOSPITAL_COMMUNITY): Payer: Medicaid Other | Admitting: Speech Pathology

## 2019-08-12 LAB — BASIC METABOLIC PANEL
Anion gap: 11 (ref 5–15)
BUN: 12 mg/dL (ref 6–20)
CO2: 22 mmol/L (ref 22–32)
Calcium: 9 mg/dL (ref 8.9–10.3)
Chloride: 104 mmol/L (ref 98–111)
Creatinine, Ser: 0.69 mg/dL (ref 0.61–1.24)
GFR calc Af Amer: >60 mL/min (ref >60)
GFR calc non Af Amer: >60 mL/min (ref >60)
Glucose, Bld: 80 mg/dL (ref 70–99)
Potassium: 4.4 mmol/L (ref 3.5–5.1)
Sodium: 137 mmol/L (ref 135–145)

## 2019-08-12 LAB — CBC
HCT: 41.3 % (ref 39.0–52.0)
Hemoglobin: 12.9 g/dL — ABNORMAL LOW (ref 13.0–17.0)
MCH: 27.7 pg (ref 26.0–34.0)
MCHC: 31.2 g/dL (ref 30.0–36.0)
MCV: 88.6 fL (ref 80.0–100.0)
Platelets: 299 10*3/uL (ref 150–400)
RBC: 4.66 MIL/uL (ref 4.22–5.81)
RDW: 13.1 % (ref 11.5–15.5)
WBC: 5.4 10*3/uL (ref 4.0–10.5)
nRBC: 0 % (ref 0.0–0.2)

## 2019-08-12 NOTE — Progress Notes (Signed)
Speech Language Pathology Daily Session Note  Patient Details  Name: Rondey Fallen MRN: [id number] Date of Birth: [birth year]  Today's Date: 08/12/2019 SLP Individual Time: 6886-4847 SLP Individual Time Calculation (min): 30 min  Short Term Goals: Week 4: SLP Short Term Goal 1 (Week 4): STG=LTG due to remaining LOS  Skilled Therapeutic Interventions: Skilled treatment session focused on speech goals. SLP facilitated session by providing Min A verbal cues for recall of his current speech intelligibility strategies and supervision verbal cues for use of the strategies at the sentence level to achieve ~90% intelligibility. Patient left upright in bed with alarm on and all needs within reach. Continue with current plan of care.      Pain No/Denies Pain   Therapy/Group: Individual Therapy  Amariyana Heacox 08/12/2019, 3:03 PM

## 2019-08-12 NOTE — Progress Notes (Signed)
Physical Therapy Weekly Progress Note  Patient Details  Name: Chase Caldwell MRN: [id number] Date of Birth: 10-12-1980  Beginning of progress report period: August 04, 2019 End of progress report period: August 12, 2019  Today's Date: 08/12/2019 PT Individual Time: 0350-0938 PT Individual Time Calculation (min): 61 min   Patient has met 2 of 3 short term goals.  Patient with slow, steady progress limited by systemic hypertonia, worse in L UE and R LE. Patient currently requires mod A for bed mobility, except +2 assist for sit to supine with fatigue or increased tone, skilled mod-max A of 1 person for slide board transfers with a second person SBA and stabilizing w/c and board for safety, continues to require max A+2 for standing. Have initiated hoyer lift training with patient's family for safe transfers at home and standing frame for improved standing tolerance and LE tone management. W/c evaluation scheduled for Tuesday to assess potential for power mobility.  Patient continues to demonstrate the following deficits muscle weakness and muscle joint tightness, decreased cardiorespiratoy endurance, impaired timing and sequencing, abnormal tone, unbalanced muscle activation and decreased coordination, decreased visual acuity and decreased visual perceptual skills and decreased sitting balance, decreased standing balance, decreased postural control and decreased balance strategies and therefore will continue to benefit from skilled PT intervention to increase functional independence with mobility.  Patient progressing toward long term goals..  Plan of care revisions: D/c car transfer goal due to lack of progress, downgraded standing goal due to slow progress secondary to tone, and added hoyer lift transfer goal for family for safe transfers at home..  PT Short Term Goals Week 3:  PT Short Term Goal 1 (Week 3): Patient will initiate power w/c mobility. PT Short Term Goal 1 - Progress (Week 3): Progressing  toward goal PT Short Term Goal 2 (Week 3): Patient will perform bed mobility with max A +1 consistently. PT Short Term Goal 2 - Progress (Week 3): Met PT Short Term Goal 3 (Week 3): Patient will perform basic transfer with max A +1. PT Short Term Goal 3 - Progress (Week 3): Met Week 4:  PT Short Term Goal 1 (Week 4): STG=LTG due to ELOS.  Skilled Therapeutic Interventions/Progress Updates:     Patient in bed, half sitting and half lying, due to increased flexor tone upon PT arrival. Patient alert and agreeable to PT session. Patient reported 7-8/10 low back pain during session, RN made aware and provided pain medicine during session. PT provided repositioning, rest breaks, and distraction as pain interventions throughout session. Focused session on positioning and tone management for strategies for patient comfort and reduced caregiver burden at d/c.  Therapeutic Activity: Bed Mobility: Patient performed rolling R with min-mod A and supine to sit with min A. Provided verbal cues for bringing knees to chest to facilitate rolling and bringing LEs off the bed then pushing up with bottom UE to elbow then hand to sit. Performed sit to supine with max +2 due to increased tone, fatigue, and back pain limiting mobility this session. Patient frequently coming to half-full sit with LEs crossed and flexed when in supine due to increased flexor tone. Required manual pressure at thighs and shoulders to reduced tone and return to lying several times during session.   Neuromuscular Re-ed: Patient performed the following UE and LE motor control activities to reduce increased tone: -sitting EOB with min A-supervision for sitting balance, performed DF stretch on floor then on 4" step with over pressure at the knee to place  heel on the floor or step, started with knee >90 degrees flexion to accommodate for PF tone then progressed closer to 90 degrees for increased stretch, patient with significantly flexed posture and  poor postural control throughout and reported increased low back pain in sitting during exercise -LE on trunk rotation in hood-lying with manual facilitation at patient's knees for reduce trunk tone and low back pain with good results, however both resumed once facilitation was stopped -applied 2 lb ankle weight to L wrist and 3 lb ankle weight placed over L shoulder/upper arm with improved spasms and reduced UE extensor tone, educated family on use of home-made weighted "hot packs" using socks and rice to include heat therapy and weighted pressure over UE at home for reduced tone and reduced caregiver burden for tone/pain management at home, educated on importance of maintaining skin integrity with heat -performed D1 UE pattern to L UE with AAROM and use of quick stretch to initiate movement, also used vibration when tone was more difficult to break throughout session  Patient in bed with his family at bedside at end of session with breaks locked, bed alarm set, and all needs within reach. Determined w/c evaluation with [name] from [facility] tomorrow morning and plan for family education for w/c and hoyer lift training this week during session.    Therapy Documentation Precautions:  Precautions Precautions: Fall, Other (comment) Precaution Comments: PEG, significant hypertonia, right ankle PF contracture Required Braces or Orthoses: Other Brace Other Brace: abdominal binder (to keep PEG safe) Restrictions Weight Bearing Restrictions: No   Therapy/Group: Individual Therapy  [name] L Grunenberg PT, DPT  08/12/2019, 5:07 PM

## 2019-08-12 NOTE — Plan of Care (Signed)
  Problem: Sit to Stand Goal: LTG:  Patient will perform sit to stand with assistance level (PT) Description: LTG:  Patient will perform sit to stand with assistance level (PT) Flowsheets (Taken 08/12/2019 1700) LTG: PT will perform sit to stand in preparation for functional mobility with assistance level: (Downgraded goal due to limited progress secondary to systemic hypertonia) 2 Helpers Note: Downgraded goal due to limited progress secondary to systemic hypertonia   Problem: RH Car Transfers Goal: LTG Patient will perform car transfers with assist (PT) Description: LTG: Patient will perform car transfers with assistance (PT). Outcome: Not Progressing Flowsheets (Taken 08/12/2019 1749) LTG: Pt will perform car transfers with assist:: (D/c goal due tolimited progress secondary to systemic hypertonia) -- Note: D/c goal due to limited progress secondary to systemic hypertonia   Problem: RH Pre-functional/Other (Specify) Goal: RH LTG PT (Specify) 1 Description:  RH LTG PT (Specify) 1 Flowsheets (Taken 08/12/2019 4496) LTG: Other PT (Specify) 1: [name] family will demonstrate use of hoyer lift with +2 assist independently for safe home transfers.

## 2019-08-12 NOTE — Progress Notes (Signed)
[facility] PHYSICAL MEDICINE & REHABILITATION PROGRESS NOTE  Subjective/Complaints: Weekend notes reviewed.  Labs stable.  Vitals stable.  Is happy with increased frequency of Klonopin.   ROS:  Pt denies SOB, abd pain, CP, N/V/C/D, and vision changes  Objective: Vital Signs: Blood pressure 120/80, pulse 66, temperature 97.6 F (36.4 C), resp. rate 20, height 6' (1.829 m), weight (!) 118.6 kg, SpO2 97 %. No results found. Recent Labs    08/12/19 0518  WBC 5.4  HGB 12.9*  HCT 41.3  PLT 299   Recent Labs    08/12/19 0518  NA 137  K 4.4  CL 104  CO2 22  GLUCOSE 80  BUN 12  CREATININE 0.69  CALCIUM 9.0    Physical Exam: BP 120/80   Pulse 66   Temp 97.6 F (36.4 C)   Resp 20   Ht 6' (1.829 m)   Wt (!) 118.6 kg   SpO2 97%   BMI 35.46 kg/m  General: Alert, No apparent distress HEENT: Head is normocephalic, atraumatic, PERRLA, EOMI, sclera anicteric, oral mucosa pink and moist, dentition intact, ext ear canals clear,  Neck: Supple without JVD or lymphadenopathy Heart: Reg rate and rhythm. No murmurs rubs or gallops Chest: CTA bilaterally without wheezes, rales, or rhonchi; no distress Abdomen: Soft, non-tender, non-distended, bowel sounds positive. Extremities: No clubbing, cyanosis, or edema. Pulses are 2+ Skin: PEG removed. PEG site C/D/I- no change No edema in extremities.   Neuro: sleepy, but no therapy today Facial paralysis, stable Vision remains limited Significant generalized extensor tone  Motor: Limited due to tone, however moving all extremities  Assessment/Plan: 1. 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  Physiatrist is providing close team supervision and 24 hour management of active medical problems listed below.  Physiatrist and rehab team continue to assess barriers to discharge/monitor patient progress  toward functional and medical goals  Care Tool:  Bathing        Body parts bathed by helper: Right arm, Left arm, Chest, Abdomen, Front perineal area, Buttocks, Right upper leg, Left upper leg, Right lower leg, Left lower leg, Face     Bathing assist Assist Level: 2 Helpers     Upper Body Dressing/Undressing Upper body dressing   What is the patient wearing?: Pull over shirt    Upper body assist Assist Level: Total Assistance - Patient < 25%    Lower Body Dressing/Undressing Lower body dressing      What is the patient wearing?: Pants     Lower body assist Assist for lower body dressing: 2 Helpers     Toileting Toileting Toileting Activity did not occur Landscape architect and hygiene only): Refused  Toileting assist Assist for toileting: 2 Helpers     Transfers Chair/bed transfer  Transfers assist  Chair/bed transfer activity did not occur: Safety/medical concerns (safety/medical b/c we introduced new technique??)  Chair/bed transfer assist level: 2 Helpers Chair/bed transfer assistive device: Sliding board   Locomotion Ambulation   Ambulation assist   Ambulation activity did not occur: Safety/medical concerns          Walk 10 feet activity   Assist  Walk 10 feet activity did not occur: Safety/medical concerns        Walk 50 feet activity   Assist Walk 50 feet with 2 turns activity did not occur: Safety/medical concerns         Walk 150 feet  activity   Assist Walk 150 feet activity did not occur: Safety/medical concerns         Walk 10 feet on uneven surface  activity   Assist Walk 10 feet on uneven surfaces activity did not occur: Safety/medical concerns         Wheelchair     Assist Will patient use wheelchair at discharge?:  (likely will require dependent w/c transport)      Wheelchair assist level: Maximal Assistance - Patient 25 - 49% Max wheelchair distance: 35ft    Wheelchair 50 feet with 2 turns  activity    Assist        Assist Level: Total Assistance - Patient < 25% (total assist for turning, max assist for controll w/retropulsion)   Wheelchair 150 feet activity     Assist     Assist Level: Dependent - Patient 0%    Medical Problem List and Plan: 1. Diffuse weakness with hypertonicity and deficits with self-care, swallowing, cognition secondary to TBI with significant Psych history.   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   Per neurology, repeat imaging ~8/2, labs ordered-folate within normal limits, vitamin B12-low normal, magnesium remains pending  B12 supplementation initiated  Chest x-ray personally reviewed, unremarkable for acute infectious process. 2.  Antithrombotics: -DVT/anticoagulation:  Pharmaceutical: Lovenox             -antiplatelet therapy: N/a 3. Chronic LBP/Pain Management:                8/2: Well controlled with Tylenol and Neurontin scheduled, and Norco PRN.  4. Mood/agitation/hx of bipolar disorder: Neuropsych and psychiatry to follow during his stay. LCSW to follow for evaluation and support             -antipsychotic agents:  seroquel  -continue cymbalta, zoloft  -added gabapentin as above  -ativan BID discontinued.  -psych also following this patient although hasn't seen for some time  See # 14 5. Neuropsych: This patient is not fully capable of making decisions on his own behalf. 6. Skin/Wound Care: Routine pressure relief measures.  7. Fluids/Electrolytes/Nutrition:  11. Facial Fx/Maxillary fracture s/p repair with associated dysphagia: Wires removed by ENT             Dysphagia 1 thin diet--advance per SLP. PEG removed 3/26 without complications  12. ABLA:   8/2: Hgb is 12.9  Continue to monitor 13. Prediabetes: Hyperglycemia due to tube feeds. Continue feeds at nights as intake remains variable.    Dc cbg checks/SSI  14.  Severe muscle spasms with extensor tone: better  controlled with Baclofen, serial casting, ROM with therapies, Klonopin.  15.  Tachycardia:  Continue propanolol which was increased to 40 3 times daily on 7/11  See #14, 18  HR has been been controlled.  16.  Transaminitis  ALT elevated on 7/23. Repeat in outpatient labs.  17.  Hypokalemia  Now stable off supplementation  18.  Hypertension: stable with Lisinopril and Propanolol.  19.  Acute lower UTI with Klebsiella and Pseudomonas: treatment completed   LOS: 24 days A FACE TO FACE EVALUATION WAS PERFORMED  Clide Deutscher Max Nuno 08/12/2019, 9:47 AM

## 2019-08-12 NOTE — Progress Notes (Signed)
Occupational Therapy Session Note  Patient Details  Name: Chase Caldwell MRN: [id number] Date of Birth: [birth year]  Today's Date: 08/12/2019 OT Individual Time: 0945-1100 OT Individual Time Calculation (min): 75 min   Session 2: OT Individual Time: 1305-1330 OT Individual Time Calculation (min): 25 min    Short Term Goals: Week 4:  OT Short Term Goal 1 (Week 4): STG=LTG d/t ELOS  Skilled Therapeutic Interventions/Progress Updates:    Pt received supine in bed, in full BLE/UE flexor tone, family attempting to assist. Focus of session on family education re PROM and positioning. Pt's bed was adjusted so he was in a more sitting position and the tone was immediately decreased. Full body PROM was completed with teach back for his parents. Provided extensive education on tones relationship with positioning. Shorts were donned max A at bed level. Pt completed slideboard transfer to the w/c with max +2 assist. Pt was taken via [name] to the therapy gym. Pt completed functional reaching tasks to increase proprioception and voluntary control of his BUE. Pt reporting pain in his L wrist with PROM/facilitation into flexion. Rest breaks provided to alleviate pain. Discussed TBI education with parents. Pt completed trunk flexion and extension from TIS with mod cueing. Pt returned to his room and was given a ginger ale.   Session 2:  Pt received supine in bed agreeable to session. Pt agreeable to attempt prone in bed positioning to assist with tone reduction. Pt required max +2 assist to be positioned in prone with several adjustments made to attempt and increase comfort. Pt briefly comfortable before stating "get me out of here" and stating pain in his L wrist and shoulder. Pt was returned to supine where he was positioned in more upright position to reduce tone in the trunk. Pillows used to position pt in neutral alignment. Pt passed off to PT in room.   Therapy Documentation Precautions:   Precautions Precautions: Fall, Other (comment) Precaution Comments: PEG, significant hypertonia, right ankle PF contracture Required Braces or Orthoses: Other Brace Other Brace: abdominal binder (to keep PEG safe) Restrictions Weight Bearing Restrictions: No   Therapy/Group: Individual Therapy  [name] 08/12/2019, 6:41 AM

## 2019-08-12 NOTE — Progress Notes (Signed)
Patient contracted, unable to lay flat, and unable to control extremities. Per patient, MRI needs to be done under anesthesia which is best course of action due to his current condition.

## 2019-08-13 ENCOUNTER — Inpatient Hospital Stay (HOSPITAL_COMMUNITY): Payer: Medicaid Other

## 2019-08-13 ENCOUNTER — Inpatient Hospital Stay (HOSPITAL_COMMUNITY): Payer: Medicaid Other | Admitting: Physical Therapy

## 2019-08-13 DIAGNOSIS — S069X2S Unspecified intracranial injury with loss of consciousness of 31 minutes to 59 minutes, sequela: Secondary | ICD-10-CM

## 2019-08-13 DIAGNOSIS — G931 Anoxic brain damage, not elsewhere classified: Secondary | ICD-10-CM

## 2019-08-13 NOTE — Plan of Care (Signed)
  Problem: Consults Goal: RH BRAIN INJURY PATIENT EDUCATION Description: Description: See Patient Education module for eduction specifics Outcome: Progressing Goal: Skin Care Protocol Initiated - if Braden Score 18 or less Description: If consults are not indicated, leave blank or document N/A Outcome: Progressing Goal: Nutrition Consult-if indicated Outcome: Progressing   Problem: RH BOWEL ELIMINATION Goal: RH STG MANAGE BOWEL WITH ASSISTANCE Description: STG Manage Bowel with mod I Assistance. Outcome: Progressing Goal: RH STG MANAGE BOWEL W/MEDICATION W/ASSISTANCE Description: STG Manage Bowel with Medication with min Assistance. Outcome: Progressing   Problem: RH BLADDER ELIMINATION Goal: RH STG MANAGE BLADDER WITH ASSISTANCE Description: STG Manage Bladder With mod I Assistance Outcome: Progressing Goal: RH STG MANAGE BLADDER WITH MEDICATION WITH ASSISTANCE Description: STG Manage Bladder With Medication With min Assistance. Outcome: Progressing   Problem: RH SKIN INTEGRITY Goal: RH STG SKIN FREE OF INFECTION/BREAKDOWN Description: Skin to remain free from infection and breakdown with min assist while on rehab. Outcome: Progressing Goal: RH STG MAINTAIN SKIN INTEGRITY WITH ASSISTANCE Description: STG Maintain Skin Integrity With min Assistance. Outcome: Progressing Goal: RH STG ABLE TO PERFORM INCISION/WOUND CARE W/ASSISTANCE Description: STG Able To Perform Incision/Wound Care With min Assistance. Outcome: Progressing   Problem: RH SAFETY Goal: RH STG ADHERE TO SAFETY PRECAUTIONS W/ASSISTANCE/DEVICE Description: STG Adhere to Safety Precautions With mod I Assistance and appropriate assistive Device. Outcome: Progressing   Problem: RH COGNITION-NURSING Goal: RH STG USES MEMORY AIDS/STRATEGIES W/ASSIST TO PROBLEM SOLVE Description: STG Uses Memory Aids/Strategies With cues and reminder Assistance to Problem Solve. Outcome: Progressing Goal: RH STG ANTICIPATES  NEEDS/CALLS FOR ASSIST W/ASSIST/CUES Description: STG Anticipates Needs/Calls for Assist With Cues and reminders. Outcome: Progressing   Problem: RH PAIN MANAGEMENT Goal: RH STG PAIN MANAGED AT OR BELOW PT'S PAIN GOAL Description: <3 on a 0-10 pain scale. Outcome: Progressing   Problem: RH KNOWLEDGE DEFICIT BRAIN INJURY Goal: RH STG INCREASE KNOWLEDGE OF SELF CARE AFTER BRAIN INJURY Description: Patient and caregiver will demonstrate knowledge of medication management, wound care management, dietary restrictions, and follow up care with the MD post discharge with min assist from CIR staff. Outcome: Progressing

## 2019-08-13 NOTE — Progress Notes (Signed)
Reported to MD in team conference that pt was unable to have MRI done yesterday due to anxiety/agitation. Asked to have medication to sedate patient for MRI  today. Amanda Cockayne, LPN

## 2019-08-13 NOTE — Progress Notes (Signed)
Physical Therapy Session Note  Patient Details  Name: Chase Caldwell MRN: [id number] Date of Birth: 07/01/1980  Today's Date: 08/13/2019 PT Individual Time: 5284-1324 PT Individual Time Calculation (min): 26 min   Short Term Goals: Week 4:  PT Short Term Goal 1 (Week 4): STG=LTG due to ELOS.  Skilled Therapeutic Interventions/Progress Updates:    Pt received supine in bed with his wife present and pt agreeable to therapy session. Pt appears in good spirits and is agreeable for session focusing on ROM and positioning for tone management and pain management. Therapist noticed pt's L UE remains in shoulder internal rotation even when pt has muscle spasm causing his arm to raise overhead - pt's wife reports this happens frequently when he is lying in the bed - therapist educated pt/family that anticipate the positioning of shoulder internal rotation during this overhead movement is contributing to the shoulder pain. Therapist performed gentle L UE shoulder external rotation PROM and stretching to neutral and up to ~5degrees of external rotation as well as forearm supination ROM and stretching - pt reports discomfort with these movements requiring prolonged holds to allow muscle spasms to calm. Performed R LE ankle DF stretch with knee bent and extended targeting soleus and gastroc respectively - therapist utilized footboard of bed to assist with stretching as pt has significant resistance to movement (more resistance for gastroc than soleus - but may be due to changes in tone/muscle spasm with hip/knee extended versus flexed). Assisted with scooting towards HOB using bed features and +2 total assist via bed pads. Therapeutically positioned with pillows for pressure relief and UE support. Pt left supine in bed with HOB elevated and needs in reach with his wife present.  Therapy Documentation Precautions:  Precautions Precautions: Fall, Other (comment) Precaution Comments: PEG, significant hypertonia, right  ankle PF contracture Required Braces or Orthoses: Other Brace Other Brace: abdominal binder (to keep PEG safe) Restrictions Weight Bearing Restrictions: No  Pain:   Reports some discomfort with stretching - modified stretch for pain management.   Therapy/Group: Individual Therapy  Tawana Scale , PT, DPT, CSRS  08/13/2019, 5:44 PM

## 2019-08-13 NOTE — Patient Care Conference (Signed)
Inpatient RehabilitationTeam Conference and Plan of Care Update Date: 08/13/2019   Time: 10:12 AM     Patient Name: Chase Caldwell      Medical Record Number: [id number]  Date of Birth: 02-Jul-1980 Sex: Male         Room/Bed: 4W07C/4W07C-01 Payor Info: Payor: MEDICAID [place] / Plan: MEDICAID [place] ACCESS / Product Type: *No Product type* /    Admit Date/Time:  07/19/2019  3:13 PM  Primary Diagnosis:  TBI (traumatic brain injury) [facility])  Hospital Problems: Principal Problem:   TBI (traumatic brain injury) ([place]) Active Problems:   Transaminitis   Tachycardia   Elevated blood pressure reading   Muscle spasm   Chronic pain syndrome   Hypokalemia   Essential hypertension   Abnormal increased muscle tone   Shortness of breath   Acute lower UTI    Expected Discharge Date: Expected Discharge Date: 08/17/19  Team Members Present: Physician leading conference: Dr. Alger Simons Care Coodinator Present: Loralee Pacas, LCSWA;Markale Birdsell Creig Hines, RN, BSN, [place] Nurse Present: Toy Cookey, LPN PT Present: Apolinar Junes, PT OT Present: Laverle Hobby, OT SLP Present: Weston Anna, SLP PPS Coordinator present : Gunnar Fusi, SLP     Current Status/Progress Goal Weekly Team Focus  Bowel/Bladder   continent with incontinent episodes  continent x2  timed toilieting q 2-3 hrs   Swallow/Nutrition/ Hydration   Dys. 1 textures with thin liquids, supervision  Supervision  family education   ADL's   Fluctuates in tone, showering not safe for family, bed level ADLs. Max-total A +2 transfers  Total A ADLs, min-mod bed mobility goals  Tone management, family training/education, sitting balance, ADLs   Mobility   Fluctuating performance with functional mobility due to systemic tone, mod A of 1-max A of 2 for slide board transfers and bed mobility  Mod A bed mobility and transfers, family indipendent with hoyer lift transfers  Bed mobility, positioning, hoyer lift training with family, w/c  mobility, standing tolerance, tone and pain managment, activity tolerance, patient/caregiver education   Communication   Supervision-Min A  Supervision  family education   Safety/Cognition/ Behavioral Observations  Supervision  Supervision  family education   Pain   Pain with spasms to extremities  <4 on a 0-10 pain scale  assess pain and spasms q 4hrs and prn and treat appropriately   Skin   Incisions x2, trach wound  no new breakdown before discharge  assess skin q shift and prn     Team Discussion:  Discharge Planning/Teaching Needs:  24/7 care from wife and his parents who are here from [place] to assist with providing care.  Family education as recommended. Parents will be here all this week for family edu. Wife willing to come in for family edu when needed.   Current Update:  None  Current Barriers to Discharge:  Incontinence and pain, spasms, foot drop.  Possible Resolutions to Barriers: Continue timed toileting schedule, continue pain, and spasm medication regimen. Casting has shown some improvement, will try again before discharge.  Patient on target to meet rehab goals: yes, Total assist goals for ADL's, new pain to left wrist, patient seems to be able to identify some objects at a distance, Parents have gone through [employer], power wheel chair on-loan and therapy practicing to see if it will be useful. Wife to come in for more family education. SLP is improving and progressing towards goals.  *See Care Plan and progress notes for long and short-term goals.   Revisions to Treatment Plan:  MD may consider Botox for tone, Sedation will be needed for another MRI, MD will address pain to left wrist.    Medical Summary Current Status: ongoing tone in all 4 but seems improved. less extensor tone, unable to complete MRI d/t movement, needs sedation. Weekly Focus/Goal: tone control, neuro work up, bp mgt, pain mgt  Barriers to Discharge: Medical stability    Possible Resolutions to Barriers: ongoing tone mgt, complete neuro work up. bp control   Continued Need for Acute Rehabilitation Level of Care: The patient requires daily medical management by a physician with specialized training in physical medicine and rehabilitation for the following reasons: Direction of a multidisciplinary physical rehabilitation program to maximize functional independence : Yes Medical management of patient stability for increased activity during participation in an intensive rehabilitation regime.: Yes Analysis of laboratory values and/or radiology reports with any subsequent need for medication adjustment and/or medical intervention. : Yes   I attest that I was present, lead the team conference, and concur with the assessment and plan of the team.   Dorthula Nettles G 08/13/2019, 2:00 PM

## 2019-08-13 NOTE — Progress Notes (Signed)
Physical Therapy Session Note  Patient Details  Name: Chase Caldwell MRN: [id number] Date of Birth: 18-Apr-1980  Today's Date: 08/13/2019 PT Individual Time: 0900-1000 and 0277-4128 PT Individual Time Calculation (min): 60 min and 77 min   Short Term Goals: Week 4:  PT Short Term Goal 1 (Week 4): STG=LTG due to ELOS.  Skilled Therapeutic Interventions/Progress Updates:     Session 1: Patient in bed upon PT arrival. Patient alert and agreeable to PT session. Patient reported intermittent L wrist pain during session, RN made aware. PT provided repositioning, rest breaks, and distraction as pain interventions throughout session. Focused session on positioning and trial of power wheelchair. Patient transferred from bed>[name]>power w/c>[name] using a slide board with max-mod A of 2 with increased posterior push verses later push today. Provided cues and facilitation for trunk flexion, head-hips relationship, foot placement, and hand placement throughout.  Adjusted head mount and attempted to adjust hand control mount due to positioning too close to patient's trunk in sitting. Unable to adjust mount due to limited tools, [name] notified and coming to adjust this afternoon. Patient trials propulsion with standard joystick and with a yellow ball attached to the joystick for improved visual feedback and use of gross grasp with R hand. Patient steered forward with hand over hand assist for hand control, without hand control only able to turn L. Placed patient's hand out further and patient was able to volitionally move his hand forward/backwards and side to side with decreased motor control. Patient able to identify a doorway, bathroom, and when PT walked into field of view today. Will continue to problem solve power mobility positioning and hand control during next session this afternoon. Patient in [name] in the room with his parents at end of session with breaks locked, seat belt fastened, and all needs within  reach.   Session 2: Patient in bed with parents and NT providing peri-care following BM upon PT arrival. Patient alert and agreeable to PT session. Patient reported intermittent positional L wrist and shoulder pain during session, RN made aware. PT provided repositioning, rest breaks, and distraction as pain interventions throughout session. Focused session on hands on training with patient's parents for use of hoyer lift and adjustment/assessment of power chair mobility with Chase Caldwell, [name] from [employer].   Therapeutic Activity: Bed Mobility: Patient performed rolling R/L with mod-max A for hoyer sling placement and required mod-total A for repositioning in the bed following hoyer lift transfer. Provided verbal cues for performing bridging with feet blocked and facilitation at his hips from a second person, followed by lateral shift of his trunk for scooting over in the bed due to poor placement after transferring back to bed. Transfers: Patient performed hoyer lift transfers with total A of 3. [name] parents required mod cues and demonstration for hoyer sling placement and assist for placement of patient's L UE (outside of sling for comfort due to tone) when transferring bed>power chair. Required increased assist upon return to bed due to challenges with the [name] elevation for lift clearance under the bed and limited lift height. Required +3 assist to lift patient onto the bed in the sling. His parents performed sling removal and placement with mod cues for safe positioning. Plan to continue hands on training with hospital bed following tomorrow's casting session.   Wheelchair Mobility:  Adjusted power chair hand control mount for improved reach and reduced UE flexion with hand controls. Patient with increased difficulty locating joystick, even with bright color added with  [name]. Also adjusted head support for improved positioning. Performed UE NMR, see below, and discussed the benefits of d/c  with a loaner [name] at this time with goals for improved target finding and UE motor control prior to progressing to power mobility. TIS will provide patient with improved sitting tolerance, family assisted pressure relief, and improved positioning due to full back and head support due to systemic tone affecting sitting positioning and postural control. Patient and family in agreement.   Neuromuscular Re-ed: Patient performed the following R UE motor control activities: -reaching forward to target with <50 % accuracy -grasping foam tubing and joystick, required hand over hand assist -moving hand forward/backward and R/L holding joystick or tubing -visually locating objects, unable to localize objects that were closer, able to identify 2/5 objects >10 ft away  Patient in bed with his parents at bedside at end of session with breaks locked and all needs within reach.    Therapy Documentation Precautions:  Precautions Precautions: Fall, Other (comment) Precaution Comments: PEG, significant hypertonia, right ankle PF contracture Required Braces or Orthoses: Other Brace Other Brace: abdominal binder (to keep PEG safe) Restrictions Weight Bearing Restrictions: No    Therapy/Group: Individual Therapy  Early Ord L Aretta Stetzel PT, DPT  08/13/2019, 4:59 PM

## 2019-08-13 NOTE — Progress Notes (Signed)
Occupational Therapy Session Note  Patient Details  Name: Chase Caldwell MRN: [id number] Date of Birth: [birth year]  Today's Date: 08/13/2019 OT Individual Time: 1100-1157 OT Individual Time Calculation (min): 57 min    Short Term Goals: Week 4:  OT Short Term Goal 1 (Week 4): STG=LTG d/t ELOS  Skilled Therapeutic Interventions/Progress Updates:    Pt was received in the [name] with no c/o pain, agreeable to session. Pt's parents present for entire session and actively helping. Session focused on use of standing in the standing frame for tone reduction and improving upright posture/LE stabilization. Pt was able to tolerate 3x 2-4 minute trials with MAX facilitation required at the the R ankle to promote increased plantarflexion and reduce inversion. Pt with no c/o pain throughout, just fatigue as trials progressed. Seated rest break provided between each trial. Pt then returned to the [name] and completed propulsion with his BLE backward with mod cueing. This clinician was unable to provide enough support dynamically to the R ankle to promote proper foot strike so it was placed on the footrest. He was able to continue another 50 ft with his LLE only- mod A overall. Pt completed slideboard transfer back to the w/c with max A, +2 assist for safety. Pt left in sidelying with all needs met, parents present.   Therapy Documentation Precautions:  Precautions Precautions: Fall, Other (comment) Precaution Comments: PEG, significant hypertonia, right ankle PF contracture Required Braces or Orthoses: Other Brace Other Brace: abdominal binder (to keep PEG safe) Restrictions Weight Bearing Restrictions: No   Therapy/Group: Individual Therapy  [name] 08/13/2019, 6:20 AM

## 2019-08-13 NOTE — Progress Notes (Signed)
On call provider notified that MRI was not completed d/t patient being unable to lay flat, see previous note from radiology tech.

## 2019-08-13 NOTE — Progress Notes (Signed)
[place] PHYSICAL MEDICINE & REHABILITATION PROGRESS NOTE  Subjective/Complaints: Had a pretty good night. Spasms under fair control. Reporting less pain. Nurse says he had a good evening  ROS: Limited due to cognitive/behavioral    Objective: Vital Signs: Blood pressure 121/78, pulse 81, temperature 98.2 F (36.8 C), temperature source Oral, resp. rate 18, height 6' (1.829 m), weight (!) 118.6 kg, SpO2 98 %. No results found. Recent Labs    08/12/19 0518  WBC 5.4  HGB 12.9*  HCT 41.3  PLT 299   Recent Labs    08/12/19 0518  NA 137  K 4.4  CL 104  CO2 22  GLUCOSE 80  BUN 12  CREATININE 0.69  CALCIUM 9.0    Physical Exam: BP 121/78   Pulse 81   Temp 98.2 F (36.8 C) (Oral)   Resp 18   Ht 6' (1.829 m)   Wt (!) 118.6 kg   SpO2 98%   BMI 35.46 kg/m  Constitutional: No distress . Vital signs reviewed. HEENT: EOMI, oral membranes moist Neck: supple Cardiovascular: RRR without murmur. No JVD    Respiratory/Chest: CTA Bilaterally without wheezes or rales. Normal effort    GI/Abdomen: BS +, non-tender, non-distended Ext: no clubbing, cyanosis, or edema Psych: pleasant and cooperative Skin: PEG site clean   Neuro: alert. Phonation/speech better Facial paralysis, stable Vision remains limited--more shapes? Significant generalized extensor tone less dramatic. Right heel cord still quite tight  Motor:  moving all extremities  Assessment/Plan: 1. 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  Physiatrist is providing close team supervision and 24 hour management of active medical problems listed below.  Physiatrist and rehab team continue to assess barriers to discharge/monitor patient progress toward functional and medical goals  Care Tool:  Bathing        Body parts bathed by helper: Right arm, Left arm, Chest, Abdomen,  Front perineal area, Buttocks, Right upper leg, Left upper leg, Right lower leg, Left lower leg, Face     Bathing assist Assist Level: 2 Helpers     Upper Body Dressing/Undressing Upper body dressing   What is the patient wearing?: Pull over shirt    Upper body assist Assist Level: Total Assistance - Patient < 25%    Lower Body Dressing/Undressing Lower body dressing      What is the patient wearing?: Pants     Lower body assist Assist for lower body dressing: 2 Helpers     Toileting Toileting Toileting Activity did not occur Landscape architect and hygiene only): Refused  Toileting assist Assist for toileting: 2 Helpers     Transfers Chair/bed transfer  Transfers assist  Chair/bed transfer activity did not occur: Safety/medical concerns (safety/medical b/c we introduced new technique??)  Chair/bed transfer assist level: 2 Helpers Chair/bed transfer assistive device: Sliding board   Locomotion Ambulation   Ambulation assist   Ambulation activity did not occur: Safety/medical concerns          Walk 10 feet activity   Assist  Walk 10 feet activity did not occur: Safety/medical concerns        Walk 50 feet activity   Assist Walk 50 feet with 2 turns activity did not occur: Safety/medical concerns         Walk 150 feet activity   Assist Walk 150 feet activity did not occur: Safety/medical concerns         Walk 10  feet on uneven surface  activity   Assist Walk 10 feet on uneven surfaces activity did not occur: Safety/medical concerns         Wheelchair     Assist Will patient use wheelchair at discharge?:  (likely will require dependent w/c transport)      Wheelchair assist level: Maximal Assistance - Patient 25 - 49% Max wheelchair distance: 96ft    Wheelchair 50 feet with 2 turns activity    Assist        Assist Level: Total Assistance - Patient < 25% (total assist for turning, max assist for controll  w/retropulsion)   Wheelchair 150 feet activity     Assist     Assist Level: Dependent - Patient 0%    Medical Problem List and Plan: 1. Diffuse weakness with hypertonicity and deficits with self-care, swallowing, cognition secondary to TBI with significant Psych history.   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   Per neurology, repeat MRI ordered for 8/2 but he couldn't tolerate. Will re-order with general anaesthesia as oral sedation will not be adequate based on prior experiences with him.  -folate within normal limits, vitamin B12-low normal, magnesium nl  B12 supplementation initiated    2.  Antithrombotics: -DVT/anticoagulation:  Pharmaceutical: Lovenox             -antiplatelet therapy: N/a 3. Chronic LBP/Pain Management:                8/3: Well controlled with Tylenol and Neurontin scheduled, and Norco PRN.  4. Mood/agitation/hx of bipolar disorder: Neuropsych and psychiatry to follow during his stay. LCSW to follow for evaluation and support             -antipsychotic agents:  seroquel  -continue cymbalta, zoloft  -added gabapentin as above  -ativan BID discontinued.  -psych also following this patient although hasn't seen for some time  See # 14 5. Neuropsych: This patient is not fully capable of making decisions on his own behalf. 6. Skin/Wound Care: Routine pressure relief measures.  7. Fluids/Electrolytes/Nutrition:  11. Facial Fx/Maxillary fracture s/p repair with associated dysphagia: Wires removed by ENT             Dysphagia 1 thin diet--advance per SLP. PEG removed 6/38 without complications  12. ABLA:   8/2: Hgb is 12.9  Continue to monitor 13. Prediabetes: Hyperglycemia due to tube feeds. Continue feeds at nights as intake remains variable.    Dc cbg checks/SSI  14.  Severe muscle spasms with extensor tone: better controlled with Baclofen, serial casting, ROM with therapies, Klonopin.   8/3-unable to  do botox today    -proceed with casting tomorrow if possible 15.  Tachycardia:  Continue propanolol which was increased to 40 3 times daily on 7/11  See #14, 18  HR controlled  16.  Transaminitis  ALT elevated on 7/23. Repeat in outpatient labs.  17.  Hypokalemia  Now stable off supplementation  18.  Hypertension: remains stable with Lisinopril and Propanolol.  19.  Acute lower UTI with Klebsiella and Pseudomonas: treatment completed   LOS: 25 days A FACE TO [place] 08/13/2019, 12:16 PM

## 2019-08-13 NOTE — Progress Notes (Signed)
Patient ID: Chase Caldwell, male   DOB: Oct 17, 1980, 39 y.o.   MRN: [id number]   Diagnosis code: S06.9X9A  Height: 6'0"  Weight: 261 lbs   Patient has pain and max level of care for transfers which requires patient to be positioned in ways not feasible with a normal bed.  Head must be elevated atleast 45 degress to address issues related to pain.    To prevent skin break down, pt requires frequent and immediate changes in body position which cannot be achieved with a normal bed.

## 2019-08-13 NOTE — Progress Notes (Signed)
Called RN to ask if patient is willing to come to MRI. Family is refusing MRI without anesthesia for scan. Radiologist to be consulted.

## 2019-08-14 ENCOUNTER — Inpatient Hospital Stay (HOSPITAL_COMMUNITY): Payer: Medicaid Other | Admitting: Certified Registered Nurse Anesthetist

## 2019-08-14 ENCOUNTER — Inpatient Hospital Stay (HOSPITAL_COMMUNITY): Payer: Medicaid Other

## 2019-08-14 ENCOUNTER — Inpatient Hospital Stay (HOSPITAL_COMMUNITY): Payer: Medicaid Other | Admitting: Speech Pathology

## 2019-08-14 ENCOUNTER — Encounter (HOSPITAL_COMMUNITY): Payer: Self-pay | Admitting: Physical Medicine & Rehabilitation

## 2019-08-14 ENCOUNTER — Encounter (HOSPITAL_COMMUNITY)
Admission: RE | Disposition: A | Discharge status: Home Health | Payer: Self-pay | Source: Intra-hospital | Attending: Physical Medicine & Rehabilitation

## 2019-08-14 HISTORY — PX: RADIOLOGY WITH ANESTHESIA: SHX6223

## 2019-08-14 SURGERY — MRI WITH ANESTHESIA
Anesthesia: General

## 2019-08-14 MED ORDER — ORAL CARE MOUTH RINSE: 15.0000 mL | Freq: Once | OROMUCOSAL | Status: AC

## 2019-08-14 MED ORDER — FENTANYL CITRATE (PF) 250 MCG/5ML IJ SOLN
INTRAMUSCULAR | Status: AC
  Filled 2019-08-14: qty 5

## 2019-08-14 MED ORDER — GADOBUTROL 1 MMOL/ML IV SOLN
10.0000 mL | Freq: Once | INTRAVENOUS | Status: AC | PRN
  Administered 2019-08-14: 10 mL via INTRAVENOUS

## 2019-08-14 MED ORDER — ENSURE ENLIVE PO LIQD
237.0000 mL | ORAL | Status: DC
  Administered 2019-08-14 – 2019-08-16 (×3): 237 mL via ORAL

## 2019-08-14 MED ORDER — CHLORHEXIDINE GLUCONATE 0.12 % MT SOLN
15.0000 mL | Freq: Once | OROMUCOSAL | Status: AC
  Administered 2019-08-14: 15 mL via OROMUCOSAL

## 2019-08-14 MED ORDER — MIDAZOLAM HCL 2 MG/2ML IJ SOLN
INTRAMUSCULAR | Status: AC
  Filled 2019-08-14: qty 2

## 2019-08-14 MED ORDER — LACTATED RINGERS IV SOLN: INTRAVENOUS | Status: DC

## 2019-08-14 MED ORDER — ROCURONIUM BROMIDE 10 MG/ML (PF) SYRINGE
PREFILLED_SYRINGE | INTRAVENOUS | Status: DC | PRN
  Administered 2019-08-14: 50 mg via INTRAVENOUS

## 2019-08-14 MED ORDER — CHLORHEXIDINE GLUCONATE 0.12 % MT SOLN
15.0000 mL | OROMUCOSAL | Status: AC
  Filled 2019-08-14 (×2): qty 15

## 2019-08-14 MED ORDER — LACTATED RINGERS IV SOLN
INTRAVENOUS | Status: DC | PRN
Start: 2019-08-14 — End: 2019-08-14

## 2019-08-14 MED ORDER — GLYCOPYRROLATE PF 0.2 MG/ML IJ SOSY
PREFILLED_SYRINGE | INTRAMUSCULAR | Status: DC | PRN
Start: 2019-08-14 — End: 2019-08-14
  Administered 2019-08-14: .2 mg via INTRAVENOUS

## 2019-08-14 MED ORDER — SUGAMMADEX SODIUM 200 MG/2ML IV SOLN
INTRAVENOUS | Status: DC | PRN
  Administered 2019-08-14: 200 mg via INTRAVENOUS

## 2019-08-14 MED ORDER — DEXAMETHASONE SODIUM PHOSPHATE 10 MG/ML IJ SOLN
INTRAMUSCULAR | Status: DC | PRN
  Administered 2019-08-14: 5 mg via INTRAVENOUS

## 2019-08-14 MED ORDER — PROPOFOL 10 MG/ML IV BOLUS
INTRAVENOUS | Status: DC | PRN
  Administered 2019-08-14: 200 mg via INTRAVENOUS

## 2019-08-14 MED ORDER — ONDANSETRON HCL 4 MG/2ML IJ SOLN: 4.0000 mg | Freq: Once | INTRAMUSCULAR | Status: DC | PRN

## 2019-08-14 MED ORDER — ONDANSETRON HCL 4 MG/2ML IJ SOLN
INTRAMUSCULAR | Status: DC | PRN
  Administered 2019-08-14: 4 mg via INTRAVENOUS

## 2019-08-14 MED ORDER — LIDOCAINE 2% (20 MG/ML) 5 ML SYRINGE
INTRAMUSCULAR | Status: DC | PRN
  Administered 2019-08-14: 100 mg via INTRAVENOUS

## 2019-08-14 MED ORDER — FENTANYL CITRATE (PF) 100 MCG/2ML IJ SOLN: 25.0000 ug | INTRAMUSCULAR | Status: DC | PRN

## 2019-08-14 NOTE — Progress Notes (Signed)
Physical Therapy Note  Patient Details  Name: Chase Caldwell MRN: [id number] Date of Birth: 24-Jul-1980 Today's Date: 08/14/2019    Returned for afternoon PT session. Patient off-unit for MRI with anesthesia. Patient missed 30 min of skilled PT due to off-unit procedure, RN made aware. Will attempt to make-up missed time as able.     Senetra Dillin L Nikitta Sobiech PT, DPT  08/14/2019, 3:02 PM

## 2019-08-14 NOTE — Progress Notes (Signed)
Orthopedic Tech Progress Note Patient Details:  Chase Caldwell Jun 02, 1980 [id number]  Casting Type of Cast: Short leg cast Cast Location: RLE Cast Material: Fiberglass Cast Intervention: Application  Post Interventions Patient Tolerated: Other (comment) Instructions Provided: Other (comment)    Ortho Devices Type of Ortho Device: Cotton web roll, Stockinette Ortho Device/Splint Location: RLE Ortho Device/Splint Interventions: Ordered   Post Interventions Patient Tolerated: Other (comment) Instructions Provided: Other (comment)   Janit Pagan 08/14/2019, 9:48 AM

## 2019-08-14 NOTE — Progress Notes (Signed)
Occupational Therapy Session Note  Patient Details  Name: Chase Caldwell MRN: [id number] Date of Birth: [birth year]  Today's Date: 08/14/2019 OT Individual Time: 1330-1430 OT Individual Time Calculation (min): 60 min    Short Term Goals: Week 4:  OT Short Term Goal 1 (Week 4): STG=LTG d/t ELOS  Skilled Therapeutic Interventions/Progress Updates:    Pt received supine with wife present. Pt agreeable to OT with no c/o pain. Reviewed POC re L wrist tone with OT Wendi. 2/2 pt's tone and pronation, unable to tolerate hard custom splint, will continue soft splint. Pt completed bed mobility to EOB with mod A, good trunk lifting from R sidelying. Pt completed slideboard transfer to the w/c with +2 required for equipment stabilization and max A overall. Pt completed oral care at the sink with max A. Trialed built up handle and pt able to complete 10% of oral care with max stabilization at the elbow. Attempted to complete standing frame but equipment was being used. RN approached during session and reported MRI would be occurring soon. Returned pt to room and he completed slideboard transfer back to bed with max +2 assist. From EOB pt completed trunk stabilization activities with max cueing, mod facilitation overall. Focused on protective trunk extension to prevent forward LOB. Also completed lateral leans to elbow for oblique and TVA activation/stabilization. R>L strength. Pt returned to supine and scooted himself up in bed with min A. Pt left supine with pillows for comfort/tone reduction. Wife present. 15 min missed 2/2 MRI.   Therapy Documentation Precautions:  Precautions Precautions: Fall, Other (comment) Precaution Comments: PEG, significant hypertonia, right ankle PF contracture Required Braces or Orthoses: Other Brace Other Brace: abdominal binder (to keep PEG safe) Restrictions Weight Bearing Restrictions: No   Therapy/Group: Individual Therapy  [name] 08/14/2019, 6:47 AM

## 2019-08-14 NOTE — Progress Notes (Signed)
Speech Language Pathology Daily Session Note  Patient Details  Name: Leng Montesdeoca MRN: [id number] Date of Birth: 1980/02/06  Today's Date: 08/14/2019 SLP Individual Time: 0725-0755 SLP Individual Time Calculation (min): 30 min  Short Term Goals: Week 4: SLP Short Term Goal 1 (Week 4): STG=LTG due to remaining LOS  Skilled Therapeutic Interventions: Skilled treatment session focused on dysphagia goals. SLP facilitated session by providing skilled observation with breakfast meal of Dys.1 textures with thin liquids. Patient consumed meal without overt s/s of aspiration and required and was overall Mod I for use of swallowing compensatory strategies. [name] mother present and educated in regards to swallowing function, appropriate textures and swallowing compensatory strategies. She verbalized understanding of all information. A handout was also given to reinforce information. Patient left upright in bed with mother present. Continue with current plan of care.      Pain Pain Assessment Pain Score: 0-No pain  Therapy/Group: Individual Therapy  Ralph Benavidez 08/14/2019, 8:10 AM
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
## 2019-08-14 NOTE — Transfer of Care (Signed)
Immediate Anesthesia Transfer of Care Note  Patient: Chase Caldwell  Procedure(s) Performed: MRI WITH ANESTHESIA -BRAIN (N/A )  Patient Location: PACU  Anesthesia Type:General  Level of Consciousness: awake and alert   Airway & Oxygen Therapy: Patient Spontanous Breathing  Post-op Assessment: Report given to RN and Post -op Vital signs reviewed and stable  Post vital signs: Reviewed and stable  Last Vitals:  Vitals Value Taken Time  BP 137/88 08/14/19 1731  Temp 36.6 C 08/14/19 1731  Pulse 94 08/14/19 1735  Resp 20 08/14/19 1735  SpO2 95 % 08/14/19 1735  Vitals shown include unvalidated device data.  Last Pain:  Vitals:   08/14/19 0500  TempSrc: Oral  PainSc: 0-No pain      Patients Stated Pain Goal: 0 (93/23/55 7322)  Complications: No complications documented.

## 2019-08-14 NOTE — Progress Notes (Signed)
Physical Therapy Session Note  Patient Details  Name: Chase Caldwell MRN: [id number] Date of Birth: 05/24/1980  Today's Date: 08/14/2019 PT Co-Treatment Time: 1000-1105 PT Co-Treatment Time Calculation: 75 min  Short Term Goals: Week 4:  PT Short Term Goal 1 (Week 4): STG=LTG due to ELOS.  Skilled Therapeutic Interventions/Progress Updates:     Patient in bed with his parents and RN in the room upon PT arrival. Patient alert and agreeable to PT session focused on casting R LE for reduced PF tone, co-treatment with Ellard Artis, OT for casting. RN provided morning medications at beginning of session. PT and OT then repositioned patient into prone position with multiple pillows built up under his chest and hips for comfort. Required max A +2 for scooting and rolling L to prone and total A +2 for positioning in prone due to systemic tone and spasticity of L UE. Repositioned L hand splint for increased comfort during session.   OT provided R DF stretch with knee flexed as PT applied felt padding with circumferential wrap from distal end of great toe to tibial tuberosity,foam padding over dorsum of the foot and tibial shaft, and gel padding to lateral aspect of the foot and B malleoli. Patient then crying out in pain and unable to tolerate prone positioning due to L shoulder pain with spasticity. Patient stated that he could not tolerate lying prone and was assisted back to supine with max-total A +2.   Completed padding and OT initiated application of fiberglass casting with 2 person assist to position patient with R knee/hip flexed and DF/eversion stretch. Patient with increased LE tone pushing into DF and inversion during casting. Unable to successfully apply casting safely and effectively with this positioning. Opted to remove casting and padding materials for patient safety. Will attempt to reapply cast tomorrow afternoon following therapies, MD made aware.  Patient in bed with his parents at bedside at  end of session with breaks locked and all needs within reach.   Therapy Documentation Precautions:  Precautions Precautions: Fall, Other (comment) Precaution Comments: PEG, significant hypertonia, right ankle PF contracture Required Braces or Orthoses: Other Brace Other Brace: abdominal binder (to keep PEG safe) Restrictions Weight Bearing Restrictions: No    Therapy/Group: Co-Treatment  Zebadiah Willert L Ciena Sampley PT, DPT  08/14/2019, 5:03 PM

## 2019-08-14 NOTE — Progress Notes (Addendum)
[facility] PHYSICAL MEDICINE & REHABILITATION PROGRESS NOTE  Subjective/Complaints: Slept well again last night. Pain better controlled. Mother at bedside  ROS: limited by lethargy   Objective: Vital Signs: Blood pressure 128/74, pulse 70, temperature 98.6 F (37 C), temperature source Oral, resp. rate 18, height 6' (1.829 m), weight (!) 118.6 kg, SpO2 98 %. No results found. Recent Labs    08/12/19 0518  WBC 5.4  HGB 12.9*  HCT 41.3  PLT 299   Recent Labs    08/12/19 0518  NA 137  K 4.4  CL 104  CO2 22  GLUCOSE 80  BUN 12  CREATININE 0.69  CALCIUM 9.0    Physical Exam: BP 128/74 (BP Location: Left Arm)   Pulse 70   Temp 98.6 F (37 C) (Oral)   Resp 18   Ht 6' (1.829 m)   Wt (!) 118.6 kg   SpO2 98%   BMI 35.46 kg/m  Constitutional: No distress . Vital signs reviewed. HEENT: EOMI, oral membranes moist Neck: supple Cardiovascular: RRR without murmur. No JVD    Respiratory/Chest: CTA Bilaterally without wheezes or rales. Normal effort    GI/Abdomen: BS +, non-tender, non-distended Ext: no clubbing, cyanosis, or edema Psych: pleasant and cooperative Skin: former PEG site clean, closed   Neuro: slow to arouse Facial paralysis, stable Vision remains limited--more shapes? Significant generalized extensor tone less dramatic. Right heel cord still quite tight  Motor:  moving all extremities  Assessment/Plan: 1. 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  Physiatrist is providing close team supervision and 24 hour management of active medical problems listed below.  Physiatrist and rehab team continue to assess barriers to discharge/monitor patient progress toward functional and medical goals  Care Tool:  Bathing        Body parts bathed by helper: Right arm, Left arm, Chest, Abdomen, Front perineal area, Buttocks, Right  upper leg, Left upper leg, Right lower leg, Left lower leg, Face     Bathing assist Assist Level: 2 Helpers     Upper Body Dressing/Undressing Upper body dressing   What is the patient wearing?: Pull over shirt    Upper body assist Assist Level: Total Assistance - Patient < 25%    Lower Body Dressing/Undressing Lower body dressing      What is the patient wearing?: Pants     Lower body assist Assist for lower body dressing: 2 Helpers     Toileting Toileting Toileting Activity did not occur Landscape architect and hygiene only): Refused  Toileting assist Assist for toileting: 2 Helpers     Transfers Chair/bed transfer  Transfers assist  Chair/bed transfer activity did not occur: Safety/medical concerns (safety/medical b/c we introduced new technique??)  Chair/bed transfer assist level: 2 Helpers Chair/bed transfer assistive device: Sliding board   Locomotion Ambulation   Ambulation assist   Ambulation activity did not occur: Safety/medical concerns          Walk 10 feet activity   Assist  Walk 10 feet activity did not occur: Safety/medical concerns        Walk 50 feet activity   Assist Walk 50 feet with 2 turns activity did not occur: Safety/medical concerns         Walk 150 feet activity   Assist Walk 150 feet activity did not occur: Safety/medical concerns         Walk 10 feet on uneven surface  activity   Assist Walk 10 feet on uneven surfaces activity did not occur: Safety/medical concerns         Wheelchair     Assist Will patient use wheelchair at discharge?:  (likely will require dependent w/c transport)      Wheelchair assist level: Maximal Assistance - Patient 25 - 49% Max wheelchair distance: 73ft    Wheelchair 50 feet with 2 turns activity    Assist        Assist Level: Total Assistance - Patient < 25% (total assist for turning, max assist for controll w/retropulsion)   Wheelchair 150 feet  activity     Assist     Assist Level: Dependent - Patient 0%    Medical Problem List and Plan: 1. Diffuse weakness with hypertonicity and deficits with self-care, swallowing, cognition secondary to TBI with significant Psych history.   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   -f/u MRI under anaesthesia is pending.  -folate within normal limits, vitamin B12-low normal, magnesium nl   B12 supplementation initiated   -Mobility Assessment  Chase Caldwell was seen today for the purpose of a mobility assessment for a tilt in space wheelchair.   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 Due to his spastic tetraplegia, he is unable to utilize a cane, walker, standard wheelchair, or scooter.  The chair will also allow for increased mobility and for easier completion of ADL's .    Additionally, [name] requires a semi-electric hospital bed (with gel overlay to prevent skin breakdown) and hoyer lift to allow his family to provide care, transfers at home. The patient can operate the bed controls independently to make needed adjustments in position.    2.  Antithrombotics: -DVT/anticoagulation:  Pharmaceutical: Lovenox             -antiplatelet therapy: N/a 3. Chronic LBP/Pain Management:                8/3: Well controlled with Tylenol and Neurontin scheduled, and Norco PRN.  4. Mood/agitation/hx of bipolar disorder: Neuropsych and psychiatry to follow during his stay. LCSW to follow for evaluation and support             -antipsychotic agents:  seroquel  -continue cymbalta, zoloft  -added gabapentin as above  -ativan BID discontinued.  -psych also following this patient although hasn't seen for some time  See # 14 5. Neuropsych: This patient is not fully capable of making decisions on his own behalf. 6. Skin/Wound Care: Routine pressure relief measures.  7. Fluids/Electrolytes/Nutrition:  11. Facial  Fx/Maxillary fracture s/p repair with associated dysphagia: Wires removed by ENT             Dysphagia 1 thin diet--advance per SLP. PEG removed 1/51 without complications  12. ABLA:   8/2: Hgb is 12.9  Continue to monitor 13. Prediabetes: Hyperglycemia due to tube feeds. Continue feeds at nights as intake remains variable.    Dc cbg checks/SSI  14.  Severe muscle spasms with extensor tone: better controlled with Baclofen, serial casting, ROM with therapies, Klonopin.   8/4 serial casting today   -botox as outpatient 15.  Tachycardia:  Continue propanolol which was increased to 40 3 times daily on 7/11  See #14, 18  HR controlled at present 16.  Transaminitis  ALT elevated on 7/23. Repeat in outpatient labs.  17.  Hypokalemia  Now stable off supplementation  18.  Hypertension: remains  stable with Lisinopril and Propanolol.  19.  Acute lower UTI with Klebsiella and Pseudomonas: treatment completed   LOS: 26 days A FACE TO FACE EVALUATION WAS PERFORMED  Meredith Staggers 08/14/2019, 8:13 AM

## 2019-08-14 NOTE — Anesthesia Procedure Notes (Signed)
Procedure Name: Intubation Date/Time: 08/14/2019 4:31 PM Performed by: Alain Marion, CRNA Pre-anesthesia Checklist: Patient identified, Emergency Drugs available, Suction available and Patient being monitored Patient Re-evaluated:Patient Re-evaluated prior to induction Oxygen Delivery Method: Circle System Utilized Preoxygenation: Pre-oxygenation with 100% oxygen Induction Type: IV induction Ventilation: Mask ventilation without difficulty and Oral airway inserted - appropriate to patient size Laryngoscope Size: Glidescope and 4 Grade View: Grade I Tube type: Oral Tube size: 7.5 mm Number of attempts: 1 Airway Equipment and Method: Stylet and Oral airway Placement Confirmation: ETT inserted through vocal cords under direct vision,  positive ETCO2 and breath sounds checked- equal and bilateral Secured at: 22 cm Tube secured with: Tape Dental Injury: Teeth and Oropharynx as per pre-operative assessment  Comments: Performed by Terence Lux, SRNA under supervision of CRNA and MD

## 2019-08-14 NOTE — Progress Notes (Addendum)
Patient ID: Chase Caldwell, male   DOB: Oct 27, 1980, 39 y.o.   MRN: [id number]  SW faxed DME order: hospital bed, hoyer lift with extra large sling, DABSC, and appropriate documentation to [facility] (p:[phone]/f:[phone]).   SW spoke with Karen/[facility] to remind on pt d/c date 8/7.  SW faxed PCS referral to [facility] [phone]).  Loralee Pacas, MSW, [place] Office: [phone] Cell: [phone] Fax: [phone]

## 2019-08-15 ENCOUNTER — Encounter (HOSPITAL_COMMUNITY): Payer: Self-pay | Admitting: Radiology

## 2019-08-15 ENCOUNTER — Inpatient Hospital Stay (HOSPITAL_COMMUNITY): Payer: Medicaid Other

## 2019-08-15 ENCOUNTER — Ambulatory Visit (HOSPITAL_COMMUNITY): Payer: Medicaid Other

## 2019-08-15 ENCOUNTER — Inpatient Hospital Stay (HOSPITAL_COMMUNITY): Payer: Medicaid Other | Admitting: Speech Pathology

## 2019-08-15 MED ORDER — DIAZEPAM 5 MG PO TABS
5.0000 mg | ORAL_TABLET | Freq: Once | ORAL | Status: AC
  Administered 2019-08-15: 5 mg via ORAL
  Filled 2019-08-15: qty 1

## 2019-08-15 NOTE — Progress Notes (Signed)
Speech Language Pathology Discharge Summary  Patient Details  Name: Chase Caldwell MRN: [id number] Date of Birth: 1980/07/03  Today's Date: 08/15/2019 SLP Individual Time: 1300-1330 SLP Individual Time Calculation (min): 30 min   Skilled Therapeutic Interventions:  Skilled treatment session focused on speech and dysphagia goals. SLP facilitated session by providing extra time for patient to recall speech intelligibility strategies. Patient independently recalled the strategies and required supervision verbal cues for utilization at the phrase level to achieve ~90% intelligibility. Patient also consumed his lunch meal of Dys. 1 textures with thin liquids with Mod I for use of swallowing compensatory strategies. [name] family present and all questions were answered and education completed regarding appropriate textures, swallowing and speech compensatory strategies. Patient left upright in bed with family present.   Patient has met 6 of 6 long term goals.  Patient to discharge at overall Supervision level.   Reasons goals not met: N/A   Clinical Impression/Discharge Summary: Patient has made functional gains and has met 6 of 6 LTGs this admission. Currently, patient is consuming Dys. 1 textures with thin liquids with minimal overt s/s of aspiration and is overall Mod I for use of swallowing compensatory strategies. Patient must remain on this diet for ~2 more weeks until he is cleared by the physician to begin mastication. [name] overall cognitive functioning appears [facility] for basic tasks with verbal problem solving appearing intact. Patient also demonstrates improved speech intelligibility and can express his wants/needs at the phrase and sentence level with ~90-100% intelligibility with supervision verbal cues needed for use of speech intelligibility strategies. Patient and family education is complete and patient will discharge home with 24 hour supervision from family. Patient would benefit from  f/u SLP services to maximize his swallowing and speech function.   Care Partner:  Caregiver Able to Provide Assistance: Yes  Type of Caregiver Assistance: Physical;Cognitive  Recommendation:  Home Health SLP;24 hour supervision/assistance  Rationale for SLP Follow Up: Maximize swallowing safety;Reduce caregiver burden;Maximize functional communication   Equipment: N/A   Reasons for discharge: Treatment goals met   Patient/Family Agrees with Progress Made and Goals Achieved: Yes    Eola Waldrep 08/15/2019, 10:55 AM

## 2019-08-15 NOTE — Progress Notes (Signed)
Patient ID: Chase Caldwell, male   DOB: 12-19-1980, 39 y.o.   MRN: [id number]   Followed up with patient spouse to see if there are any questions or concerns before discharge. Spouse reports she feels informed and is good to go. No further questions or concerns

## 2019-08-15 NOTE — Progress Notes (Signed)
Physical Therapy Discharge Summary  Patient Details  Name: Chase Caldwell MRN: [id number] Date of Birth: [birth year]   Patient has met 3 of 5 long term goals due to improved activity tolerance, improved balance, improved postural control, increased strength, increased range of motion, decreased pain, ability to compensate for deficits, improved attention, improved awareness and improved coordination.  Patient to discharge at a wheelchair level Total Assist.   [name] care partners are independent to provide the necessary physical assistance at discharge.  Reasons goals not met: Patient with fluctuations in mobility level from max +2 to mod A for bed mobility and slide board transfers due to systemic tone. [name] family is to use a hoyer lift for transfers at home and has demonstrated capability to assist patient bed level and with transfers independently.   Recommendation:  Patient will benefit from ongoing skilled PT services in home health setting to continue to advance safe functional mobility, address ongoing impairments in tone, functional mobility, w/c mobility, motor control, vision, cognition, patient/caregiver education, and minimize fall risk.  Equipment: hospital bed, hoyer lift with XL sling, loner [name]   Reasons for discharge: treatment goals met  Patient/family agrees with progress made and goals achieved: Yes  PT Discharge Precautions/Restrictions Precautions Precautions: Fall Precaution Comments: systemic increased tone with L UE and R LE most affected Restrictions Weight Bearing Restrictions: No (watch R foot in standing due to equanovarus position secondary to tone) Vision/Perception  Vision - Assessment Eye Alignment: Impaired (comment) Ocular Range of Motion: Other (comment) (able to track L<>R however requires cervical rotation to perform) Alignment/Gaze Preference: Head turned (head turned R) Tracking/Visual Pursuits: Decreased smoothness of horizontal  tracking;Decreased smoothness of vertical tracking;Requires cues, head turns, or add eye shifts to track Perception Perception: Impaired Spatial Orientation: impaired Praxis Praxis: Impaired Praxis Impairment Details: Motor planning  Cognition Overall Cognitive Status: Within Functional Limits for tasks assessed Arousal/Alertness: Awake/alert Focused Attention: Appears intact Sustained Attention: Appears intact Memory: Appears intact Awareness: Appears intact Problem Solving: Appears intact Safety/Judgment: Appears intact Sensation Sensation Light Touch: Appears Intact Proprioception: Impaired Detail Proprioception Impaired Details: Impaired RUE;Impaired LUE;Impaired RLE;Impaired LLE Coordination Gross Motor Movements are Fluid and Coordinated: No Fine Motor Movements are Fluid and Coordinated: No Coordination and Movement Description: impaired due to significant hypertonia, muscle spasms, paresis, and impaired motor planning Heel Shin Test: unable to due to tone/decreased coordination Motor  Motor Motor: Abnormal postural alignment and control;Abnormal tone;Ataxia;Primitive reflexes present;Motor impersistence;Other (comment) Motor - Discharge Observations: Continued hypertonia in all 4 extremities limiting functional mobility, L UE and R LE most affected  Mobility Bed Mobility Bed Mobility: Rolling Right;Rolling Left;Supine to Sit;Sit to Supine;Scooting to [facility] Rolling Right: Moderate Assistance - Patient 50-74% Rolling Left: Moderate Assistance - Patient 50-74% Supine to Sit: Minimal Assistance - Patient > 75% Sit to Supine: Moderate Assistance - Patient 50-74% Scooting to [facility]: 2 Helpers Transfers Transfers: Sit to Stand;Stand to Manufacturing engineer via [place] to Stand: 2 Helpers Stand to Sit: 2 Helpers Lateral/Scoot Transfers: Moderate Assistance - Patient 50-74%;2 Helpers;Maximal Assistance - Patient 25-49% (fluctuates with 1-2 person assist  based on tone and fatigue) Transfer (Assistive device): Other (Comment) (slide board) Transfer via Lift Equipment:  (dependent hoyer lift with +2 assist for safety) Locomotion  Gait Ambulation: No Gait Gait: No Stairs / Additional Locomotion Stairs: No Wheelchair Mobility Wheelchair Mobility: Yes Wheelchair Propulsion: Both lower extermities (backwards propulsion) Wheelchair Parts Management: Needs assistance Distance: ft, limited functional propulsion  Trunk/Postural Assessment  Cervical Assessment Cervical Assessment: Exceptions to  WFL (forward head, full rotation and flexion ROM, limited extension) Thoracic Assessment Thoracic Assessment: Exceptions to [facility] (bilateral shoulder protraction due to tone and increased thoracic kyphosis) Lumbar Assessment Lumbar Assessment: Exceptions to [facility] (posterior pelvic tilt in sitting) Postural Control Postural Control: Deficits on evaluation Head Control: Improved, still requiring head support for wheelchair intermittently Trunk Control: Improved - requires min A-supervision assist for sitting balance Righting Reactions: absent Protective Responses: absent Postural Limitations: significantly decreased  Balance Balance Balance Assessed: Yes Static Sitting Balance Static Sitting - Balance Support: Feet supported Static Sitting - Level of Assistance: 5: Stand by assistance Dynamic Sitting Balance Dynamic Sitting - Balance Support: Feet supported Dynamic Sitting - Level of Assistance: 4: Min assist Static Standing Balance Static Standing - Balance Support: During functional activity;No upper extremity supported Static Standing - Level of Assistance: 1: +2 Total assist Extremity Assessment      RLE Assessment RLE Assessment: Exceptions to [facility] Passive Range of Motion (PROM) Comments: unable to move ankle to neutral, lacking 18 degrees, with excessive PF due to significant LE extensor tone, increased resistance to movement in all planes  due to tone, responsed to prolonged pressure Active Range of Motion (AROM) Comments: Able to volitionally move at hip and knee with intermittent disruptions in movement due to extensor or flexor tone General Strength Comments: Grossly at least 3+/5, unable to formally test due to systemic tone, trace to no volitional movement at ankle due to PF tone RLE Tone RLE Tone: Severe;Hypertonic Hypertonic Details: significant extensor tone noted in standing with severe ankle PF/inversion, block ankle or use bi-valved cast to reduce inversion in standing LLE Assessment LLE Assessment: Exceptions to [facility] Passive Range of Motion (PROM) Comments: able to get ankle to neutral with prolonged stretch; increased resistance to movement in all directions due to systemic tone Active Range of Motion (AROM) Comments: Able to volitionally move at hip, knee, and ankle with intermittent disruptions in movement due to extensor or flexor tone General Strength Comments: Grossly at least 3+/5, unable to formally test due to systemic tone LLE Tone LLE Tone: Severe;Hypertonic Hypertonic Details: less severe than RLE    Doreene Burke, PT, DPT Page Spiro, PT, DPT, CSRS 08/16/2019, 6:24 PM

## 2019-08-15 NOTE — Progress Notes (Signed)
Chase Caldwell PHYSICAL MEDICINE & REHABILITATION PROGRESS NOTE  Subjective/Complaints: Had another good night. Pain seems controlled  ROS: Patient denies fever, rash, sore throat, blurred vision, nausea, vomiting, diarrhea, cough, shortness of breath or chest pain, joint or back pain, headache, or mood change.   Objective: Vital Signs: Blood pressure (!) 135/93, pulse 80, temperature 97.6 F (36.4 C), resp. rate 19, height 6' (1.829 m), weight 118.6 kg, SpO2 100 %. MR BRAIN W WO CONTRAST  Result Date: 08/14/2019 CLINICAL DATA:  Abnormal basal ganglia seen on previous study of 07/30/2019, felt secondary to hypoxic ischemic injury. EXAM: MRI HEAD WITHOUT AND WITH CONTRAST TECHNIQUE: Multiplanar, multiecho pulse sequences of the brain and surrounding structures were obtained without and with intravenous contrast. CONTRAST:  32mL GADAVIST GADOBUTROL 1 MMOL/ML IV SOLN COMPARISON:  06/18/2019.  07/30/2019. FINDINGS: Brain: No evidence of new acute or subacute insult. 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 No widespread cortical insult. Elsewhere, no focal brain abnormality is seen. No hemorrhage, hydrocephalus or extra-axial collection. Vascular: Major vessels at the base of the brain show flow. Skull and upper cervical spine: Negative Sinuses/Orbits: Clear except for mild mucosal thickening and a small amount of fluid in the left maxillary sinus. Orbits negative. Other: None IMPRESSION: No new acute finding. Evolutionary changes of previously seen acute infarction affecting the corpus striatum bilaterally, presumed secondary to hypoxic ischemic injury. Electronically Signed   By: Nelson Chimes M.D.   On: 08/14/2019 17:09   No results for input(s): WBC, HGB, HCT, PLT in the last 72 hours. No results for input(s): NA, K, CL, CO2, GLUCOSE, BUN, CREATININE, CALCIUM in the last 72 hours.  Physical Exam: BP (!)  135/93 (BP Location: Right Arm)   Pulse 80   Temp 97.6 F (36.4 C)   Resp 19   Ht 6' (1.829 m)   Wt 118.6 kg   SpO2 100%   BMI 35.46 kg/m  Constitutional: No distress . Vital signs reviewed. HEENT: EOMI, oral membranes moist Neck: supple Cardiovascular: RRR without murmur. No JVD    Respiratory/Chest: CTA Bilaterally without wheezes or rales. Normal effort    GI/Abdomen: BS +, non-tender, non-distended Ext: no clubbing, cyanosis, or edema Psych: pleasant and cooperative Skin: former PEG site clean, closed   Neuro: VERY ALERT. Speech fairly clear. Facial paralysis, stable Vision remains limited--more shapes?  generalized extensor tone less dramatic. Right heel cord still tight.  Motor:  moving all extremities  Assessment/Plan: 1. 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  Physiatrist is providing close team supervision and 24 hour management of active medical problems listed below.  Physiatrist and rehab team continue to assess barriers to discharge/monitor patient progress toward functional and medical goals  Care Tool:  Bathing        Body parts bathed by helper: Right arm, Left arm, Chest, Abdomen, Front perineal area, Buttocks, Right upper leg, Left upper leg, Right lower leg, Left lower leg, Face     Bathing assist Assist Level: 2 Helpers     Upper Body Dressing/Undressing Upper body dressing   What is the patient wearing?: Pull over shirt    Upper body assist Assist Level: Total Assistance - Patient < 25%    Lower Body Dressing/Undressing Lower body dressing      What is the patient wearing?: Pants     Lower body  assist Assist for lower body dressing: 2 Helpers     Toileting Toileting Toileting Activity did not occur Landscape architect and hygiene only): Refused  Toileting assist Assist for toileting: 2 Helpers      Transfers Chair/bed transfer  Transfers assist  Chair/bed transfer activity did not occur: Safety/medical concerns (safety/medical b/c we introduced new technique??)  Chair/bed transfer assist level: 2 Helpers Chair/bed transfer assistive device: Sliding board   Locomotion Ambulation   Ambulation assist   Ambulation activity did not occur: Safety/medical concerns          Walk 10 feet activity   Assist  Walk 10 feet activity did not occur: Safety/medical concerns        Walk 50 feet activity   Assist Walk 50 feet with 2 turns activity did not occur: Safety/medical concerns         Walk 150 feet activity   Assist Walk 150 feet activity did not occur: Safety/medical concerns         Walk 10 feet on uneven surface  activity   Assist Walk 10 feet on uneven surfaces activity did not occur: Safety/medical concerns         Wheelchair     Assist Will patient use wheelchair at discharge?:  (likely will require dependent w/c transport)      Wheelchair assist level: Maximal Assistance - Patient 25 - 49% Max wheelchair distance: 50ft    Wheelchair 50 feet with 2 turns activity    Assist        Assist Level: Total Assistance - Patient < 25% (total assist for turning, max assist for controll w/retropulsion)   Wheelchair 150 feet activity     Assist     Assist Level: Dependent - Patient 0%   BP (!) 135/93 (BP Location: Right Arm)   Pulse 80   Temp 97.6 F (36.4 C)   Resp 19   Ht 6' (1.829 m)   Wt 118.6 kg   SpO2 100%   BMI 35.46 kg/m  Medical Problem List and Plan: 1. Diffuse weakness with hypertonicity and deficits with self-care, swallowing, cognition secondary to TBI with significant Psych history.   Continue CIR . Family ed this week, parents present.  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 Areas of infarct in bilateral corpus striatum still visualized  and c/w prior MRI and subsequent evoloution.  -folate within normal limits, vitamin B12-low normal, magnesium nl   B12 supplementation initiated   -Mobility Assessment performed 8/4    2.  Antithrombotics: -DVT/anticoagulation:  Pharmaceutical: Lovenox             -antiplatelet therapy: N/a 3. Chronic LBP/Pain Management:                8/3: Well controlled with Tylenol and Neurontin scheduled, and Norco PRN.  4. Mood/agitation/hx of bipolar disorder: Neuropsych and psychiatry to follow during his stay. LCSW to follow for evaluation and support             -antipsychotic agents:  seroquel  -continue cymbalta, zoloft  -added gabapentin as above  -ativan BID discontinued.  -will need outpt psych program   -continues to be much improved as a whole 5. Neuropsych: This patient is not fully capable of making decisions on his own behalf. 6. Skin/Wound Care: Routine pressure relief measures.  7. Fluids/Electrolytes/Nutrition:  11. Facial Fx/Maxillary fracture s/p repair with associated dysphagia: Wires removed by ENT  Dysphagia 1 thin diet--advance per SLP. PEG removed 9/23 without complications  12. ABLA:   8/2: Hgb is 12.9  Continue to monitor 13. Prediabetes: Hyperglycemia due to tube feeds. Continue feeds at nights as intake remains variable.    Dc cbg checks/SSI  14.  Severe muscle spasms with extensor tone: better controlled with Baclofen, serial casting, ROM with therapies, Klonopin.   8/5 serial casting today   -botox as outpatient   -will give valium 5mg  30" prior to casting today 15.  Tachycardia:  Continue propanolol which was increased to 40 3 times daily on 7/11  See #14, 18  HR controlled 8/5 16.  Transaminitis  ALT elevated on 7/23. Repeat in outpatient labs.  17.  Hypokalemia  Now stable off supplementation  18.  Hypertension: remains stable with Lisinopril and Propanolol.  19.  Acute lower UTI with Klebsiella and Pseudomonas: treatment completed   LOS:  27 days A FACE TO FACE EVALUATION WAS PERFORMED  Meredith Staggers 08/15/2019, 8:41 AM

## 2019-08-15 NOTE — Progress Notes (Signed)
Physical Therapy Session Note  Patient Details  Name: Chase Caldwell MRN: [id number] Date of Birth: [birth year]  Today's Date: 08/15/2019 PT Individual Time: 0800-0900 PT Individual Time Calculation (min): 60 min  Short Term Goals: Week 4:  PT Short Term Goal 1 (Week 4): STG=LTG due to ELOS.  Skilled Therapeutic Interventions/Progress Updates:     Patient sitting up in bed finishing breakfast with his wife upon PT arrival. Patient alert and agreeable to PT session. Patient denied pain during session. [name] wife participated in family education throughout session. Discussed d/c planning, loaner w/c and equipment to be provided at d/c, and home set up and safety, and strategies for tone managment while patient finished breakfast with total A due to UE tone.   Therapeutic Activity: Bed Mobility: Patient performed bridging with min cues to don shorts bed level with total A and sat up with legs crossed from elevated [facility] with supervision to [facility] hospital gown and don tank top with max A. [name] wife donned B socks and shoes with total A bed level. Patient performed rolling R/L with min-mod A with cues for bringing knees to chest and turning his head to roll for hoyer sling placement. All assist provided by his wife. PT provided min cues for sling placement.  Transfers: [name] wife performed [facility] lift transfer with patient with PT provided second person assist as needed for safety and equipment management. Educated patient and his wife of L UE placement outside of the sling during transfer to avoid injury or pain from UE extensor tone. Assisted with positioning patient's hip and education patient's wife of tilting the [name] back slightly for improved hip placement prior to lowering the patient in the chair with 2 person assist at knees and hips for correct hip placement. Patient able to push back in the chair with chair returned to upright position without assist.   Wheelchair Mobility:   Patient propelled TIS wheelchair backwards 30 feet x2 using B LEs with CGA for safety/guiding due to visual impairments. He ascended/descended a ramp x2 with CGA-min A for safety and control, ascended backwards and descended forwards. Educated on benefits of patient assist for reduced caregiver burden. Wife repots a ramp will be in place at their home by tomorrow. Patient then propelled the [name] forwards with B LEs >100 feet with supervision CGA for safety through a doorway and performing 90 degree turns x2. Provided verbal cues for slow speed through doorway and directions for turns. Patient reported ability to see shapes of doorways and walls to assist with navigation with min cues.  [name] wife donned/doffed leg rests independently during session. PT educated about use of seat belt for safety whenever patient is in the [name], patient and his wife stated understanding.   Patient in [place] w/c at the nurses station due to his wifes departure at end of session with breaks locked, seat belt fastened, headphones donned, and all needs within reach.    Therapy Documentation Precautions:  Precautions Precautions: Fall Precaution Comments: systemic increased tone with L UE and R LE most affected Required Braces or Orthoses: Other Brace Other Brace: abdominal binder (to keep PEG safe) Restrictions Weight Bearing Restrictions: No    Therapy/Group: Individual Therapy  Anasophia Pecor L Kameryn Tisdel PT, DPT  08/15/2019, 4:57 PM

## 2019-08-15 NOTE — Progress Notes (Signed)
Patient is calm and rested well all night. No pain or discomfort observed.

## 2019-08-15 NOTE — Anesthesia Postprocedure Evaluation (Signed)
Anesthesia Post Note  Patient: Chase Caldwell  Procedure(s) Performed: MRI WITH ANESTHESIA -BRAIN (N/A )     Patient location during evaluation: PACU Anesthesia Type: General Level of consciousness: sedated and patient cooperative Pain management: pain level controlled Vital Signs Assessment: post-procedure vital signs reviewed and stable Respiratory status: spontaneous breathing Cardiovascular status: stable Anesthetic complications: no   No complications documented.  Last Vitals:  Vitals:   08/14/19 2035 08/15/19 0526  BP: 138/86 (!) 135/93  Pulse: 95 80  Resp: 20 19  Temp: 36.6 C 36.4 C  SpO2: 96% 100%    Last Pain:  Vitals:   08/14/19 2240  TempSrc:   PainSc: 0-No pain                 Nolon Nations

## 2019-08-15 NOTE — Progress Notes (Signed)
Occupational Therapy Session Note  Patient Details  Name: Chase Caldwell MRN: [id number] Date of Birth: June 14, 1980  Today's Date: 08/15/2019 OT Individual Time: 1004-1100 OT Individual Time Calculation (min): 56 min    Short Term Goals: Week 1:  OT Short Term Goal 1 (Week 1): Pt will sitEOB with mod A in prep for functional ADL task OT Short Term Goal 1 - Progress (Week 1): Met OT Short Term Goal 2 (Week 1): Pt will self feed with AE with max A OT Short Term Goal 2 - Progress (Week 1): Met OT Short Term Goal 3 (Week 1): Pt will tolerate sitting upright out of bed for ~3 hrs OT Short Term Goal 3 - Progress (Week 1): Progressing toward goal OT Short Term Goal 4 (Week 1): Pt will roll to right and left with max A of one caregiver OT Short Term Goal 4 - Progress (Week 1): Progressing toward goal  Skilled Therapeutic Interventions/Progress Updates:    1:1. Pt received in bed with parents present. Pt and family agreeable to going over repositioning pt in w/c, bridging and hooklying positions for rolling and toileting at bed level as well as PROM program. Pt completes SB transfer with MOD A+2 to EOM to/from TIS using bobath positioning to decrease extension pattern. Pt assumes hooklying position with min A for breaking RLE tone. Pt able to clear buttocks fully on mat with A only for maintaining foot position. Pt parents both practived barcing feet for briding and rolling pt in hooklying position with MIN A. Pt able to hoojlying>sitting EOM with MIN A for trunk elevation! Pt and family provided PROM handout after returning to bed in same manner as getting on mat. Pt family with no questions after OT provided justification for stretching in opposite directions of hypertonicity. Exited session with pt seated in bed, exi talarm on and parents present.    Therapy Documentation Precautions:  Precautions Precautions: Fall Precaution Comments: systemic increased tone with L UE and R LE most  affected Required Braces or Orthoses: Other Brace Other Brace: abdominal binder (to keep PEG safe) Restrictions Weight Bearing Restrictions: No (watch R foot in standing due to equanovarus position secondary to tone) General:   Vital Signs:   Pain:   ADL: ADL Eating: Dependent Where Assessed-Eating: Bed level Grooming: Dependent Where Assessed-Grooming: Wheelchair Upper Body Bathing: Dependent Where Assessed-Upper Body Bathing: Wheelchair Lower Body Bathing: Dependent Where Assessed-Lower Body Bathing: Edge of bed Upper Body Dressing: Dependent Where Assessed-Upper Body Dressing: Wheelchair Lower Body Dressing: Dependent Toileting: Dependent Toilet Transfer: Not assessed Social research officer, government: Not assessed Vision   Perception    Praxis   Exercises:   Other Treatments:     Therapy/Group: Individual Therapy  Tonny Branch 08/15/2019, 10:59 AM

## 2019-08-15 NOTE — Progress Notes (Signed)
Physical Therapy Session Note  Patient Details  Name: Chase Caldwell MRN: [id number] Date of Birth: 11-Oct-1980  Today's Date: 08/15/2019 PT Co-Treatment Time: 1435-1545 PT Co-Treatment Time Calculation (min): 70 min  Short Term Goals: Week 4:  PT Short Term Goal 1 (Week 4): STG=LTG due to ELOS.  Skilled Therapeutic Interventions/Progress Updates:     Patient in bed with his parents at bedside at beginning of session. Patient alert and agreeable to session with Wendi, OT present for co-treatment focused on R LE casting for extensor tone causing increased PF and inversion with spasticity. Nursing provided Valium at beginning of session to assist with patient's comfort during casting per MD orders.   Performed B LE on trunk rotation with patient in hook-lying due to increased trunk and LE flexor tone at beginning of session. Reduced tone and patient demonstrating increased relaxation after ~10 min of tone management with rotation and manual pressure to LEs and L UE.   Patient performed rolling R/L with min A of 1 person and rolling supine<>prone with max A +2 due to positioning of small red wedge and pillows under patient's trunk for reduced pressure on his shoulders in prone. Provided R DF stretch with patient in prone with knee flexed prior to and throughout casting, see OT note for details.  R LE cast with good placement and patient denied pain or discomfort at end of session. OT to reassess later this afternoon. RN, patient, and his parents made aware that the cast is to be worn until tomorrow afternoon, as long as patient is tolerating it. Then Wendi, OT will Bi-valve and pad the cast for the patient to take home with him.   Patient in bed positioned in sitting with multiple pillows for support/comfort with his parents at bedside at end of session.   Therapy Documentation Precautions:  Precautions Precautions: Fall Precaution Comments: systemic increased tone with L UE and R LE most  affected Required Braces or Orthoses: Other Brace Other Brace: abdominal binder (to keep PEG safe) Restrictions Weight Bearing Restrictions: No    Therapy/Group: Co-Treatment  Chase Caldwell L Emmalene Kattner PT, DPT  08/15/2019, 5:34 PM

## 2019-08-16 ENCOUNTER — Inpatient Hospital Stay (HOSPITAL_COMMUNITY): Payer: Medicaid Other | Admitting: Physical Therapy

## 2019-08-16 ENCOUNTER — Inpatient Hospital Stay (HOSPITAL_COMMUNITY): Payer: Medicaid Other

## 2019-08-16 MED ORDER — PROPRANOLOL HCL 60 MG PO TABS: 60.0000 mg | ORAL_TABLET | Freq: Three times a day (TID) | ORAL | 0 refills | Status: DC

## 2019-08-16 MED ORDER — QUETIAPINE FUMARATE 200 MG PO TABS: 200.0000 mg | ORAL_TABLET | Freq: Every day | ORAL | 0 refills | Status: DC

## 2019-08-16 MED ORDER — QUETIAPINE FUMARATE 50 MG PO TABS: 50.0000 mg | ORAL_TABLET | Freq: Every day | ORAL | 0 refills | Status: DC

## 2019-08-16 MED ORDER — LORAZEPAM 0.5 MG PO TABS: 0.5000 mg | ORAL_TABLET | Freq: Two times a day (BID) | ORAL | 0 refills | Status: DC | PRN

## 2019-08-16 MED ORDER — LISINOPRIL 10 MG PO TABS: 10.0000 mg | ORAL_TABLET | Freq: Every day | ORAL | 0 refills | Status: AC

## 2019-08-16 MED ORDER — GABAPENTIN 250 MG/5ML PO SOLN: 100.0000 mg | Freq: Every day | ORAL | 0 refills | Status: DC

## 2019-08-16 MED ORDER — BACLOFEN 20 MG PO TABS: 20.0000 mg | ORAL_TABLET | Freq: Three times a day (TID) | ORAL | 0 refills | Status: DC

## 2019-08-16 MED ORDER — ADULT MULTIVITAMIN W/MINERALS CH: 1.0000 | ORAL_TABLET | Freq: Every day | ORAL | 0 refills | Status: AC

## 2019-08-16 MED ORDER — SERTRALINE HCL 50 MG PO TABS: 50.0000 mg | ORAL_TABLET | Freq: Every day | ORAL | 0 refills | Status: DC

## 2019-08-16 MED ORDER — THIAMINE HCL 100 MG PO TABS: 100.0000 mg | ORAL_TABLET | Freq: Every day | ORAL | 0 refills | Status: AC

## 2019-08-16 MED ORDER — HYDROCODONE-ACETAMINOPHEN 5-325 MG PO TABS: 1.0000 | ORAL_TABLET | Freq: Four times a day (QID) | ORAL | 0 refills | Status: DC | PRN

## 2019-08-16 MED ORDER — DULOXETINE HCL 60 MG PO CPEP: 60.0000 mg | ORAL_CAPSULE | Freq: Every day | ORAL | 0 refills | Status: DC

## 2019-08-16 MED ORDER — CLONAZEPAM 1 MG PO TABS: 1.0000 mg | ORAL_TABLET | Freq: Four times a day (QID) | ORAL | 0 refills | Status: DC

## 2019-08-16 MED ORDER — CYANOCOBALAMIN 1000 MCG PO TABS: 1000.0000 ug | ORAL_TABLET | Freq: Every day | ORAL | 0 refills | Status: AC

## 2019-08-16 NOTE — Progress Notes (Addendum)
Occupational Therapy Note (late entry for 08/15/2019)  Patient Details  Name: Chase Caldwell MRN: [id number] Date of Birth: 1980-06-17  Pt seen in conjunction with PT for fabrication of serial cast Rt LE.  Pt positioned in prone and tolerated it well this date.  Tone inhibitory techniques performed followed by passive stretch of ankle.  Cast applied and appears to fit well - pt without complaint of pain/pressure.  See PT note for details.  Checked splint 1.5 hours after completion of cast.  Pt without complaint.  No obvious signs of pressure noted.  Velfoam padding added to distal portion of cast for additional comfort.  Cast marked for bivalving.    [name]., OTR/L Acute Rehabilitation Services Pager [phone] Office [phone]      [name] 08/16/2019, 7:04 AM

## 2019-08-16 NOTE — Discharge Instructions (Signed)
Inpatient Rehab Discharge Instructions  Ewing Fandino Discharge date and time:  08/17/19  Activities/Precautions/ Functional Status: Activity: activity as tolerated Diet: soft foods Wound Care: Keep area clean and dry   Functional status:  ___ No restrictions     ___ Walk up steps independently _X_ 24/7 supervision/assistance   ___ Walk up steps with assistance ___ Intermittent supervision/assistance  ___ Bathe/dress independently ___ Walk with walker     _X__ Bathe/dress with assistance ___ Walk Independently    ___ Shower independently ___ Walk with assistance    ___ Shower with assistance _X__ No alcohol     ___ Return to work/school ________    COMMUNITY REFERRALS UPON DISCHARGE:    Home Health:   PT     OT    ST  SW                   Agency: [facility] Phone: [phone] *Please expect follow-up within 2-3 days from your discharge to schedule your home visit. If you have not received follow-up, be sure to contact the branch directly.*    Medical Equipment/Items Ordered: hoyer lift with extra large sling, hospital bed with gel overlay, drop arm bedside commode, specialty wheelchair tilt in space                                                 Agency/Supplier: [facility] [facility] PATIENT/FAMILY: 1) PCS referral submitted with [facility] [phone] to check status of your application.  2) CAP referral submitted with Nira Conn Fields/RN with CAP/DA in [facility]. Referral should be in system. Please contact Don[facility] Scull [place] [facility] with [facility] [phone]) to check status of application. A letter will be mailed to the home for documentation needed.   3) Appointment set up with [facility] on August 12th at 8:00 AM   Special Instructions:  FOR SAFETY, 24 HOUR SUPERVISION IS RECOMMENDED UNTIL OTHERWISE DIRECTED BY A PHYSICIAN.  DUE TO RISK OF INJURY, PLEASE REMOVE  GUNS, KNIVES, OR ANY OTHER POTENTIALLY DANGEROUS OBJECTS AND HAZARDOUS MATERIALS FROM THE HOME.    My questions have been answered and I understand these instructions. I will adhere to these goals and the provided educational materials after my discharge from the hospital.  Patient/Caregiver Signature _______________________________ Date __________  Clinician Signature _______________________________________ Date __________  Please bring this form and your medication list with you to all your follow-up doctor's appointments.

## 2019-08-16 NOTE — Progress Notes (Signed)
Physical Therapy Session Note  Patient Details  Name: Chase Caldwell MRN: [id number] Date of Birth: 1980-12-19  Today's Date: 08/16/2019 PT Individual Time: 5697-9480 PT Individual Time Calculation (min): 73 min   Short Term Goals: Week 4:  PT Short Term Goal 1 (Week 4): STG=LTG due to ELOS.  Skilled Therapeutic Interventions/Progress Updates:    Pt received supine in bed with his mother present and pt agreeable to therapy session. Pt's mother denies any questions/concerns from education performed this AM and denies any additional questions/concerns regarding discharge. She reports that DME is being delivered today. Supine>sitting R EOB, HOB partially elevated, with min assist for trunk control and safety - pt self selects to come to long sitting in bed prior to rotating hips to EOB - with min assist for trunk control and increased time pt able to bring LEs off EOB and scoot hips forward. L lateral scoot transfer EOB>w/c using transfer board with min assist for trunk control/LE management while +2 assist placed board - mod assist for scooting hips - pt able to use LEs to minimally lift/clear hips up but requires assist for rotating pelvis during transfer.  Reinforced earlier education/training via having pt's mother assist with w/c leg rest management during session. Pt requests to perform standing frame. Donned R LE post-op shoe with heel wedge - pt still in R LE cast. Transported to gym in w/c for time management and energy conservation.  Stood in standing frame for 23minutes then 3 minutes (seated break between):  - using gait belt around knees to increase hip adduction to allow LEs to line up with knee pad of frame - while standing initially unable to get L heel flat on ground but with increased time in weightbearing tone decreased allowing L foot flat - R LE cast limiting R ankle ROM - while standing demos L hip shift with spine curving to R (similar to how pt rests in bed with constant R lateral  trunk flexion) - therapist provided manual facilitation for improved hip alignment but anticipate this is impacted by decreased R LE ankle ROM  - while standing provided theraball for pt to hold on standing frame table to force bimanual task and increased core activation  2nd standing bout time limited as pt reporting increased R LE discomfort due to cast Performed B LE backwards w/c propulsion ~138ft back to room with min assist when going down straight wide hallway (to avoid veering R/L) and mod assist when making turns - may require increased assist at this time due to pt not being able to adequately use R LE in task due post-op shoe and cast not having sufficient grip on the floor. R lateral scoot transfer w/c>EOB using transfer board with min assist of 1 for trunk control and LE management while +2 assist placed board - scooted R with mod assist of 1. Sit>supine min assist for trunk control. Assisted pt with improved positioning in bed to partial chair position - safety measures placed on bed and pillows, wedge provided for support and positioning - left with needs in reach and his mother present.   Therapy Documentation Precautions:  Precautions Precautions: Fall Precaution Comments: systemic increased tone with L UE and R LE most affected Required Braces or Orthoses: Other Brace Other Brace: abdominal binder (to keep PEG safe) Restrictions Weight Bearing Restrictions: No  Pain:   Reports he is starting to have some discomfort from his R LE cast - limited 2nd standing trial as pain increased - plan for OT  to bi-valve cast today.   Therapy/Group: Individual Therapy  Tawana Scale , PT, DPT, CSRS  08/16/2019, 12:57 PM
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
## 2019-08-16 NOTE — Progress Notes (Signed)
Occupational Therapy Discharge Summary  Patient Details  Name: Chase Caldwell MRN: [id number] Date of Birth: 08-21-1980  Today's Date: 08/16/2019 OT Individual Time: 1740-8144 OT Individual Time Calculation (min): 45 min   Pt received in bed agreeable to bathing and dressing. Pt requires total A for LB bathing and MAX A for UB bathing (pt able to wash face and chest. Pt completes UB dressing with MAX A pt punching arms through sitting up in unsupported circle sitting and wiggles shirt down chest/back. Pt requires total A to don pants and able ot bridge hips with A to stabilze feet only as OT advances pants past hips. Mother present to assist with all bathing/dressing tasks except peri vathing for privacy. Pt provided with built up wash clothes for under R fingers to prevent flexion contractures after OT provides PROM to wrist and digit to full extension in digits and 10* ext in wrist. Exited sesion wih tpt seated in bed, exit alarm on and call lig htin reach  Patient has met 7 of 8 long term goals due to improved activity tolerance, improved balance, postural control and ability to compensate for deficits.  Patient to discharge at overall Total Assist level.  [name] care partner is independent to provide the necessary physical and cognitive assistance at discharge. Wife, Mother and father have been present for many education session, are independent with hoyer lift transfers, bed level bathing/dressing and toileting. Pt is able to bridge hips for ease of LB dressing with MIN A and toileting for bed pan placement and CM. Pt continues to be limited by extreme hypertonicity and spasticity impacting functional performance of BADLs  Reasons goals not met: Pt continues to require MAX-TOTAL A for self feeding d/t visual, perceptual and neuromuscular deficits  Recommendation:  Patient will benefit from ongoing skilled OT services in home health setting to continue to advance functional skills in the area of  BADL and Reduce care partner burden and tone managment.  Equipment: DABSC  Reasons for discharge: treatment goals met and discharge from hospital  Patient/family agrees with progress made and goals achieved: Yes  OT Discharge Precautions/Restrictions  Precautions Precautions: Fall Precaution Comments: systemic increased tone with L UE and R LE most affected Required Braces or Orthoses: Other Brace Restrictions Weight Bearing Restrictions: No General   Vital Signs Therapy Vitals Temp: 97.6 F (36.4 C) Pulse Rate: 66 Resp: 18 BP: (!) 136/95 Patient Position (if appropriate): Lying Oxygen Therapy SpO2: 98 % O2 Device: Room Air Pain   ADL ADL Eating: Maximal assistance Where Assessed-Eating: Wheelchair Grooming: Maximal assistance Where Assessed-Grooming: Wheelchair Upper Body Bathing: Maximal assistance Where Assessed-Upper Body Bathing: Wheelchair Lower Body Bathing: Dependent Where Assessed-Lower Body Bathing: Bed level Upper Body Dressing: Maximal assistance Where Assessed-Upper Body Dressing: Wheelchair Lower Body Dressing: Dependent Where Assessed-Lower Body Dressing: Bed level Toileting: Dependent Toilet Transfer: Not assessed Social research officer, government: Not assessed Vision Patient Visual Report: Blurring of vision Vision Assessment?: Yes Eye Alignment: Impaired (comment) Convergence: Impaired (comment) Additional Comments: damaged from injury; able to see colors now/able to identify family members in room wiht increased time Perception  Spatial Orientation: impaired Praxis Praxis: Impaired Praxis Impairment Details: Motor planning Cognition Overall Cognitive Status: Within Functional Limits for tasks assessed Arousal/Alertness: Awake/alert Focused Attention: Appears intact Sustained Attention: Appears intact Memory: Appears intact Awareness: Appears intact Awareness Impairment: Emergent impairment Problem Solving: Appears intact Safety/Judgment:  Appears intact [employer] Scales of Cognitive Functioning: Purposeful/appropriate Sensation Sensation Light Touch: Appears Intact Proprioception: Impaired Detail Proprioception Impaired Details: Impaired  RUE;Impaired LUE;Impaired RLE;Impaired LLE Stereognosis: Not tested Coordination Gross Motor Movements are Fluid and Coordinated: No Fine Motor Movements are Fluid and Coordinated: No Coordination and Movement Description: impaired due to significant hypertonia, muscle spasms, paresis, and impaired motor planning Finger Nose Finger Test: unable due to tone Heel Shin Test: unable to due to tone/decreased coordination Motor  Motor Motor: Abnormal postural alignment and control;Abnormal tone;Ataxia;Primitive reflexes present;Motor impersistence;Other (comment) Motor - Skilled Clinical Observations: significant hypertonia in all 4 extremities, whole body muscle spasms Motor - Discharge Observations: Continued hypertonia in all 4 extremities limiting functional mobility, L UE and R LE most affected Mobility  Bed Mobility Rolling Right: Minimal Assistance - Patient > 75% Rolling Left: Minimal Assistance - Patient > 75% Supine to Sit: Minimal Assistance - Patient > 75% Sit to Supine: Moderate Assistance - Patient 50-74% Scooting to HOB: Moderate Assistance - Patient 50-74% Transfers Sit to Stand: 2 Helpers Stand to Sit: 2 Helpers  Trunk/Postural Assessment  Cervical Assessment Cervical Assessment:  (forward head) Thoracic Assessment Thoracic Assessment:  (shoulder protraction d/t tone-kyphotic) Lumbar Assessment Lumbar Assessment:  (post pelvic tilt) Postural Control Postural Control: Deficits on evaluation Head Control: Improved, still requiring head support for wheelchair intermittently Trunk Control: Improved - requires min A-supervision assist for sitting balance Righting Reactions: absent Protective Responses: absent  Balance Static Sitting Balance Static Sitting -  Level of Assistance: 5: Stand by assistance Dynamic Sitting Balance Dynamic Sitting - Balance Support: Feet supported Dynamic Sitting - Level of Assistance: 4: Min assist Sitting balance - Comments: mod-max A to maintain sitting balance while completing functional tasks EOB Static Standing Balance Static Standing - Balance Support: During functional activity;No upper extremity supported Static Standing - Level of Assistance: 1: +2 Total assist Extremity/Trunk Assessment RUE Assessment RUE Assessment: Exceptions to [facility] RUE Body System: Neuro Brunstrum level for arm: Stage IV Movement is deviating from synergy Brunstrum level for hand: Stage IV Movements deviating from synergies LUE Assessment General Strength Comments: Pt with more tone in left UE than right - unable to move fingers or make a fist; significnt extensor pattern with shoulder/elbow/forearm wiht intentional movements coupled with wrist flexion Brunstrum level for arm: Stage IV Movement is deviating from synergy Brunstrum level for hand: Stage III Synergies performed voluntarily LUE Tone LUE Tone: Severe;Hypertonic;Modified Ashworth Elbow - Modified Ashworth Scale for Grading Hypertonia LUE: More marked increase in muscle tone through most of the ROM, but affected part(s) easily moved Wrist - Modified Ashworth Scale for Grading Hypertonia LUE: Considerable increase in muschle tone, passive movement difficult Fingers - Modified Ashworth Scale for Grading Hypertonia LUE: Considerable increase in muschle tone, passive movement difficult   Tonny Branch 08/16/2019, 6:53 AM

## 2019-08-16 NOTE — Progress Notes (Signed)
PHYSICAL MEDICINE & REHABILITATION PROGRESS NOTE  Subjective/Complaints: Pt up in bed with OT and mom. In good spirits. Excited about getting home.  ROS: Patient denies fever, rash, sore throat, blurred vision, nausea, vomiting, diarrhea, cough, shortness of breath or chest pain, headache, or mood change.    Objective: Vital Signs: Blood pressure (!) 136/95, pulse 66, temperature 97.6 F (36.4 C), resp. rate 18, height 6' (1.829 m), weight 118.6 kg, SpO2 98 %. MR BRAIN W WO CONTRAST  Result Date: 08/14/2019 CLINICAL DATA:  Abnormal basal ganglia seen on previous study of 07/30/2019, felt secondary to hypoxic ischemic injury. EXAM: MRI HEAD WITHOUT AND WITH CONTRAST TECHNIQUE: Multiplanar, multiecho pulse sequences of the brain and surrounding structures were obtained without and with intravenous contrast. CONTRAST:  81mL GADAVIST GADOBUTROL 1 MMOL/ML IV SOLN COMPARISON:  06/18/2019.  07/30/2019. FINDINGS: Brain: No evidence of new acute or subacute insult. 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 No widespread cortical insult. Elsewhere, no focal brain abnormality is seen. No hemorrhage, hydrocephalus or extra-axial collection. Vascular: Major vessels at the base of the brain show flow. Skull and upper cervical spine: Negative Sinuses/Orbits: Clear except for mild mucosal thickening and a small amount of fluid in the left maxillary sinus. Orbits negative. Other: None IMPRESSION: No new acute finding. Evolutionary changes of previously seen acute infarction affecting the corpus striatum bilaterally, presumed secondary to hypoxic ischemic injury. Electronically Signed   By: Nelson Chimes M.D.   On: 08/14/2019 17:09   No results for input(s): WBC, HGB, HCT, PLT in the last 72 hours. No results for input(s): NA, K, CL, CO2, GLUCOSE, BUN, CREATININE, CALCIUM in the last 72 hours.  Physical Exam: BP  (!) 136/95 (BP Location: Right Arm)   Pulse 66   Temp 97.6 F (36.4 C)   Resp 18   Ht 6' (1.829 m)   Wt 118.6 kg   SpO2 98%   BMI 35.46 kg/m  Constitutional: No distress . Vital signs reviewed. HEENT: EOMI, oral membranes moist Neck: supple Cardiovascular: RRR without murmur. No JVD    Respiratory/Chest: CTA Bilaterally without wheezes or rales. Normal effort    GI/Abdomen: BS +, non-tender, non-distended Ext: no clubbing, cyanosis, or edema Psych: pleasant and cooperative Skin: former PEG site clean, closed, NSL right hand Neuro: alert, improved awareness and insight.. Facial paralysis, stable--speech clearer Vision remains limited but able to see colors on my shirt this morning.   generalized extensor tone less dramatic. Right heel cord still tight--in cast this morning. Right hand in significant flexor spasms. Motor:  moving all extremities  Assessment/Plan: 1. 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  Physiatrist is providing close team supervision and 24 hour management of active medical problems listed below.  Physiatrist and rehab team continue to assess barriers to discharge/monitor patient progress toward functional and medical goals  Care Tool:  Bathing        Body parts bathed by helper: Right arm, Left arm, Chest, Abdomen, Front perineal area, Buttocks, Right upper leg, Left upper leg, Right lower leg, Left lower leg, Face     Bathing assist Assist Level: 2 Helpers     Upper Body Dressing/Undressing Upper body dressing   What is the patient wearing?: Pull over shirt    Upper body assist Assist Level: Total Assistance - Patient < 25%  Lower Body Dressing/Undressing Lower body dressing      What is the patient wearing?: Pants     Lower body assist Assist for lower body dressing: 2 Helpers      Toileting Toileting Toileting Activity did not occur Landscape architect and hygiene only): Refused  Toileting assist Assist for toileting: 2 Helpers     Transfers Chair/bed transfer  Transfers assist  Chair/bed transfer activity did not occur: Safety/medical concerns (safety/medical b/c we introduced new technique??)  Chair/bed transfer assist level: Moderate Assistance - Patient 50 - 74% Chair/bed transfer assistive device: Sliding board   Locomotion Ambulation   Ambulation assist   Ambulation activity did not occur: Safety/medical concerns          Walk 10 feet activity   Assist  Walk 10 feet activity did not occur: Safety/medical concerns        Walk 50 feet activity   Assist Walk 50 feet with 2 turns activity did not occur: Safety/medical concerns         Walk 150 feet activity   Assist Walk 150 feet activity did not occur: Safety/medical concerns         Walk 10 feet on uneven surface  activity   Assist Walk 10 feet on uneven surfaces activity did not occur: Safety/medical concerns         Wheelchair     Assist Will patient use wheelchair at discharge?: Yes Type of Wheelchair:  (TIS)    Wheelchair assist level: Contact Guard/Touching assist Max wheelchair distance: >100 ft    Wheelchair 50 feet with 2 turns activity    Assist        Assist Level: Contact Guard/Touching assist   Wheelchair 150 feet activity     Assist     Assist Level: Dependent - Patient 0%   BP (!) 136/95 (BP Location: Right Arm)   Pulse 66   Temp 97.6 F (36.4 C)   Resp 18   Ht 6' (1.829 m)   Wt 118.6 kg   SpO2 98%   BMI 35.46 kg/m  Medical Problem List and Plan: 1. Diffuse weakness with hypertonicity and deficits with self-care, swallowing, cognition secondary to TBI with significant Psych history.   Continue CIR . Family ed this week, parents present.  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  -folate within normal limits, vitamin B12-low normal, magnesium nl   B12 supplementation initiated   -DC home 8/7 with 24 hours supervision of family    2.  Antithrombotics: -DVT/anticoagulation:  Pharmaceutical: Lovenox             -antiplatelet therapy: N/a 3. Chronic LBP/Pain Management:                8/3: Well controlled with Tylenol and Neurontin scheduled, and Norco PRN.  4. Mood/agitation/hx of bipolar disorder: Neuropsych and psychiatry to follow during his stay. LCSW to follow for evaluation and support             -antipsychotic agents:  seroquel  -continue cymbalta, zoloft  -added gabapentin as above  -ativan BID discontinued.  -will need outpt psych program---reaching out to psych today about establishing f/u   -continues to be much improved as a whole 5. Neuropsych: This patient is not fully capable of making decisions on his own behalf. 6. Skin/Wound Care: Routine pressure relief measures.  7. Fluids/Electrolytes/Nutrition:  11. Facial Fx/Maxillary fracture s/p repair with associated dysphagia: Wires removed by ENT             Dysphagia 1 thin diet--advance per SLP. PEG removed 8/12 without complications  12. ABLA:   8/2: Hgb is 12.9  Continue to monitor 13. Prediabetes: Hyperglycemia due to tube feeds. Continue feeds at nights as intake remains variable.    Dc cbg checks/SSI  14.  Severe muscle spasms with extensor tone: better controlled with Baclofen, serial casting, ROM with therapies, Klonopin.   8/5 repeat serial casting--tolerating well      8/6---towel role for right hand      -will contact office about setting up botox over next couple weeks.   -500u right wrist/finger flexors, right gastroc/soleus/tibialis posterior 15.  Tachycardia:  Continue propanolol which was increased to 40 3 times daily on 7/11  See #14, 18  HR controlled  8/5 16.  Transaminitis  ALT elevated on 7/23. F/u as outpt.  17.  Hypokalemia  Now stable off supplementation  18.  Hypertension: remains stable with Lisinopril and Propanolol.   -spoke with mom about these meds. Will continue for now  19.  Acute lower UTI with Klebsiella and Pseudomonas: treatment completed   LOS: 28 days A FACE TO [place] 08/16/2019, 10:13 AM

## 2019-08-16 NOTE — Progress Notes (Signed)
Occupational Therapy Note (late entry)  Patient Details  Name: Chase Caldwell MRN: [id number] Date of Birth: [birth year]  Pt seen in conjunction with PT with plan to fabricate serial cast Rt LE.   Pt positioned in prone, inhibitory techniques for increased spasticity performed followed by stretch of ankle.  Cast felt/padding applied with pt tolerating fairly well, however, as OT attempted to apply fiberglass, pt with complaint of increased pain/discomfort Lt shoulder due to positioning.  Pt rolled back to supine as pt unable to continue to tolerate prone position.  Attempted to apply fiberglass casting material, but unable to maintain proper positioning and alignment of foot and ankle due to severity of spasticity.  Casting aborted at this time for patient safety.  Will reattempt.  See PT note for detailed info.  [name]., OTR/L Acute Rehabilitation Services Pager [phone] Office [phone]      [name] 08/16/2019, 6:58 AM

## 2019-08-16 NOTE — Progress Notes (Signed)
Physical Therapy Session Note  Patient Details  Name: Chase Caldwell MRN: [id number] Date of Birth: 1980/12/22  Today's Date: 08/16/2019 PT Individual Time: 1000-1118 PT Individual Time Calculation (min): 78 min   Short Term Goals: Week 4:  PT Short Term Goal 1 (Week 4): STG=LTG due to ELOS.  Skilled Therapeutic Interventions/Progress Updates:    Pt received supine in bed, mother at bedside, pt agreeable to PT session. Pt denies pain on arrival. Pt cooperative throughout session, appears in good spirits and tone well controlled. Focus of session to review DC planning and home safety management with pt's mother.   Family ed: Lengthy discussion regarding: hospital bed features (all rails up and lowered bed height), call bell for emergency needs, trimmed nails and nail hygiene in BUE/BLE. Reviewed [name] features such as locking the brakes, tilting function, donning/doffing leg rests, tilting w/c for hoyer lift transfer, gait belts around chest/thighs to reduce risk of falling out of chair from flexor tone, and pressure relief in chair Q63min for 1-2 minutes. Reinforced importance of proper room/equipment setup prior to initiation of transfers. Also educated on falls and what to do if a fall occurs (call 911). Reviewed importance of compliance with hoyer lift transfers rather than attempting slide board transfers 2/2 high falls risk and pt's unpredictable tone. Pt verbalized understanding of all and all questions/concerns addressed.   Basic vision assessment - pt able to visually see therapist's shirt and identify the color (bright blue) as well as another therapists shirt (dark gray). Pt reports that he has increased difficulty with seeing these close > distanced. Pt unable to identify land-llne phone which was placed ~60ft in front of him, stated it was a call bell. Pt able to correctly identify number of fingers when held in front with both eyes open and L eye open, unsuccessful with R eye. Pt able to  visually track L<>R across midline however requires cervical rotation to assist. Pt demo's increased R peripheral field deficits.   Mobility: Pt performed supine<>sit with modA +1 with HOB flat, required TC/VC for BLE management off EOB and to bring trunk upright. Required assist for BLE positioning to feet flat and totalA for donning R post-op shoe. Pt performed x2 sit<>stands from EOB with mod/maxA +2 with no AD and B knee block, required facilitation at the hips and chest for upright posture , noted R lateral lean and forward trunk flexion with fatigue. Standing trials 65seconds each bout. Pt performed x2 sliding board transfers with maxA +1 from EOB to/from w/c, benefits from VC/TC for sequencing and technique (increasing horizontal motion rather than vertical), continues to require assist for BLE management during transfers. Once in bed, with HOB in trendelenburg, pt was able to perform posterior scooting while supine with modA, cues provided for bridging technique. Pt ended session in bed, mother at bedside, all rails up, needs in reach.    Therapy Documentation Precautions:  Precautions Precautions: Fall Precaution Comments: systemic increased tone with L UE and R LE most affected Required Braces or Orthoses: Other Brace Other Brace: abdominal binder (to keep PEG safe) Restrictions Weight Bearing Restrictions: No  Therapy/Group: Individual Therapy  Murna Backer P Keylen Uzelac PT 08/16/2019, 11:22 AM

## 2019-08-17 NOTE — Progress Notes (Deleted)
Call placed to [facility] to discuss if patient is going home via ambulance ( wife stated he was to go by ambulance).

## 2019-08-17 NOTE — Progress Notes (Addendum)
Occupational Therapy Note Late entry for 08/16/2019  Patient Details  Name: Chase Caldwell MRN: [id number] Date of Birth: Jan 27, 1980  Rt LE serial cast removed and bivalved for use at home.  Pt and mother instructed in the purpose and will establish wear schedule with them tomorrow.  Chase Caldwell., OTR/L Acute Rehabilitation Services Pager [phone] Office [phone]      Chase Caldwell M 08/17/2019, 7:27 AM

## 2019-08-17 NOTE — Plan of Care (Signed)
  Problem: RH BLADDER ELIMINATION Goal: RH STG MANAGE BLADDER WITH ASSISTANCE Description: STG Manage Bladder With mod I Assistance Outcome: Not Met (add Reason) Note: Patient requires total assist for urinal placement Goal: RH STG MANAGE BLADDER WITH MEDICATION WITH ASSISTANCE Description: STG Manage Bladder With Medication With min Assistance. Outcome: Not Met (add Reason)   Problem: Consults Goal: RH BRAIN INJURY PATIENT EDUCATION Description: Description: See Patient Education module for eduction specifics Outcome: Completed/Met Goal: Skin Care Protocol Initiated - if Braden Score 18 or less Description: If consults are not indicated, leave blank or document N/A Outcome: Completed/Met Goal: Nutrition Consult-if indicated Outcome: Completed/Met   Problem: RH BOWEL ELIMINATION Goal: RH STG MANAGE BOWEL WITH ASSISTANCE Description: STG Manage Bowel with mod I Assistance. Outcome: Completed/Met Goal: RH STG MANAGE BOWEL W/MEDICATION W/ASSISTANCE Description: STG Manage Bowel with Medication with min Assistance. Outcome: Completed/Met   Problem: RH SKIN INTEGRITY Goal: RH STG SKIN FREE OF INFECTION/BREAKDOWN Description: Skin to remain free from infection and breakdown with min assist while on rehab. Outcome: Completed/Met Goal: RH STG MAINTAIN SKIN INTEGRITY WITH ASSISTANCE Description: STG Maintain Skin Integrity With min Assistance. Outcome: Completed/Met Goal: RH STG ABLE TO PERFORM INCISION/WOUND CARE W/ASSISTANCE Description: STG Able To Perform Incision/Wound Care With min Assistance. Outcome: Completed/Met   Problem: RH SAFETY Goal: RH STG ADHERE TO SAFETY PRECAUTIONS W/ASSISTANCE/DEVICE Description: STG Adhere to Safety Precautions With mod I Assistance and appropriate assistive Device. Outcome: Completed/Met   Problem: RH COGNITION-NURSING Goal: RH STG USES MEMORY AIDS/STRATEGIES W/ASSIST TO PROBLEM SOLVE Description: STG Uses Memory Aids/Strategies With cues and  reminder Assistance to Problem Solve. Outcome: Completed/Met Goal: RH STG ANTICIPATES NEEDS/CALLS FOR ASSIST W/ASSIST/CUES Description: STG Anticipates Needs/Calls for Assist With Cues and reminders. Outcome: Completed/Met   Problem: RH PAIN MANAGEMENT Goal: RH STG PAIN MANAGED AT OR BELOW PT'S PAIN GOAL Description: <3 on a 0-10 pain scale. Outcome: Completed/Met   Problem: RH KNOWLEDGE DEFICIT BRAIN INJURY Goal: RH STG INCREASE KNOWLEDGE OF SELF CARE AFTER BRAIN INJURY Description: Patient and caregiver will demonstrate knowledge of medication management, wound care management, dietary restrictions, and follow up care with the MD post discharge with min assist from CIR staff. Outcome: Completed/Met

## 2019-08-17 NOTE — Progress Notes (Signed)
Occupational Therapy Note  Patient Details  Name: Dwayne Bulkley MRN: [id number] Date of Birth: [birth year]  Bivalve cast placed on Rt LE with pt tolerating well.  It appears to fit well.  Wife present.  Instructed her how to apply cast, and discussed need to monitor pt's tolerance for the Bivalve before establishing wear schedule.  She and pt were able to verbalize understanding.  Will check back and monitor cast for signs of pressure.  [name]., OTR/L Acute Rehabilitation Services Pager [phone] Office [phone]       [name] 08/17/2019, 4:39 PM

## 2019-08-17 NOTE — Progress Notes (Signed)
Occupational Therapy Note  Patient Details  Name: Chase Caldwell MRN: [id number] Date of Birth: 19-May-1980  Checked cast.  Pt reports it feels great.  No evidence of pressure noted.  Instructed pt and wife to start with up to 4 hour wear schedule and work up to 8 hours if he is able to tolerate it. She verbalized understanding, and is aware of need to monitor for signs of pressure.   [name]., OTR/L Acute Rehabilitation Services Pager [phone] Office [phone]      [name] 08/17/2019, 4:43 PM

## 2019-08-17 NOTE — Progress Notes (Signed)
Patient discharged home via ambulance about 1200. Patient alert and oriented. Patient denies any pain. Wife given discharge packet. No additional questions. Wife reports all medical equipment at the house. Patient discharged with all belongings.

## 2019-08-18 ENCOUNTER — Other Ambulatory Visit: Payer: Self-pay | Admitting: Physical Medicine and Rehabilitation

## 2019-08-19 NOTE — Telephone Encounter (Signed)
Refill request for sertraline. Patient discharged 8/7. No discharge note as of yet. Please advise

## 2019-08-20 ENCOUNTER — Telehealth: Payer: Self-pay

## 2019-08-20 NOTE — Telephone Encounter (Addendum)
Chase Caldwell given a verbal okay for Chase Caldwell -OT 2  times a week for 3 weeks , then 1 times a week for 3 weeks. Home Health 2 times a week for 3 weeks.  Ok granted to start.

## 2019-08-20 NOTE — Progress Notes (Signed)
Patient had all meds filled post discharged. Discussed with wife that refills on meds per PCP/psychiatry.

## 2019-08-21 ENCOUNTER — Telehealth: Payer: Self-pay | Admitting: *Deleted

## 2019-08-21 NOTE — Telephone Encounter (Signed)
[facility] called for POC 1wk1,2wk2,1wk2,for dysarthria.  Approval given.

## 2019-08-22 ENCOUNTER — Encounter (HOSPITAL_COMMUNITY): Payer: Self-pay | Admitting: Physical Medicine & Rehabilitation

## 2019-09-08 ENCOUNTER — Other Ambulatory Visit: Payer: Self-pay | Admitting: Physical Medicine and Rehabilitation

## 2019-09-18 ENCOUNTER — Encounter: Payer: Medicaid Other | Attending: Physical Medicine & Rehabilitation | Admitting: Physical Medicine & Rehabilitation

## 2019-09-18 ENCOUNTER — Other Ambulatory Visit: Payer: Self-pay

## 2019-09-18 ENCOUNTER — Encounter: Payer: Self-pay | Admitting: Physical Medicine & Rehabilitation

## 2019-09-18 VITALS — BP 125/78 | HR 79 | Temp 97.7°F

## 2019-09-18 DIAGNOSIS — G931 Anoxic brain damage, not elsewhere classified: Secondary | ICD-10-CM | POA: Diagnosis not present

## 2019-09-18 DIAGNOSIS — R471 Dysarthria and anarthria: Secondary | ICD-10-CM

## 2019-09-18 DIAGNOSIS — G8250 Quadriplegia, unspecified: Secondary | ICD-10-CM

## 2019-09-18 MED ORDER — BACLOFEN 20 MG PO TABS: 20.0000 mg | ORAL_TABLET | Freq: Three times a day (TID) | ORAL | 5 refills | Status: DC

## 2019-09-18 NOTE — Patient Instructions (Signed)
PLEASE FEEL FREE TO CALL OUR OFFICE WITH ANY PROBLEMS OR QUESTIONS ([phone])      

## 2019-09-18 NOTE — Progress Notes (Signed)
Subjective:    Patient ID: Chase Caldwell, male    DOB: Mar 16, 1980, 39 y.o.   MRN: [id number]  HPI   Chase Caldwell is here for Botox injections today.  I last saw him in the hospital.  His pain and spasticity levels have been better but spasticity continues to be a major problem.  His biggest complaint regarding his spasticity today is in the left upper extremity.  He also has pain in his left shoulder due to continued movements in the left arm.  Right wrist is also tight as is the right ankle.  Continues to work with home therapies but is anxious to move to outpatient rehab.  Home therapies has been working on transfers and mobility and is more easily able to get in and out of the car and for appointments now.  Mood is generally been more upbeat.  He has been alert.  He is doing well on regular food.  Wife is present today and remains very supportive.  Pain Inventory Average Pain 1 Pain Right Now 5 My pain is constant, sharp and aching  LOCATION OF PAIN left shoulder  BOWEL Number of stools per week:   Oral laxative use No  Type of laxative   Enema or suppository use No  History of colostomy No  Incontinent No   BLADDER Normal In and out cath, frequency   Able to self cath No  Bladder incontinence No  Frequent urination No  Leakage with coughing No  Difficulty starting stream No  Incomplete bladder emptying No    Mobility ability to climb steps?  no do you drive?  no use a wheelchair needs help with transfers  Function disabled: date disabled . I need assistance with the following:  feeding, dressing, bathing, toileting, meal prep, household duties and shopping  Neuro/Psych weakness numbness tremor tingling trouble walking spasms depression anxiety  Prior Studies HFU  Physicians involved in your care HFU   Family History  Problem Relation Age of Onset  . Depression Mother   . Cancer - Prostate Father    Social History   Socioeconomic History  . Marital  status: Married    Spouse name: Not on file  . Number of children: 2  . Years of education: Not on file  . Highest education level: Not on file  Occupational History  . Not on file  Tobacco Use  . Smoking status: Current Some Day Smoker    Packs/day: 0.25    Years: 15.00    Pack years: 3.75  . Smokeless tobacco: Never Used  . Tobacco comment: To spouse  Substance and Sexual Activity  . Alcohol use: Not on file  . Drug use: Not on file  . Sexual activity: Yes    Partners: Female  Other Topics Concern  . Not on file  Social History Narrative   ** Merged History Encounter **       Social Determinants of Health   Financial Resource Strain:   . Difficulty of Paying Living Expenses: Not on file  Food Insecurity:   . Worried About Charity fundraiser in the Last Year: Not on file  . Ran Out of Food in the Last Year: Not on file  Transportation Needs:   . Lack of Transportation (Medical): Not on file  . Lack of Transportation (Non-Medical): Not on file  Physical Activity:   . Days of Exercise per Week: Not on file  . Minutes of Exercise per Session: Not on file  Stress:   .  Feeling of Stress : Not on file  Social Connections:   . Frequency of Communication with Friends and Family: Not on file  . Frequency of Social Gatherings with Friends and Family: Not on file  . Attends Religious Services: Not on file  . Active Member of Clubs or Organizations: Not on file  . Attends Archivist Meetings: Not on file  . Marital Status: Not on file   Past Surgical History:  Procedure Laterality Date  . DEBRIDEMENT AND CLOSURE WOUND N/A 06/09/2019   Procedure: Washout and debridement of submental soft tissue and forehead soft tissue injuries; Complex closure of submental region soft tissue injury;  Complex closure of the forehead soft tissue injury; Closed reduction of nasal fracture with replacement of merocel packings;  Surgeon: Ronal Fear, MD;  Location: [place];   Service: Plastics;  Laterality: N/A;  . ESOPHAGOGASTRODUODENOSCOPY  06/17/2019   Procedure: Esophagogastroduodenoscopy (Egd);  Surgeon: Jesusita Oka, MD;  Location: [place];  Service: General;;  . IR [place] Vivianne Master  07/11/2019  . IR REPLC GASTRO/COLONIC TUBE PERCUT W/FLUORO  07/22/2019  . ORIF ZYGOMATIC FRACTURE N/A 06/17/2019   Procedure: OPEN RECONSTRUCTION FRONTAL SINUS, RIGHT MIDFACE RECONSTRUCTION, RESUSPENSION MEDIAL CANTHUS;  Surgeon: Ronal Fear, MD;  Location: [place];  Service: Plastics;  Laterality: N/A;  . PEG PLACEMENT N/A 06/17/2019   Procedure: PERCUTANEOUS ENDOSCOPIC GASTROSTOMY (PEG) PLACEMENT;  Surgeon: Jesusita Oka, MD;  Location: [facility] [place];  Service: General;  Laterality: N/A;  . RADIOLOGY WITH ANESTHESIA N/A 08/14/2019   Procedure: MRI WITH ANESTHESIA -BRAIN;  Surgeon: Radiologist, Medication, MD;  Location: [place];  Service: Radiology;  Laterality: N/A;  . TONSILLECTOMY    . TRACHEOSTOMY TUBE PLACEMENT N/A 06/07/2019   Procedure: TRACHEOSTOMY;  Surgeon: Jesusita Oka, MD;  Location: [facility];  Service: General;  Laterality: N/A;  . TRACHEOSTOMY TUBE PLACEMENT N/A 06/09/2019   Procedure: TRACHEOSTOMY REVISION;  Surgeon: [name], MD;  Location: [place];  Service: ENT;  Laterality: N/A;   Past Medical History:  Diagnosis Date  . Acute lower UTI   . Chronic right-sided low back pain with right-sided sciatica   . Major depressive disorder   . Obesity (BMI 30-39.9)    BP 125/78   Pulse 79   Temp 97.7 F (36.5 C)   SpO2 94%   Opioid Risk Score:   Fall Risk Score:  `1  Depression screen PHQ 2/9  Depression screen PHQ 2/9 09/18/2019  Decreased Interest 0  Down, Depressed, Hopeless 0  PHQ - 2 Score 0  Altered sleeping 1  Tired, decreased energy 0  Change in appetite 0  Feeling bad or failure about yourself  0  Trouble concentrating 0  Moving slowly or fidgety/restless 0  Suicidal thoughts 0  PHQ-9 Score 1  Difficult doing work/chores  Somewhat difficult     Review of Systems  Musculoskeletal:       Spasms  Neurological: Positive for tremors, weakness and numbness.       Tingling  Psychiatric/Behavioral: Positive for dysphoric mood. The patient is nervous/anxious.   All other systems reviewed and are negative.      Objective:   Physical Exam  Patient with ongoing dyskinesias especially in the left upper extremity.  Left shoulder without substantial tone.  He did have fluctuating tone in the left biceps.  Arm is significantly pronated in the wrist is extremely tight in flexion.  I have grade tone a 3-4 out of 4 currently.  Right  upper extremity with significant flexor tone at the wrist as well but only 2 out of 4 without excessive pronation or tone at the biceps.  Right ankle was tight.  I was able to range it to -10 from neutral.  Is 3 out of 4 there as well but his biggest problem is the heel cord contracture at present.  Left left lower extremity did not have significant tone today.  Overall Jaelynn speech is more clear and easily understood.  He still is dysarthric and has some issues with phonation and intonations.     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 This includes bleeding, bruising, infection, excessive weakness, or medication side effects. A REMS form is on file and signed.  Needle: 103mm injectable monopolar needle electrode    Number of units per muscle Pectoralis Major 0 units Pectoralis Minor 0 units Biceps 0 units Brachioradialis 0 units FCR 100 units left and 75 u right FCU 100 units left and 75u right FDS 25 units FDP 25 units FPL 0 units Palmaris Longus 0 units Pronator Teres 100 units Pronator Quadratus 0 units Lumbricals 0 units All injections were done after obtaining appropriate EMG activity and after negative drawback for blood. The patient tolerated the procedure well. Post procedure instructions were given. Return in about 2 months (around 11/18/2019).   Made referrals to outpatient PT OT and speech at [facility] neuro rehab.  Also made referral to [facility] for follow-up regarding his anoxic brain injury and associated caudate/putamen bilateral infarcts.  Refilled baclofen today also

## 2019-10-04 ENCOUNTER — Ambulatory Visit: Payer: Medicaid Other

## 2019-10-09 ENCOUNTER — Telehealth: Payer: Self-pay | Admitting: Physical Medicine & Rehabilitation

## 2019-10-09 DIAGNOSIS — A499 Bacterial infection, unspecified: Secondary | ICD-10-CM

## 2019-10-09 NOTE — Telephone Encounter (Signed)
Spoke with patients wife. Orders are in blue folder at front desk for patient to pick up and take to Lab.

## 2019-10-10 ENCOUNTER — Other Ambulatory Visit: Payer: Self-pay

## 2019-10-10 ENCOUNTER — Ambulatory Visit: Payer: Medicaid Other | Attending: Neurology

## 2019-10-10 DIAGNOSIS — R293 Abnormal posture: Secondary | ICD-10-CM

## 2019-10-10 DIAGNOSIS — M6281 Muscle weakness (generalized): Secondary | ICD-10-CM

## 2019-10-10 DIAGNOSIS — R2689 Other abnormalities of gait and mobility: Secondary | ICD-10-CM | POA: Diagnosis present
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
## 2019-10-25 ENCOUNTER — Telehealth: Payer: Self-pay | Admitting: Registered Nurse

## 2019-10-25 MED ORDER — HYDROCODONE-ACETAMINOPHEN 5-325 MG PO TABS: 1.0000 | ORAL_TABLET | Freq: Four times a day (QID) | ORAL | 0 refills | Status: DC | PRN

## 2019-10-25 NOTE — Telephone Encounter (Signed)
Return Mrs. Chase Caldwell call, she is aware the e-scribe is down. Hydrocodone will be e-scribed when [employer] is working. She verbalizes understanding. Also reports Chase Caldwell has two tablets of Hydrocodone, he usually takes one tablet every other day.

## 2019-10-25 NOTE — Telephone Encounter (Signed)
Mrs Sumler returned your call.

## 2019-10-25 NOTE — Telephone Encounter (Signed)
Hydrocodone e-scribed, today. Placed a call to Ginette Pitman, no answer. Left message to return the call.

## 2019-10-28 ENCOUNTER — Telehealth: Payer: Self-pay | Admitting: Registered Nurse

## 2019-10-28 MED ORDER — HYDROCODONE-ACETAMINOPHEN 5-325 MG PO TABS: 1.0000 | ORAL_TABLET | Freq: Four times a day (QID) | ORAL | 0 refills | Status: DC | PRN

## 2019-10-28 NOTE — Telephone Encounter (Signed)
PMP was Reviewed. Placed a call to [name], Hydrocodone e-scribed, educated on the black box warning, she verbalizes understanding.

## 2019-10-29 ENCOUNTER — Other Ambulatory Visit: Payer: Self-pay

## 2019-10-29 ENCOUNTER — Ambulatory Visit: Payer: Medicaid Other | Attending: Neurology

## 2019-10-29 DIAGNOSIS — R2689 Other abnormalities of gait and mobility: Secondary | ICD-10-CM

## 2019-10-29 DIAGNOSIS — R293 Abnormal posture: Secondary | ICD-10-CM | POA: Diagnosis not present

## 2019-10-29 DIAGNOSIS — S069X2S Unspecified intracranial injury with loss of consciousness of 31 minutes to 59 minutes, sequela: Secondary | ICD-10-CM | POA: Diagnosis present

## 2019-10-29 DIAGNOSIS — M6281 Muscle weakness (generalized): Secondary | ICD-10-CM

## 2019-10-29 DIAGNOSIS — M6289 Other specified disorders of muscle: Secondary | ICD-10-CM | POA: Diagnosis present
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
## 2019-10-30 ENCOUNTER — Other Ambulatory Visit (HOSPITAL_COMMUNITY): Payer: Self-pay | Admitting: Physical Medicine & Rehabilitation

## 2019-10-30 MED ORDER — NITROFURANTOIN MONOHYD MACRO 100 MG PO CAPS
100.0000 mg | ORAL_CAPSULE | Freq: Two times a day (BID) | ORAL | 0 refills | Status: AC
Start: 2019-10-30 — End: 2019-11-06

## 2019-11-08 ENCOUNTER — Ambulatory Visit: Payer: Medicaid Other

## 2019-11-12 ENCOUNTER — Ambulatory Visit: Payer: Medicaid Other

## 2019-11-13 ENCOUNTER — Ambulatory Visit: Payer: 59 | Attending: Neurology | Admitting: Occupational Therapy

## 2019-11-13 ENCOUNTER — Other Ambulatory Visit: Payer: Self-pay

## 2019-11-13 ENCOUNTER — Ambulatory Visit: Payer: 59

## 2019-11-13 ENCOUNTER — Encounter: Payer: Self-pay | Admitting: Occupational Therapy

## 2019-11-13 DIAGNOSIS — M25611 Stiffness of right shoulder, not elsewhere classified: Secondary | ICD-10-CM | POA: Insufficient documentation

## 2019-11-13 DIAGNOSIS — R293 Abnormal posture: Secondary | ICD-10-CM

## 2019-11-13 DIAGNOSIS — M25632 Stiffness of left wrist, not elsewhere classified: Secondary | ICD-10-CM

## 2019-11-13 DIAGNOSIS — M6281 Muscle weakness (generalized): Secondary | ICD-10-CM

## 2019-11-13 DIAGNOSIS — R2689 Other abnormalities of gait and mobility: Secondary | ICD-10-CM | POA: Diagnosis present

## 2019-11-13 DIAGNOSIS — R41842 Visuospatial deficit: Secondary | ICD-10-CM | POA: Insufficient documentation

## 2019-11-13 DIAGNOSIS — M25612 Stiffness of left shoulder, not elsewhere classified: Secondary | ICD-10-CM | POA: Diagnosis present

## 2019-11-13 DIAGNOSIS — R41840 Attention and concentration deficit: Secondary | ICD-10-CM | POA: Diagnosis present

## 2019-11-13 DIAGNOSIS — R208 Other disturbances of skin sensation: Secondary | ICD-10-CM | POA: Diagnosis present

## 2019-11-13 DIAGNOSIS — M6289 Other specified disorders of muscle: Secondary | ICD-10-CM | POA: Diagnosis present
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
## 2019-11-18 ENCOUNTER — Encounter: Payer: Self-pay | Admitting: Physical Therapy

## 2019-11-18 ENCOUNTER — Ambulatory Visit: Payer: 59 | Admitting: Physical Therapy

## 2019-11-18 ENCOUNTER — Other Ambulatory Visit: Payer: Self-pay

## 2019-11-18 DIAGNOSIS — R2689 Other abnormalities of gait and mobility: Secondary | ICD-10-CM

## 2019-11-18 DIAGNOSIS — M6289 Other specified disorders of muscle: Secondary | ICD-10-CM

## 2019-11-18 DIAGNOSIS — M6281 Muscle weakness (generalized): Secondary | ICD-10-CM

## 2019-11-18 DIAGNOSIS — R293 Abnormal posture: Secondary | ICD-10-CM | POA: Diagnosis not present
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
## 2019-11-19 ENCOUNTER — Ambulatory Visit: Payer: Medicaid Other | Admitting: Neurology

## 2019-11-19 ENCOUNTER — Encounter: Payer: Self-pay | Admitting: Neurology

## 2019-11-19 VITALS — BP 115/73 | HR 78

## 2019-11-19 DIAGNOSIS — S069X2S Unspecified intracranial injury with loss of consciousness of 31 minutes to 59 minutes, sequela: Secondary | ICD-10-CM

## 2019-11-19 DIAGNOSIS — G8250 Quadriplegia, unspecified: Secondary | ICD-10-CM | POA: Diagnosis not present

## 2019-11-19 DIAGNOSIS — G931 Anoxic brain damage, not elsewhere classified: Secondary | ICD-10-CM
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
## 2019-11-20 ENCOUNTER — Encounter: Payer: Medicaid Other | Admitting: Physical Medicine & Rehabilitation

## 2019-11-21 ENCOUNTER — Ambulatory Visit: Payer: 59

## 2019-11-25 ENCOUNTER — Ambulatory Visit: Payer: 59 | Admitting: Physical Therapy

## 2019-11-26 ENCOUNTER — Ambulatory Visit: Payer: 59 | Admitting: Occupational Therapy

## 2019-11-26 ENCOUNTER — Encounter: Payer: Medicaid Other | Admitting: Occupational Therapy

## 2019-11-27 ENCOUNTER — Other Ambulatory Visit: Payer: Self-pay

## 2019-11-27 ENCOUNTER — Ambulatory Visit: Payer: 59

## 2019-11-27 DIAGNOSIS — M6289 Other specified disorders of muscle: Secondary | ICD-10-CM

## 2019-11-27 DIAGNOSIS — R2689 Other abnormalities of gait and mobility: Secondary | ICD-10-CM

## 2019-11-27 DIAGNOSIS — M6281 Muscle weakness (generalized): Secondary | ICD-10-CM

## 2019-11-27 DIAGNOSIS — R293 Abnormal posture: Secondary | ICD-10-CM | POA: Diagnosis not present
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
## 2019-12-02 ENCOUNTER — Ambulatory Visit: Payer: 59 | Admitting: Physical Therapy

## 2019-12-03 ENCOUNTER — Encounter: Payer: Medicaid Other | Admitting: Occupational Therapy

## 2019-12-04 ENCOUNTER — Ambulatory Visit: Payer: Medicaid Other

## 2019-12-09 ENCOUNTER — Ambulatory Visit: Payer: 59 | Admitting: Physical Therapy

## 2019-12-10 ENCOUNTER — Encounter: Payer: Medicaid Other | Admitting: Occupational Therapy

## 2019-12-11 ENCOUNTER — Ambulatory Visit: Payer: Medicaid Other

## 2019-12-12 ENCOUNTER — Ambulatory Visit: Payer: Medicaid Other

## 2019-12-12 ENCOUNTER — Encounter: Payer: Medicaid Other | Admitting: Occupational Therapy

## 2019-12-17 ENCOUNTER — Encounter: Payer: Medicaid Other | Admitting: Occupational Therapy

## 2019-12-17 ENCOUNTER — Ambulatory Visit: Payer: Medicaid Other

## 2019-12-18 ENCOUNTER — Encounter: Payer: 59 | Attending: Physical Medicine & Rehabilitation | Admitting: Physical Medicine & Rehabilitation

## 2019-12-18 ENCOUNTER — Other Ambulatory Visit: Payer: Self-pay

## 2019-12-18 ENCOUNTER — Encounter: Payer: Self-pay | Admitting: Physical Medicine & Rehabilitation

## 2019-12-18 VITALS — BP 115/77 | HR 72 | Temp 98.0°F

## 2019-12-18 DIAGNOSIS — G8250 Quadriplegia, unspecified: Secondary | ICD-10-CM | POA: Diagnosis present

## 2019-12-18 DIAGNOSIS — S069X3S Unspecified intracranial injury with loss of consciousness of 1 hour to 5 hours 59 minutes, sequela: Secondary | ICD-10-CM | POA: Insufficient documentation

## 2019-12-18 MED ORDER — HYDROCODONE-ACETAMINOPHEN 5-325 MG PO TABS: 1.0000 | ORAL_TABLET | Freq: Every day | ORAL | 0 refills | Status: DC | PRN

## 2019-12-18 MED ORDER — CLONAZEPAM 1 MG PO TABS: 1.0000 mg | ORAL_TABLET | Freq: Four times a day (QID) | ORAL | 1 refills | Status: DC

## 2019-12-18 MED ORDER — BACLOFEN 20 MG PO TABS: 20.0000 mg | ORAL_TABLET | Freq: Three times a day (TID) | ORAL | 5 refills | Status: DC

## 2019-12-18 MED ORDER — TIZANIDINE HCL 2 MG PO CAPS: 2.0000 mg | ORAL_CAPSULE | Freq: Three times a day (TID) | ORAL | 5 refills | Status: DC
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
## 2019-12-18 NOTE — Patient Instructions (Addendum)
TIZANIDINE (NEW SPASM MED) 2MG  AT BED FOR 2 DAYS THEN 2MG  TWICE DAILY FOR 2 DAYS, THEN THREE X DAILY   PLEASE FEEL FREE TO CALL OUR OFFICE WITH ANY PROBLEMS OR QUESTIONS ([phone])                                @                 @@               @@@                        @@@@                      @@@@@         @@@@@@                  @@@@@@@                @@@@@@@@              @@@@@@@@@             @@@@@@@@@@       IIII                  IIII                                                        HAPPY HOLIDAYS!!!!!

## 2019-12-19 ENCOUNTER — Encounter: Payer: Medicaid Other | Admitting: Occupational Therapy

## 2019-12-19 ENCOUNTER — Telehealth: Payer: Self-pay

## 2019-12-19 ENCOUNTER — Ambulatory Visit: Payer: Medicaid Other | Attending: Neurology

## 2019-12-19 NOTE — Telephone Encounter (Signed)
PT called to check on pt as did not show for visit again today. Spoke with wife, Starla Link, who reported that transportation cancelled again on them saying the [facility] broke down. They are set up for next Tuesday as long as medicaid transportation does not cancel again. Cherly Anderson, PT, DPT, NCS

## 2019-12-24 ENCOUNTER — Encounter: Payer: Medicaid Other | Admitting: Occupational Therapy

## 2019-12-24 ENCOUNTER — Ambulatory Visit: Payer: Medicaid Other

## 2019-12-24 MED ORDER — DANTROLENE SODIUM 25 MG PO CAPS
25.0000 mg | ORAL_CAPSULE | Freq: Three times a day (TID) | ORAL | 4 refills | Status: DC
Start: 2019-12-24 — End: 2020-02-05

## 2019-12-24 NOTE — Telephone Encounter (Signed)
Tizanidine stopped d/t emotional lability. New rx for dantrium written and sent to pharmacy

## 2019-12-26 ENCOUNTER — Ambulatory Visit: Payer: Medicaid Other | Admitting: Physical Therapy

## 2019-12-26 ENCOUNTER — Encounter: Payer: Medicaid Other | Admitting: Occupational Therapy

## 2019-12-30 ENCOUNTER — Encounter: Payer: Medicaid Other | Admitting: Occupational Therapy

## 2019-12-30 ENCOUNTER — Ambulatory Visit: Payer: Medicaid Other | Admitting: Physical Therapy

## 2020-01-01 ENCOUNTER — Encounter: Payer: Medicaid Other | Admitting: Occupational Therapy

## 2020-01-01 ENCOUNTER — Ambulatory Visit: Payer: Medicaid Other

## 2020-01-07 ENCOUNTER — Ambulatory Visit: Payer: Medicaid Other

## 2020-01-07 ENCOUNTER — Encounter: Payer: Medicaid Other | Admitting: Occupational Therapy

## 2020-01-09 ENCOUNTER — Encounter: Payer: Medicaid Other | Admitting: Occupational Therapy

## 2020-01-09 ENCOUNTER — Ambulatory Visit: Payer: Medicaid Other

## 2020-02-04 DIAGNOSIS — Z0271 Encounter for disability determination: Secondary | ICD-10-CM

## 2020-02-05 ENCOUNTER — Encounter: Payer: Self-pay | Admitting: Physical Medicine & Rehabilitation

## 2020-02-05 ENCOUNTER — Encounter: Payer: 59 | Attending: Physical Medicine & Rehabilitation | Admitting: Physical Medicine & Rehabilitation

## 2020-02-05 ENCOUNTER — Other Ambulatory Visit: Payer: Self-pay

## 2020-02-05 VITALS — BP 112/76 | HR 56 | Temp 98.2°F | Ht 72.0 in

## 2020-02-05 DIAGNOSIS — G8250 Quadriplegia, unspecified: Secondary | ICD-10-CM | POA: Insufficient documentation

## 2020-02-05 MED ORDER — DANTROLENE SODIUM 25 MG PO CAPS: 25.0000 mg | ORAL_CAPSULE | Freq: Four times a day (QID) | ORAL | 4 refills | Status: DC

## 2020-02-05 NOTE — Patient Instructions (Signed)
STRETCHING SEQUENCE FOR LEFT ARM  1.FIRST BICEPS STRETCH 2.THEN EXTEND WRIST AND FINGERS 3.THEN FINALLY TRY ROTATE FOREARM AROUND INTO THE SUPINE POSITION.

## 2020-02-05 NOTE — Progress Notes (Signed)
Botox Injection for spasticity of upper extremity using needle EMG guidance Indication: 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HCC)   Dilution: 100 Units/ml        Total Units Injected: 600 Indication: Severe spasticity which interferes with ADL,mobility and/or  hygiene and is unresponsive to medication management and other conservative care Informed consent was obtained after describing risks and benefits of the procedure with the patient. This includes bleeding, bruising, infection, excessive weakness, or medication side effects. A REMS form is on file and signed.  Needle: 42mm injectable monopolar needle electrode    Number of units per muscle Pectoralis Major 0 units Pectoralis Minor 0 units Biceps 125 units Brachioradialis 75 units FCR 25 units FCU 25 units FDS 100 units FDP 100 units FPL 0 units Palmaris Longus 25 units Pronator Teres 100 units Pronator Quadratus 25 units Lumbricals 0 units All injections were done after obtaining appropriate EMG activity and after negative drawback for blood. The patient tolerated the procedure well. Post procedure instructions were given. Return in about 2 months (around 04/04/2020).

## 2020-02-20 ENCOUNTER — Other Ambulatory Visit: Payer: Self-pay | Admitting: Physical Medicine & Rehabilitation

## 2020-02-20 DIAGNOSIS — G8250 Quadriplegia, unspecified: Secondary | ICD-10-CM

## 2020-04-08 ENCOUNTER — Encounter: Payer: Self-pay | Admitting: Physical Medicine & Rehabilitation

## 2020-04-08 ENCOUNTER — Encounter (HOSPITAL_COMMUNITY): Payer: Self-pay | Admitting: Physical Medicine & Rehabilitation

## 2020-04-08 ENCOUNTER — Encounter: Payer: Medicaid Other | Attending: Physical Medicine & Rehabilitation | Admitting: Physical Medicine & Rehabilitation

## 2020-04-08 ENCOUNTER — Other Ambulatory Visit: Payer: Self-pay

## 2020-04-08 VITALS — HR 71 | Temp 97.7°F | Ht 76.0 in | Wt 257.0 lb

## 2020-04-08 DIAGNOSIS — G931 Anoxic brain damage, not elsewhere classified: Secondary | ICD-10-CM | POA: Diagnosis not present

## 2020-04-08 DIAGNOSIS — G8250 Quadriplegia, unspecified: Secondary | ICD-10-CM | POA: Diagnosis not present

## 2020-04-08 MED ORDER — DANTROLENE SODIUM 50 MG PO CAPS: 50.0000 mg | ORAL_CAPSULE | Freq: Four times a day (QID) | ORAL | 4 refills | Status: DC
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
## 2020-04-08 NOTE — Patient Instructions (Addendum)
PLEASE FEEL FREE TO CALL OUR OFFICE WITH ANY PROBLEMS OR QUESTIONS ([phone])   DANTRIUM: DAYS 1-4: 25-25-25-50MG  DAYS 5-7: 50-25-25-50MG  DAYS 8-10: 50-50-25-50MG  DAYS 11+ 50MG  4X DAILY

## 2020-04-09 ENCOUNTER — Other Ambulatory Visit: Payer: Self-pay | Admitting: *Deleted

## 2020-04-22 ENCOUNTER — Other Ambulatory Visit: Payer: Self-pay | Admitting: Physical Medicine & Rehabilitation

## 2020-04-22 DIAGNOSIS — G8250 Quadriplegia, unspecified: Secondary | ICD-10-CM

## 2020-04-24 ENCOUNTER — Emergency Department (HOSPITAL_COMMUNITY): Payer: Medicaid Other

## 2020-04-24 ENCOUNTER — Other Ambulatory Visit: Payer: Self-pay

## 2020-04-24 ENCOUNTER — Emergency Department (HOSPITAL_COMMUNITY)
Admission: EM | Admit: 2020-04-24 | Discharge: 2020-04-30 | Disposition: A | Discharge status: Home / Self Care | Payer: Medicaid Other | Attending: Emergency Medicine | Admitting: Emergency Medicine

## 2020-04-24 ENCOUNTER — Encounter (HOSPITAL_COMMUNITY): Payer: Self-pay

## 2020-04-24 DIAGNOSIS — F17210 Nicotine dependence, cigarettes, uncomplicated: Secondary | ICD-10-CM | POA: Insufficient documentation

## 2020-04-24 DIAGNOSIS — R059 Cough, unspecified: Secondary | ICD-10-CM | POA: Diagnosis not present

## 2020-04-24 DIAGNOSIS — R45851 Suicidal ideations: Secondary | ICD-10-CM | POA: Diagnosis not present

## 2020-04-24 DIAGNOSIS — S069X9A Unspecified intracranial injury with loss of consciousness of unspecified duration, initial encounter: Secondary | ICD-10-CM | POA: Diagnosis present

## 2020-04-24 DIAGNOSIS — S062X9S Diffuse traumatic brain injury with loss of consciousness of unspecified duration, sequela: Secondary | ICD-10-CM | POA: Insufficient documentation

## 2020-04-24 DIAGNOSIS — F332 Major depressive disorder, recurrent severe without psychotic features: Secondary | ICD-10-CM | POA: Diagnosis not present

## 2020-04-24 DIAGNOSIS — X749XXS Intentional self-harm by unspecified firearm discharge, sequela: Secondary | ICD-10-CM | POA: Diagnosis not present

## 2020-04-24 DIAGNOSIS — Z20822 Contact with and (suspected) exposure to covid-19: Secondary | ICD-10-CM | POA: Diagnosis not present

## 2020-04-24 DIAGNOSIS — G800 Spastic quadriplegic cerebral palsy: Secondary | ICD-10-CM | POA: Insufficient documentation

## 2020-04-24 DIAGNOSIS — R000 Tachycardia, unspecified: Secondary | ICD-10-CM | POA: Diagnosis not present

## 2020-04-24 DIAGNOSIS — S0990XS Unspecified injury of head, sequela: Secondary | ICD-10-CM | POA: Diagnosis present

## 2020-04-24 DIAGNOSIS — S069X9S Unspecified intracranial injury with loss of consciousness of unspecified duration, sequela: Secondary | ICD-10-CM

## 2020-04-24 DIAGNOSIS — S069XAA Unspecified intracranial injury with loss of consciousness status unknown, initial encounter: Secondary | ICD-10-CM | POA: Diagnosis present

## 2020-04-24 LAB — CBC WITH DIFFERENTIAL/PLATELET
Abs Immature Granulocytes: 0.02 10*3/uL (ref 0.00–0.07)
Basophils Absolute: 0.1 10*3/uL (ref 0.0–0.1)
Basophils Relative: 1 %
Eosinophils Absolute: 0.1 10*3/uL (ref 0.0–0.5)
Eosinophils Relative: 1 %
HCT: 45.8 % (ref 39.0–52.0)
Hemoglobin: 15 g/dL (ref 13.0–17.0)
Immature Granulocytes: 0 %
Lymphocytes Relative: 22 %
Lymphs Abs: 2.2 10*3/uL (ref 0.7–4.0)
MCH: 29.2 pg (ref 26.0–34.0)
MCHC: 32.8 g/dL (ref 30.0–36.0)
MCV: 89.1 fL (ref 80.0–100.0)
Monocytes Absolute: 0.6 10*3/uL (ref 0.1–1.0)
Monocytes Relative: 6 %
Neutro Abs: 7 10*3/uL (ref 1.7–7.7)
Neutrophils Relative %: 70 %
Platelets: 308 10*3/uL (ref 150–400)
RBC: 5.14 MIL/uL (ref 4.22–5.81)
RDW: 14.9 % (ref 11.5–15.5)
WBC: 10 10*3/uL (ref 4.0–10.5)
nRBC: 0 % (ref 0.0–0.2)

## 2020-04-24 LAB — COMPREHENSIVE METABOLIC PANEL
ALT: 14 U/L (ref 0–44)
AST: 22 U/L (ref 15–41)
Albumin: 3.5 g/dL (ref 3.5–5.0)
Alkaline Phosphatase: 78 U/L (ref 38–126)
Anion gap: 6 (ref 5–15)
BUN: 13 mg/dL (ref 6–20)
CO2: 24 mmol/L (ref 22–32)
Calcium: 8.8 mg/dL — ABNORMAL LOW (ref 8.9–10.3)
Chloride: 109 mmol/L (ref 98–111)
Creatinine, Ser: 0.71 mg/dL (ref 0.61–1.24)
GFR, Estimated: >60 mL/min (ref >60)
Glucose, Bld: 92 mg/dL (ref 70–99)
Potassium: 4.8 mmol/L (ref 3.5–5.1)
Sodium: 139 mmol/L (ref 135–145)
Total Bilirubin: 1.6 mg/dL — ABNORMAL HIGH (ref 0.3–1.2)
Total Protein: 6.4 g/dL — ABNORMAL LOW (ref 6.5–8.1)

## 2020-04-24 LAB — ETHANOL: Alcohol, Ethyl (B): <10 mg/dL (ref <10)

## 2020-04-24 LAB — URINALYSIS, ROUTINE W REFLEX MICROSCOPIC
Bilirubin Urine: NEGATIVE
Glucose, UA: NEGATIVE mg/dL
Hgb urine dipstick: NEGATIVE
Ketones, ur: 5 mg/dL — AB
Nitrite: NEGATIVE
Protein, ur: NEGATIVE mg/dL
Specific Gravity, Urine: 1.026 (ref 1.005–1.030)
pH: 6 (ref 5.0–8.0)

## 2020-04-24 LAB — RAPID URINE DRUG SCREEN, HOSP PERFORMED
Amphetamines: NOT DETECTED
Barbiturates: NOT DETECTED
Benzodiazepines: POSITIVE — AB
Cocaine: NOT DETECTED
Opiates: NOT DETECTED
Tetrahydrocannabinol: POSITIVE — AB

## 2020-04-24 LAB — RESP PANEL BY RT-PCR (FLU A&B, COVID) ARPGX2
Influenza A by PCR: NEGATIVE
Influenza B by PCR: NEGATIVE
SARS Coronavirus 2 by RT PCR: NEGATIVE

## 2020-04-24 MED ORDER — LORAZEPAM 1 MG PO TABS
0.5000 mg | ORAL_TABLET | Freq: Two times a day (BID) | ORAL | Status: DC | PRN
  Administered 2020-04-25: 0.5 mg via ORAL
  Filled 2020-04-24: qty 1

## 2020-04-24 MED ORDER — LISINOPRIL 10 MG PO TABS
10.0000 mg | ORAL_TABLET | Freq: Every day | ORAL | Status: DC
  Administered 2020-04-24 – 2020-04-29 (×6): 10 mg via ORAL
  Filled 2020-04-24 (×6): qty 1

## 2020-04-24 MED ORDER — PANTOPRAZOLE SODIUM 40 MG PO TBEC
40.0000 mg | DELAYED_RELEASE_TABLET | Freq: Every day | ORAL | Status: DC
  Administered 2020-04-24 – 2020-04-29 (×5): 40 mg via ORAL
  Filled 2020-04-24 (×5): qty 1

## 2020-04-24 MED ORDER — SODIUM CHLORIDE 0.9 % IV BOLUS
500.0000 mL | Freq: Once | INTRAVENOUS | Status: AC
  Administered 2020-04-24: 500 mL via INTRAVENOUS

## 2020-04-24 MED ORDER — NICOTINE 21 MG/24HR TD PT24
21.0000 mg | MEDICATED_PATCH | Freq: Every day | TRANSDERMAL | Status: DC
  Administered 2020-04-24 – 2020-04-29 (×6): 21 mg via TRANSDERMAL
  Filled 2020-04-24 (×6): qty 1

## 2020-04-24 MED ORDER — ZOLPIDEM TARTRATE 5 MG PO TABS
5.0000 mg | ORAL_TABLET | Freq: Every evening | ORAL | Status: DC | PRN
  Administered 2020-04-26: 5 mg via ORAL
  Filled 2020-04-24: qty 1

## 2020-04-24 MED ORDER — PROPRANOLOL HCL 60 MG PO TABS
60.0000 mg | ORAL_TABLET | Freq: Three times a day (TID) | ORAL | Status: DC
  Administered 2020-04-25 – 2020-04-29 (×14): 60 mg via ORAL
  Filled 2020-04-24 (×16): qty 1

## 2020-04-24 MED ORDER — GABAPENTIN 250 MG/5ML PO SOLN
100.0000 mg | Freq: Every day | ORAL | Status: DC
  Administered 2020-04-25 – 2020-04-30 (×7): 100 mg via ORAL
  Filled 2020-04-24 (×8): qty 2

## 2020-04-24 MED ORDER — PROPRANOLOL HCL 1 MG/ML IV SOLN
2.0000 mg | Freq: Once | INTRAVENOUS | Status: DC
  Filled 2020-04-24: qty 2

## 2020-04-24 MED ORDER — ACETAMINOPHEN 325 MG PO TABS
650.0000 mg | ORAL_TABLET | ORAL | Status: DC | PRN
  Administered 2020-04-25 – 2020-04-29 (×2): 650 mg via ORAL
  Filled 2020-04-24 (×3): qty 2

## 2020-04-24 MED ORDER — BACLOFEN 20 MG PO TABS
20.0000 mg | ORAL_TABLET | Freq: Three times a day (TID) | ORAL | Status: DC
  Administered 2020-04-25 – 2020-04-29 (×15): 20 mg via ORAL
  Filled 2020-04-24 (×13): qty 1
  Filled 2020-04-24: qty 2
  Filled 2020-04-24 (×3): qty 1
  Filled 2020-04-24: qty 2
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
## 2020-04-24 NOTE — ED Notes (Signed)
Psych packet completed, items inventoried, belonings in locker 6

## 2020-04-24 NOTE — ED Notes (Signed)
Patient transported to X-ray 
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
## 2020-04-25 DIAGNOSIS — F332 Major depressive disorder, recurrent severe without psychotic features: Secondary | ICD-10-CM | POA: Diagnosis present

## 2020-04-25 MED ORDER — LITHIUM CARBONATE 150 MG PO CAPS
150.0000 mg | ORAL_CAPSULE | Freq: Two times a day (BID) | ORAL | Status: DC
Start: 2020-04-25 — End: 2020-04-30
  Administered 2020-04-25 – 2020-04-29 (×9): 150 mg via ORAL
  Filled 2020-04-25 (×12): qty 1

## 2020-04-25 NOTE — ED Notes (Signed)
Pt denies SI/HI and AV hallucinations at this time. Pt keeps on saying, "I want to go home." Pt frequently cries wanting to go home. Alert and oriented x 3. Pt is cooperative. Takes meds with no difficulty. Ate 20% of breakfast. Reports not feeling hungry. Pt keeps on asking for his wife. Pending psych to see pt at this time.

## 2020-04-25 NOTE — ED Notes (Signed)
Pt changed at 6:00. Pt incontinent

## 2020-04-25 NOTE — ED Notes (Signed)
Patient placed in gown and diaper due to incontinence.
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
## 2020-04-25 NOTE — ED Notes (Addendum)
Psych states plan is inpatient admission. No room assignment just yet.

## 2020-04-25 NOTE — ED Notes (Signed)
Pt occasionally crying wanting to go home. Currently denies SI/HI and AV hallucinations. TTS currently in process.

## 2020-04-26 LAB — URINE CULTURE: Culture: NO GROWTH

## 2020-04-26 NOTE — Progress Notes (Signed)
Per Chase Caldwell, patient meets criteria for inpatient treatment. There are no available or appropriate beds at [facility] today. CSW faxed referrals to the following facilities for review:  [place]  TTS will continue to seek bed placement.  Glennie Isle, MSW, [place], LCAS-A Phone: [phone] Disposition/TOC

## 2020-04-27 ENCOUNTER — Encounter (HOSPITAL_COMMUNITY): Payer: Self-pay | Admitting: Registered Nurse

## 2020-04-27 DIAGNOSIS — F332 Major depressive disorder, recurrent severe without psychotic features: Secondary | ICD-10-CM

## 2020-04-27 DIAGNOSIS — R45851 Suicidal ideations: Secondary | ICD-10-CM | POA: Diagnosis not present

## 2020-04-27 MED ORDER — DULOXETINE HCL 60 MG PO CPEP
60.0000 mg | ORAL_CAPSULE | Freq: Every day | ORAL | Status: DC
  Administered 2020-04-27 – 2020-04-29 (×3): 60 mg via ORAL
  Filled 2020-04-27 (×5): qty 1

## 2020-04-27 MED ORDER — QUETIAPINE FUMARATE 200 MG PO TABS
200.0000 mg | ORAL_TABLET | Freq: Every day | ORAL | Status: DC
  Administered 2020-04-27 – 2020-04-29 (×3): 200 mg via ORAL
  Filled 2020-04-27 (×3): qty 1

## 2020-04-27 MED ORDER — QUETIAPINE FUMARATE 50 MG PO TABS
50.0000 mg | ORAL_TABLET | Freq: Every day | ORAL | Status: DC
  Administered 2020-04-27 – 2020-04-29 (×3): 50 mg via ORAL
  Filled 2020-04-27 (×5): qty 1

## 2020-04-27 MED ORDER — SERTRALINE HCL 50 MG PO TABS
50.0000 mg | ORAL_TABLET | Freq: Every day | ORAL | Status: DC
  Administered 2020-04-27 – 2020-04-29 (×3): 50 mg via ORAL
  Filled 2020-04-27 (×5): qty 1

## 2020-04-27 NOTE — BH Assessment (Addendum)
Disposition: Provider Earleen Newport, NP, continues to recommend inpatient psychiatric treatment at this time. Disposition CSW/Counselor to seek an appropriate bed, with possible psychiatric clearance tomorrow if patient continues to do well and deny suicidal ideation. Today, Disposition CSW/Counselor faxed referrals to the following facilities for consideration of bed placement. Patient under review at the following facilities:   [facility] Details  [facility] Details  [facility] Details  [place] Details  [facility] Details  [facility] Details  [facility] Details [facility] Details  [facility] Details  [facility] Details  [facility] Details  [facility] Details  [facility] Details  [place] Details [facility] Details  [facility] Details  [place] Details  [facility] Details  [facility] Details [facility] Details  [facility] Details  [facility] Details  [facility] Details  [place] Details  [place]
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
## 2020-04-27 NOTE — ED Notes (Signed)
Assisted pt with oral intake for lunch. Tolerated well. 75% of food consumed. 100% of fluids consumed. Pt is now resting.

## 2020-04-27 NOTE — ED Notes (Signed)
Pts family updated per request

## 2020-04-27 NOTE — ED Notes (Signed)
Pending Inpt Psych Placement Placed Breakfast order

## 2020-04-27 NOTE — ED Notes (Signed)
Verbal report received from [place] at this time

## 2020-04-28 NOTE — ED Provider Notes (Signed)
Emergency Medicine Observation Re-evaluation Note  Chase Caldwell is a 40 y.o. male, seen on rounds today.  Pt initially presented to the ED for complaints of Suicidal Currently, the patient is resting in bed.  Physical Exam  BP 111/70   Pulse 66   Temp (!) 97.5 F (36.4 C) (Axillary)   Resp 20   Ht 6' (1.829 m)   Wt 88.9 kg   SpO2 93%   BMI 26.58 kg/m  Physical Exam General: resting in bed, no distress Cardiac: warm and well perfused Lungs: even unlabored3 Psych: calm  ED Course / MDM  EKG:EKG Interpretation  Date/Time:  Friday April 24 2020 18:06:59 EDT Ventricular Rate:  69 PR Interval:  166 QRS Duration: 112 QT Interval:  423 QTC Calculation: 295 R Axis:   83 Text Interpretation: Sinus rhythm Incomplete right bundle branch block Confirmed by Pattricia Boss [phone]) on 04/24/2020 7:41:04 PM Also confirmed by Pattricia Boss [phone]), editor Oriental, [place] [phone])  on 04/25/2020 9:18:55 AM   I have reviewed the labs performed to date as well as medications administered while in observation.  Recent changes in the last 24 hours include psych yesterday felt patient is potentially able to be psych cleared, will reassess today.  Plan  Current plan is for psych to reassess today and potentially psych clear.  Patient is not under full IVC at this time.   Lucrezia Starch, MD 04/28/20 [phone]

## 2020-04-28 NOTE — ED Notes (Signed)
Lithium and Seroquel requested from pharmacy

## 2020-04-28 NOTE — ED Notes (Signed)
Pt bed changed and peri care provided

## 2020-04-28 NOTE — Progress Notes (Signed)
CSW refaxed referrals at this time, see list. No facility has expressed interested in patient at this time. CSW will continue to monitor patient disposition.   Destination  Service Provider Address Phone Fax  [place]  [address] [place] [address] [place]  [facility]  [address] [place] [address] [phone]  [place], [place] [address] [phone] [facility]  [address] [place], [place] [address] [phone] [place] [place] [address] [place]  [facility]  [address] [place] [address] [phone]  [facility]  [address], [place] [address] [phone]  [place] [address] [place] [address] [phone]  [facility]  [address] [place] [address] [phone]  [facility]  [address] [place] [address] [phone] [facility]  [address], [place] [address] [phone] [place]  [address], [place] [address] [phone] [facility]  [address], [place] [address] [phone] [place]  [address], [place] [address] [phone]  [facility]  [place], [place] [address] [place]  [address] [place] [address] [phone]  [facility]  [address] [place] [address] [phone]   [facility]  [address] [place] [address] [phone]  [facility]  [address] [place] [address] [phone] [place]  [address], [place] [address] [phone] [facility]  [address], [place] [address] [phone]  [facility]  [address] [place] [address] [phone]   Signed:  Durenda Hurt, MSW, [place], [place] 04/28/2020 2:43 PM

## 2020-04-28 NOTE — ED Notes (Signed)
Offered to feed pt lunch. Pt refused
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
## 2020-04-29 NOTE — ED Notes (Signed)
Called [facility] for RN Hennie Duos at 16:14. Patient is twelfth in line.

## 2020-04-29 NOTE — ED Notes (Addendum)
Pt had a bowel movement and was cleaned up.

## 2020-04-29 NOTE — Social Work (Signed)
CSW called wife.  Pt is currently receiving all services that he is eligible for through Medicaid. CSW provided outpatient resource list for [facility] services for both [facility] as well as the [place] area after having discussion with wife.  Wife stated she will be perusing placement through PCP.  Wife inquired about psychotropic medications and CSW explained that she does not handle medication.

## 2020-04-29 NOTE — ED Notes (Signed)
Pt's wife called Ginette Pitman) and she agreed to let pt come back home. Pt requesting that pt be transported back home and states that someone will be home to receive pt.

## 2020-05-20 ENCOUNTER — Other Ambulatory Visit: Payer: Self-pay

## 2020-05-20 ENCOUNTER — Encounter
Payer: Managed Care, Other (non HMO) | Attending: Physical Medicine & Rehabilitation | Admitting: Physical Medicine & Rehabilitation

## 2020-05-20 ENCOUNTER — Encounter: Payer: Self-pay | Admitting: Physical Medicine & Rehabilitation

## 2020-05-20 VITALS — BP 100/72 | HR 72 | Temp 98.6°F

## 2020-05-20 DIAGNOSIS — Z9911 Dependence on respirator [ventilator] status: Secondary | ICD-10-CM | POA: Diagnosis present

## 2020-05-20 DIAGNOSIS — G8250 Quadriplegia, unspecified: Secondary | ICD-10-CM | POA: Diagnosis present

## 2020-05-20 DIAGNOSIS — K9423 Gastrostomy malfunction: Secondary | ICD-10-CM | POA: Diagnosis present

## 2020-05-20 NOTE — Progress Notes (Signed)
Botox Injection for spasticity of upper extremity using needle EMG guidance Indication: spastic tetraplegia g82.50  Dilution: 100 Units/ml        Total Units Injected: 600 Indication: Severe spasticity which interferes with ADL,mobility and/or  hygiene and is unresponsive to medication management and other conservative care Informed consent was obtained after describing risks and benefits of the procedure with the patient. This includes bleeding, bruising, infection, excessive weakness, or medication side effects. A REMS form is on file and signed.  Needle: 73mm injectable monopolar needle electrode  LUE  Number of units per muscle Pectoralis Major 0 units Pectoralis Minor 0 units Biceps 100 units Brachioradialis 100 units FCR 100 units FCU 100 units FDS 25 units FDP 25 units FPL 0 units Palmaris Longus 50 units Pronator Teres 100 units Pronator Quadratus 0 units Lumbricals 0 units All injections were done after obtaining appropriate EMG activity and after negative drawback for blood. The patient tolerated the procedure well. Post procedure instructions were given. Return in about 3 months (around 08/20/2020).

## 2020-05-20 NOTE — Patient Instructions (Signed)
PLEASE FEEL FREE TO CALL OUR OFFICE WITH ANY PROBLEMS OR QUESTIONS ([phone])      

## 2020-05-22 DIAGNOSIS — G931 Anoxic brain damage, not elsewhere classified: Secondary | ICD-10-CM

## 2020-07-07 ENCOUNTER — Other Ambulatory Visit: Payer: Self-pay | Admitting: Physical Medicine & Rehabilitation

## 2020-07-07 DIAGNOSIS — G8250 Quadriplegia, unspecified: Secondary | ICD-10-CM

## 2020-08-12 ENCOUNTER — Other Ambulatory Visit: Payer: Self-pay

## 2020-08-12 ENCOUNTER — Encounter
Payer: Managed Care, Other (non HMO) | Attending: Physical Medicine & Rehabilitation | Admitting: Physical Medicine & Rehabilitation

## 2020-08-12 ENCOUNTER — Encounter: Payer: Self-pay | Admitting: Physical Medicine & Rehabilitation

## 2020-08-12 VITALS — BP 101/66 | HR 72 | Temp 98.1°F | Ht 72.0 in

## 2020-08-12 DIAGNOSIS — G894 Chronic pain syndrome: Secondary | ICD-10-CM | POA: Diagnosis present

## 2020-08-12 DIAGNOSIS — G8250 Quadriplegia, unspecified: Secondary | ICD-10-CM | POA: Insufficient documentation

## 2020-08-12 DIAGNOSIS — G931 Anoxic brain damage, not elsewhere classified: Secondary | ICD-10-CM | POA: Insufficient documentation

## 2020-08-12 NOTE — Patient Instructions (Signed)
PLEASE FEEL FREE TO CALL OUR OFFICE WITH ANY PROBLEMS OR QUESTIONS ([phone])      
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
## 2020-08-19 ENCOUNTER — Ambulatory Visit: Payer: Managed Care, Other (non HMO) | Admitting: Physical Medicine & Rehabilitation

## 2020-09-12 ENCOUNTER — Other Ambulatory Visit: Payer: Self-pay | Admitting: Physical Medicine & Rehabilitation

## 2020-09-12 DIAGNOSIS — G8250 Quadriplegia, unspecified: Secondary | ICD-10-CM

## 2020-10-07 ENCOUNTER — Other Ambulatory Visit: Payer: Self-pay | Admitting: Physical Medicine & Rehabilitation

## 2020-10-07 DIAGNOSIS — G8250 Quadriplegia, unspecified: Secondary | ICD-10-CM

## 2020-10-07 DIAGNOSIS — S069X3S Unspecified intracranial injury with loss of consciousness of 1 hour to 5 hours 59 minutes, sequela: Secondary | ICD-10-CM

## 2020-10-12 IMAGING — XA IR REPLACE G-TUBE/COLONIC TUBE
1 series · 1 of 1 positions shown · non-contrast
Comparison: none

INDICATION: 39-year-old with a dislodged gastrostomy tube.

[Series 1: fl (-) angio · 1 of 1 slices shown]
[im 1/1]
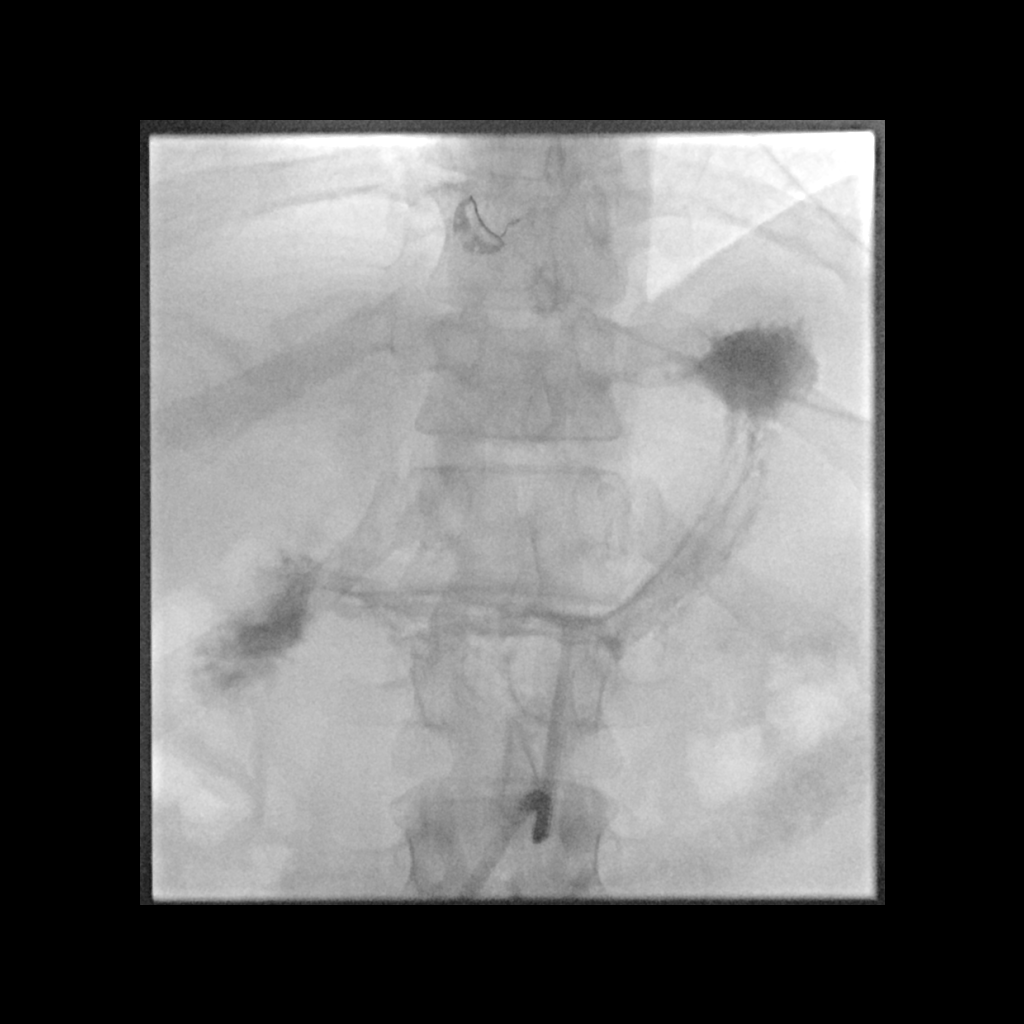

[1 of 1 positions shown; findings below may reference images not displayed]

EXAM:
GASTROSTOMY TUBE REPLACEMENT WITH FLUOROSCOPY

MEDICATIONS:
None

ANESTHESIA/SEDATION:
None

CONTRAST:  20 mL Omnipaque 300-administered into the gastric lumen.

FLUOROSCOPY TIME:  Fluoroscopy Time: 54 seconds, 9 mGy

COMPLICATIONS:
None immediate.

PROCEDURE:
Informed consent was obtained for gastrostomy tube replacement.
Anterior abdomen was prepped and draped in sterile fashion. Maximal
barrier sterile technique was utilized including caps, mask, sterile
gowns, sterile gloves, sterile drape, hand hygiene and skin
antiseptic. Viscous lidocaine was injected along the gastrostomy
tube tract. Five French Kumpe catheter was advanced into the stomach
through the gastrostomy tube hole. Contrast injection confirmed
placement in the stomach. Super stiff Amplatz wire was placed. The
tract was dilated to accommodate a 20 French balloon retention
gastrostomy tube. The balloon was inflated with approximately 9 mL
of saline. Contrast injection confirmed placement in the stomach.
Catheter was flushed with saline.

Fluoroscopic images were taken and saved for this procedure.
FINDINGS: New gastrostomy tube is well positioned in the stomach.
IMPRESSION: Successful replacement of the gastrostomy tube with fluoroscopy.
Patient currently has a 20 French balloon retention catheter.

## 2020-10-14 ENCOUNTER — Inpatient Hospital Stay (HOSPITAL_COMMUNITY)
Admission: EM | Admit: 2020-10-14 | Discharge: 2020-11-23 | DRG: 947 | Disposition: A | Discharge status: Skilled Nursing Facility | Payer: Medicaid Other | Attending: Family Medicine | Admitting: Family Medicine

## 2020-10-14 ENCOUNTER — Emergency Department (HOSPITAL_COMMUNITY): Payer: Medicaid Other

## 2020-10-14 DIAGNOSIS — F3130 Bipolar disorder, current episode depressed, mild or moderate severity, unspecified: Secondary | ICD-10-CM | POA: Diagnosis present

## 2020-10-14 DIAGNOSIS — I959 Hypotension, unspecified: Secondary | ICD-10-CM | POA: Diagnosis present

## 2020-10-14 DIAGNOSIS — Z818 Family history of other mental and behavioral disorders: Secondary | ICD-10-CM

## 2020-10-14 DIAGNOSIS — H6123 Impacted cerumen, bilateral: Secondary | ICD-10-CM | POA: Diagnosis present

## 2020-10-14 DIAGNOSIS — Z79899 Other long term (current) drug therapy: Secondary | ICD-10-CM

## 2020-10-14 DIAGNOSIS — Z87820 Personal history of traumatic brain injury: Secondary | ICD-10-CM

## 2020-10-14 DIAGNOSIS — F419 Anxiety disorder, unspecified: Secondary | ICD-10-CM | POA: Diagnosis present

## 2020-10-14 DIAGNOSIS — R531 Weakness: Principal | ICD-10-CM

## 2020-10-14 DIAGNOSIS — K219 Gastro-esophageal reflux disease without esophagitis: Secondary | ICD-10-CM | POA: Diagnosis present

## 2020-10-14 DIAGNOSIS — R4781 Slurred speech: Secondary | ICD-10-CM | POA: Diagnosis not present

## 2020-10-14 DIAGNOSIS — G8929 Other chronic pain: Secondary | ICD-10-CM | POA: Diagnosis present

## 2020-10-14 DIAGNOSIS — Z8674 Personal history of sudden cardiac arrest: Secondary | ICD-10-CM

## 2020-10-14 DIAGNOSIS — R471 Dysarthria and anarthria: Secondary | ICD-10-CM | POA: Diagnosis present

## 2020-10-14 DIAGNOSIS — S069X3S Unspecified intracranial injury with loss of consciousness of 1 hour to 5 hours 59 minutes, sequela: Secondary | ICD-10-CM

## 2020-10-14 DIAGNOSIS — F909 Attention-deficit hyperactivity disorder, unspecified type: Secondary | ICD-10-CM | POA: Diagnosis present

## 2020-10-14 DIAGNOSIS — Z23 Encounter for immunization: Secondary | ICD-10-CM

## 2020-10-14 DIAGNOSIS — Z6829 Body mass index (BMI) 29.0-29.9, adult: Secondary | ICD-10-CM

## 2020-10-14 DIAGNOSIS — K5900 Constipation, unspecified: Secondary | ICD-10-CM | POA: Diagnosis present

## 2020-10-14 DIAGNOSIS — T50905A Adverse effect of unspecified drugs, medicaments and biological substances, initial encounter: Secondary | ICD-10-CM | POA: Diagnosis present

## 2020-10-14 DIAGNOSIS — Z20822 Contact with and (suspected) exposure to covid-19: Secondary | ICD-10-CM | POA: Diagnosis present

## 2020-10-14 DIAGNOSIS — Z66 Do not resuscitate: Secondary | ICD-10-CM | POA: Diagnosis present

## 2020-10-14 DIAGNOSIS — S069XAA Unspecified intracranial injury with loss of consciousness status unknown, initial encounter: Secondary | ICD-10-CM | POA: Diagnosis present

## 2020-10-14 DIAGNOSIS — G319 Degenerative disease of nervous system, unspecified: Secondary | ICD-10-CM | POA: Diagnosis present

## 2020-10-14 DIAGNOSIS — L219 Seborrheic dermatitis, unspecified: Secondary | ICD-10-CM | POA: Diagnosis present

## 2020-10-14 DIAGNOSIS — F319 Bipolar disorder, unspecified: Secondary | ICD-10-CM | POA: Diagnosis present

## 2020-10-14 DIAGNOSIS — F4310 Post-traumatic stress disorder, unspecified: Secondary | ICD-10-CM | POA: Diagnosis present

## 2020-10-14 DIAGNOSIS — F17210 Nicotine dependence, cigarettes, uncomplicated: Secondary | ICD-10-CM | POA: Diagnosis present

## 2020-10-14 DIAGNOSIS — Z888 Allergy status to other drugs, medicaments and biological substances status: Secondary | ICD-10-CM

## 2020-10-14 DIAGNOSIS — G9389 Other specified disorders of brain: Secondary | ICD-10-CM | POA: Diagnosis present

## 2020-10-14 DIAGNOSIS — Z604 Social exclusion and rejection: Secondary | ICD-10-CM | POA: Diagnosis present

## 2020-10-14 DIAGNOSIS — Y929 Unspecified place or not applicable: Secondary | ICD-10-CM

## 2020-10-14 DIAGNOSIS — E669 Obesity, unspecified: Secondary | ICD-10-CM | POA: Diagnosis present

## 2020-10-14 DIAGNOSIS — G8250 Quadriplegia, unspecified: Secondary | ICD-10-CM | POA: Diagnosis present

## 2020-10-14 LAB — CBC
HCT: 45.9 % (ref 39.0–52.0)
Hemoglobin: 14.5 g/dL (ref 13.0–17.0)
MCH: 29.1 pg (ref 26.0–34.0)
MCHC: 31.6 g/dL (ref 30.0–36.0)
MCV: 92 fL (ref 80.0–100.0)
Platelets: 222 10*3/uL (ref 150–400)
RBC: 4.99 MIL/uL (ref 4.22–5.81)
RDW: 13.8 % (ref 11.5–15.5)
WBC: 6.6 10*3/uL (ref 4.0–10.5)
nRBC: 0 % (ref 0.0–0.2)

## 2020-10-14 LAB — COMPREHENSIVE METABOLIC PANEL
ALT: 14 U/L (ref 0–44)
AST: 11 U/L — ABNORMAL LOW (ref 15–41)
Albumin: 3.4 g/dL — ABNORMAL LOW (ref 3.5–5.0)
Alkaline Phosphatase: 77 U/L (ref 38–126)
Anion gap: 9 (ref 5–15)
BUN: 14 mg/dL (ref 6–20)
CO2: 28 mmol/L (ref 22–32)
Calcium: 8.8 mg/dL — ABNORMAL LOW (ref 8.9–10.3)
Chloride: 101 mmol/L (ref 98–111)
Creatinine, Ser: 0.81 mg/dL (ref 0.61–1.24)
GFR, Estimated: >60 mL/min (ref >60)
Glucose, Bld: 98 mg/dL (ref 70–99)
Potassium: 3.8 mmol/L (ref 3.5–5.1)
Sodium: 138 mmol/L (ref 135–145)
Total Bilirubin: 0.4 mg/dL (ref 0.3–1.2)
Total Protein: 6.6 g/dL (ref 6.5–8.1)

## 2020-10-14 LAB — DIFFERENTIAL
Abs Immature Granulocytes: 0.01 10*3/uL (ref 0.00–0.07)
Basophils Absolute: 0 10*3/uL (ref 0.0–0.1)
Basophils Relative: 1 %
Eosinophils Absolute: 0.2 10*3/uL (ref 0.0–0.5)
Eosinophils Relative: 4 %
Immature Granulocytes: 0 %
Lymphocytes Relative: 35 %
Lymphs Abs: 2.3 10*3/uL (ref 0.7–4.0)
Monocytes Absolute: 0.5 10*3/uL (ref 0.1–1.0)
Monocytes Relative: 7 %
Neutro Abs: 3.6 10*3/uL (ref 1.7–7.7)
Neutrophils Relative %: 53 %

## 2020-10-14 LAB — I-STAT CHEM 8, ED
BUN: 15 mg/dL (ref 6–20)
Calcium, Ion: 1.07 mmol/L — ABNORMAL LOW (ref 1.15–1.40)
Chloride: 101 mmol/L (ref 98–111)
Creatinine, Ser: 0.8 mg/dL (ref 0.61–1.24)
Glucose, Bld: 95 mg/dL (ref 70–99)
HCT: 45 % (ref 39.0–52.0)
Hemoglobin: 15.3 g/dL (ref 13.0–17.0)
Potassium: 3.8 mmol/L (ref 3.5–5.1)
Sodium: 138 mmol/L (ref 135–145)
TCO2: 28 mmol/L (ref 22–32)

## 2020-10-14 LAB — APTT: aPTT: 29 seconds (ref 24–36)

## 2020-10-14 LAB — CBG MONITORING, ED: Glucose-Capillary: 93 mg/dL (ref 70–99)

## 2020-10-14 LAB — PROTIME-INR
INR: 1.1 (ref 0.8–1.2)
Prothrombin Time: 14 seconds (ref 11.4–15.2)

## 2020-10-14 MED ORDER — SODIUM CHLORIDE 0.9% FLUSH: 3.0000 mL | Freq: Once | INTRAVENOUS | Status: DC

## 2020-10-14 MED ORDER — IOHEXOL 350 MG/ML SOLN
80.0000 mL | Freq: Once | INTRAVENOUS | Status: AC | PRN
  Administered 2020-10-14: 80 mL via INTRAVENOUS
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
## 2020-10-15 ENCOUNTER — Observation Stay (HOSPITAL_COMMUNITY): Payer: Medicaid Other

## 2020-10-15 ENCOUNTER — Encounter (HOSPITAL_COMMUNITY): Payer: Self-pay | Admitting: Internal Medicine

## 2020-10-15 ENCOUNTER — Emergency Department (HOSPITAL_COMMUNITY): Payer: Medicaid Other

## 2020-10-15 DIAGNOSIS — F319 Bipolar disorder, unspecified: Secondary | ICD-10-CM | POA: Diagnosis not present

## 2020-10-15 DIAGNOSIS — R531 Weakness: Secondary | ICD-10-CM

## 2020-10-15 DIAGNOSIS — K219 Gastro-esophageal reflux disease without esophagitis: Secondary | ICD-10-CM | POA: Diagnosis present

## 2020-10-15 DIAGNOSIS — R4781 Slurred speech: Secondary | ICD-10-CM

## 2020-10-15 DIAGNOSIS — S069X3S Unspecified intracranial injury with loss of consciousness of 1 hour to 5 hours 59 minutes, sequela: Secondary | ICD-10-CM | POA: Diagnosis not present

## 2020-10-15 LAB — COMPREHENSIVE METABOLIC PANEL
ALT: 13 U/L (ref 0–44)
AST: 10 U/L — ABNORMAL LOW (ref 15–41)
Albumin: 3 g/dL — ABNORMAL LOW (ref 3.5–5.0)
Alkaline Phosphatase: 67 U/L (ref 38–126)
Anion gap: 9 (ref 5–15)
BUN: 13 mg/dL (ref 6–20)
CO2: 25 mmol/L (ref 22–32)
Calcium: 8.3 mg/dL — ABNORMAL LOW (ref 8.9–10.3)
Chloride: 103 mmol/L (ref 98–111)
Creatinine, Ser: 0.75 mg/dL (ref 0.61–1.24)
GFR, Estimated: >60 mL/min (ref >60)
Glucose, Bld: 97 mg/dL (ref 70–99)
Potassium: 3.8 mmol/L (ref 3.5–5.1)
Sodium: 137 mmol/L (ref 135–145)
Total Bilirubin: 0.4 mg/dL (ref 0.3–1.2)
Total Protein: 5.8 g/dL — ABNORMAL LOW (ref 6.5–8.1)

## 2020-10-15 LAB — LIPID PANEL
Cholesterol: 114 mg/dL (ref 0–200)
HDL: 22 mg/dL — ABNORMAL LOW (ref >40)
LDL Cholesterol: 67 mg/dL (ref 0–99)
Total CHOL/HDL Ratio: 5.2 RATIO
Triglycerides: 127 mg/dL (ref <150)
VLDL: 25 mg/dL (ref 0–40)

## 2020-10-15 LAB — CBC
HCT: 41.5 % (ref 39.0–52.0)
Hemoglobin: 13 g/dL (ref 13.0–17.0)
MCH: 28.9 pg (ref 26.0–34.0)
MCHC: 31.3 g/dL (ref 30.0–36.0)
MCV: 92.2 fL (ref 80.0–100.0)
Platelets: 222 10*3/uL (ref 150–400)
RBC: 4.5 MIL/uL (ref 4.22–5.81)
RDW: 13.9 % (ref 11.5–15.5)
WBC: 6.7 10*3/uL (ref 4.0–10.5)
nRBC: 0 % (ref 0.0–0.2)

## 2020-10-15 LAB — RESP PANEL BY RT-PCR (FLU A&B, COVID) ARPGX2
Influenza A by PCR: NEGATIVE
Influenza B by PCR: NEGATIVE
SARS Coronavirus 2 by RT PCR: NEGATIVE

## 2020-10-15 LAB — HEMOGLOBIN A1C
Hgb A1c MFr Bld: 4.9 % (ref 4.8–5.6)
Mean Plasma Glucose: 93.93 mg/dL

## 2020-10-15 LAB — HIV ANTIBODY (ROUTINE TESTING W REFLEX): HIV Screen 4th Generation wRfx: NONREACTIVE

## 2020-10-15 LAB — LITHIUM LEVEL: Lithium Lvl: 0.6 mmol/L (ref 0.60–1.20)

## 2020-10-15 MED ORDER — QUETIAPINE FUMARATE 200 MG PO TABS
200.0000 mg | ORAL_TABLET | Freq: Every day | ORAL | Status: DC
  Administered 2020-10-15 – 2020-10-20 (×6): 200 mg via ORAL
  Filled 2020-10-15 (×3): qty 1
  Filled 2020-10-15: qty 2
  Filled 2020-10-15 (×4): qty 1

## 2020-10-15 MED ORDER — PANTOPRAZOLE SODIUM 40 MG PO TBEC
40.0000 mg | DELAYED_RELEASE_TABLET | Freq: Every day | ORAL | Status: DC
  Administered 2020-10-15 – 2020-11-23 (×40): 40 mg via ORAL
  Filled 2020-10-15 (×41): qty 1

## 2020-10-15 MED ORDER — ENOXAPARIN SODIUM 40 MG/0.4ML IJ SOSY
40.0000 mg | PREFILLED_SYRINGE | Freq: Every day | INTRAMUSCULAR | Status: DC
  Administered 2020-10-15 – 2020-11-23 (×40): 40 mg via SUBCUTANEOUS
  Filled 2020-10-15 (×41): qty 0.4

## 2020-10-15 MED ORDER — ACETAMINOPHEN 650 MG RE SUPP: 650.0000 mg | RECTAL | Status: DC | PRN

## 2020-10-15 MED ORDER — TRAMADOL HCL 50 MG PO TABS
50.0000 mg | ORAL_TABLET | Freq: Four times a day (QID) | ORAL | Status: AC | PRN
  Administered 2020-10-16 – 2020-10-17 (×3): 50 mg via ORAL
  Filled 2020-10-15 (×3): qty 1

## 2020-10-15 MED ORDER — ACETAMINOPHEN 160 MG/5ML PO SOLN: 650.0000 mg | ORAL | Status: DC | PRN

## 2020-10-15 MED ORDER — ZOLPIDEM TARTRATE 5 MG PO TABS
10.0000 mg | ORAL_TABLET | Freq: Every evening | ORAL | Status: DC | PRN
  Administered 2020-10-17 – 2020-11-22 (×37): 10 mg via ORAL
  Filled 2020-10-15 (×37): qty 2

## 2020-10-15 MED ORDER — ASPIRIN 81 MG PO CHEW
81.0000 mg | CHEWABLE_TABLET | Freq: Every day | ORAL | Status: DC
  Administered 2020-10-16 – 2020-11-23 (×39): 81 mg via ORAL
  Filled 2020-10-15 (×41): qty 1

## 2020-10-15 MED ORDER — HYDROMORPHONE HCL 1 MG/ML IJ SOLN
1.0000 mg | INTRAMUSCULAR | Status: AC | PRN
  Administered 2020-10-15 – 2020-10-16 (×4): 1 mg via INTRAVENOUS
  Filled 2020-10-15 (×4): qty 1

## 2020-10-15 MED ORDER — NICOTINE 14 MG/24HR TD PT24
14.0000 mg | MEDICATED_PATCH | TRANSDERMAL | Status: DC
  Administered 2020-10-15 – 2020-11-23 (×40): 14 mg via TRANSDERMAL
  Filled 2020-10-15 (×40): qty 1

## 2020-10-15 MED ORDER — SODIUM CHLORIDE 0.9 % IV BOLUS
1000.0000 mL | Freq: Once | INTRAVENOUS | Status: AC
  Administered 2020-10-15: 1000 mL via INTRAVENOUS

## 2020-10-15 MED ORDER — DANTROLENE SODIUM 25 MG PO CAPS
50.0000 mg | ORAL_CAPSULE | Freq: Four times a day (QID) | ORAL | Status: DC
  Administered 2020-10-15 – 2020-11-23 (×156): 50 mg via ORAL
  Filled 2020-10-15 (×165): qty 2

## 2020-10-15 MED ORDER — ASPIRIN 325 MG PO TABS
325.0000 mg | ORAL_TABLET | Freq: Once | ORAL | Status: AC
  Administered 2020-10-15: 325 mg via ORAL
  Filled 2020-10-15: qty 1

## 2020-10-15 MED ORDER — STROKE: EARLY STAGES OF RECOVERY BOOK
Freq: Once | Status: AC
  Administered 2020-10-15: 1
  Filled 2020-10-15: qty 1

## 2020-10-15 MED ORDER — ACETAMINOPHEN 325 MG PO TABS
650.0000 mg | ORAL_TABLET | ORAL | Status: DC | PRN
  Administered 2020-10-15 – 2020-11-23 (×83): 650 mg via ORAL
  Filled 2020-10-15 (×83): qty 2

## 2020-10-15 MED ORDER — DULOXETINE HCL 60 MG PO CPEP
60.0000 mg | ORAL_CAPSULE | Freq: Every day | ORAL | Status: DC
  Administered 2020-10-15 – 2020-10-29 (×15): 60 mg via ORAL
  Filled 2020-10-15 (×16): qty 1

## 2020-10-15 MED ORDER — BACLOFEN 10 MG PO TABS
20.0000 mg | ORAL_TABLET | Freq: Three times a day (TID) | ORAL | Status: DC
  Administered 2020-10-15 – 2020-11-23 (×117): 20 mg via ORAL
  Filled 2020-10-15 (×46): qty 2
  Filled 2020-10-15: qty 1
  Filled 2020-10-15 (×38): qty 2
  Filled 2020-10-15 (×2): qty 1
  Filled 2020-10-15 (×35): qty 2

## 2020-10-15 MED ORDER — ONDANSETRON HCL 4 MG/2ML IJ SOLN: 4.0000 mg | Freq: Four times a day (QID) | INTRAMUSCULAR | Status: DC | PRN
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
## 2020-10-15 NOTE — ED Notes (Signed)
OT at bedside. 

## 2020-10-15 NOTE — Assessment & Plan Note (Signed)
   Patient presenting with sudden worsening of right-sided weakness with associated slurred speech.  Last known normal 6 PM.  Assessment somewhat difficult due to neurological deficits at baseline with history of anoxic brain injury  Patient reports that he is now neurologically back at baseline  Patient is already been evaluated by Dr. Cheral Marker with neurology who recommended proceeding with stroke work-up including noncontrast MRI of brain  CT angiogram of the head and neck as well as noncontrast CT of the head have already been performed.  Echocardiogram with bubble study to be performed later this morning  Serial neurologic checks  PT, OT, SLP evaluation  Permissive hypertension until stroke is ruled out

## 2020-10-15 NOTE — ED Notes (Signed)
Patient back from CT at this time
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
## 2020-10-15 NOTE — ED Notes (Signed)
Provider at bedside

## 2020-10-15 NOTE — ED Notes (Signed)
Pt to MRI at this time.
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
## 2020-10-15 NOTE — ED Notes (Signed)
Daughter Loma Sousa [phone] would like an update

## 2020-10-15 NOTE — Progress Notes (Signed)
Pt has been admitted to the unit. All belongings are in the room. IV and Tele has been connected. Call light and telephone are within reach.

## 2020-10-15 NOTE — Assessment & Plan Note (Signed)
   Once home medication list is verified we will resume home regimen of psychotropic medications
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
## 2020-10-15 NOTE — ED Notes (Signed)
PT at bedside.

## 2020-10-15 NOTE — ED Notes (Signed)
1000 dantrolene not given, not available from pharmacy.

## 2020-10-15 NOTE — Progress Notes (Signed)
SLP Cancellation Note  Patient Details Name: Chase Caldwell MRN: [id number] DOB: March 03, 1980   Cancelled treatment:       Reason Eval/Treat Not Completed: Patient unavailable - currently working with OT. Will continue efforts.  Jatziri Goffredo B. Quentin Ore, [facility], [place] Speech Language Pathologist Office: [phone]  Shonna Chock 10/15/2020, 2:09 PM
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
## 2020-10-15 NOTE — Plan of Care (Signed)

## 2020-10-15 NOTE — ED Notes (Signed)
Spouse at bedside

## 2020-10-15 NOTE — Assessment & Plan Note (Signed)
   Continue home regimen of daily PPI

## 2020-10-16 ENCOUNTER — Other Ambulatory Visit: Payer: Self-pay

## 2020-10-16 ENCOUNTER — Observation Stay (HOSPITAL_BASED_OUTPATIENT_CLINIC_OR_DEPARTMENT_OTHER): Payer: Medicaid Other

## 2020-10-16 DIAGNOSIS — I639 Cerebral infarction, unspecified: Secondary | ICD-10-CM | POA: Diagnosis not present

## 2020-10-16 DIAGNOSIS — R531 Weakness: Secondary | ICD-10-CM | POA: Diagnosis not present

## 2020-10-16 DIAGNOSIS — I10 Essential (primary) hypertension: Secondary | ICD-10-CM

## 2020-10-16 DIAGNOSIS — R4781 Slurred speech: Secondary | ICD-10-CM | POA: Diagnosis not present

## 2020-10-16 DIAGNOSIS — F319 Bipolar disorder, unspecified: Secondary | ICD-10-CM | POA: Diagnosis not present

## 2020-10-16 LAB — ECHOCARDIOGRAM COMPLETE BUBBLE STUDY
Area-P 1/2: 3.72 cm2
Calc EF: 54.4 %
S' Lateral: 3.1 cm
Single Plane A2C EF: 52.5 %
Single Plane A4C EF: 55.3 %

## 2020-10-16 MED ORDER — LITHIUM CARBONATE 300 MG PO CAPS
300.0000 mg | ORAL_CAPSULE | Freq: Two times a day (BID) | ORAL | Status: DC
  Administered 2020-10-16 – 2020-11-23 (×77): 300 mg via ORAL
  Filled 2020-10-16 (×79): qty 1

## 2020-10-16 MED ORDER — QUETIAPINE FUMARATE 50 MG PO TABS
50.0000 mg | ORAL_TABLET | Freq: Every day | ORAL | Status: DC
  Administered 2020-10-16 – 2020-11-23 (×39): 50 mg via ORAL
  Filled 2020-10-16 (×39): qty 1

## 2020-10-16 MED ORDER — CLONAZEPAM 0.5 MG PO TABS
1.0000 mg | ORAL_TABLET | Freq: Four times a day (QID) | ORAL | Status: DC
  Administered 2020-10-16 – 2020-11-23 (×147): 1 mg via ORAL
  Filled 2020-10-16 (×149): qty 2

## 2020-10-16 MED ORDER — SERTRALINE HCL 50 MG PO TABS
50.0000 mg | ORAL_TABLET | Freq: Every day | ORAL | Status: DC
  Administered 2020-10-17 – 2020-10-29 (×13): 50 mg via ORAL
  Filled 2020-10-16 (×14): qty 1

## 2020-10-16 MED ORDER — LISINOPRIL 10 MG PO TABS
10.0000 mg | ORAL_TABLET | Freq: Every day | ORAL | Status: DC
  Administered 2020-10-16 – 2020-11-21 (×35): 10 mg via ORAL
  Filled 2020-10-16 (×40): qty 1

## 2020-10-16 MED ORDER — SERTRALINE HCL 50 MG PO TABS
50.0000 mg | ORAL_TABLET | Freq: Every day | ORAL | Status: DC
  Administered 2020-10-16: 50 mg
  Filled 2020-10-16: qty 1

## 2020-10-16 MED ORDER — GABAPENTIN 100 MG PO CAPS
200.0000 mg | ORAL_CAPSULE | Freq: Every day | ORAL | Status: DC
  Administered 2020-10-16 – 2020-11-22 (×38): 200 mg via ORAL
  Filled 2020-10-16 (×39): qty 2

## 2020-10-16 NOTE — Plan of Care (Signed)
  Problem: Education: Goal: Knowledge of General Education information will improve Description: Including pain rating scale, medication(s)/side effects and non-pharmacologic comfort measures 10/16/2020 0354 by Tobe Sos, RN Outcome: Progressing 10/15/2020 2350 by Tobe Sos, RN Outcome: Progressing   Problem: Health Behavior/Discharge Planning: Goal: Ability to manage health-related needs will improve 10/16/2020 0354 by Tobe Sos, RN Outcome: Progressing 10/15/2020 2350 by Tobe Sos, RN Outcome: Progressing   Problem: Clinical Measurements: Goal: Ability to maintain clinical measurements within normal limits will improve 10/16/2020 0354 by Tobe Sos, RN Outcome: Progressing 10/15/2020 2350 by Tobe Sos, RN Outcome: Progressing Goal: Will remain free from infection 10/16/2020 0354 by Tobe Sos, RN Outcome: Progressing 10/15/2020 2350 by Tobe Sos, RN Outcome: Progressing Goal: Diagnostic test results will improve 10/16/2020 0354 by Tobe Sos, RN Outcome: Progressing 10/15/2020 2350 by Tobe Sos, RN Outcome: Progressing Goal: Respiratory complications will improve 10/16/2020 0354 by Tobe Sos, RN Outcome: Progressing 10/15/2020 2350 by Tobe Sos, RN Outcome: Progressing Goal: Cardiovascular complication will be avoided 10/16/2020 0354 by Tobe Sos, RN Outcome: Progressing 10/15/2020 2350 by Tobe Sos, RN Outcome: Progressing   Problem: Activity: Goal: Risk for activity intolerance will decrease 10/16/2020 0354 by Tobe Sos, RN Outcome: Progressing 10/15/2020 2350 by Tobe Sos, RN Outcome: Progressing   Problem: Nutrition: Goal: Adequate nutrition will be maintained 10/16/2020 0354 by Tobe Sos, RN Outcome: Progressing 10/15/2020 2350 by Tobe Sos, RN Outcome: Progressing   Problem: Coping: Goal: Level of anxiety will  decrease 10/16/2020 0354 by Tobe Sos, RN Outcome: Progressing 10/15/2020 2350 by Tobe Sos, RN Outcome: Progressing   Problem: Elimination: Goal: Will not experience complications related to bowel motility 10/16/2020 0354 by Tobe Sos, RN Outcome: Progressing 10/15/2020 2350 by Tobe Sos, RN Outcome: Progressing Goal: Will not experience complications related to urinary retention 10/16/2020 0354 by Tobe Sos, RN Outcome: Progressing 10/15/2020 2350 by Tobe Sos, RN Outcome: Progressing   Problem: Pain Managment: Goal: General experience of comfort will improve 10/16/2020 0354 by Tobe Sos, RN Outcome: Progressing 10/15/2020 2350 by Tobe Sos, RN Outcome: Progressing   Problem: Safety: Goal: Ability to remain free from injury will improve 10/16/2020 0354 by Tobe Sos, RN Outcome: Progressing 10/15/2020 2350 by Tobe Sos, RN Outcome: Progressing   Problem: Skin Integrity: Goal: Risk for impaired skin integrity will decrease 10/16/2020 0354 by Tobe Sos, RN Outcome: Progressing 10/15/2020 2350 by Tobe Sos, RN Outcome: Progressing   Problem: Education: Goal: Knowledge of disease or condition will improve Outcome: Progressing Goal: Knowledge of secondary prevention will improve Outcome: Progressing Goal: Knowledge of patient specific risk factors addressed and post discharge goals established will improve Outcome: Progressing   Problem: Self-Care: Goal: Verbalization of feelings and concerns over difficulty with self-care will improve Outcome: Progressing   Problem: Nutrition: Goal: Risk of aspiration will decrease Outcome: Progressing Goal: Dietary intake will improve Outcome: Progressing

## 2020-10-16 NOTE — Progress Notes (Signed)
  Echocardiogram 2D Echocardiogram has been performed.  Chase Caldwell 10/16/2020, 9:12 AM

## 2020-10-16 NOTE — Progress Notes (Signed)
Name: Chase Caldwell DOB: [birth year]  Please be advised that the above-named patient will require a short-term nursing home stay -- anticipated 30 days or less for rehabilitation and strengthening. The plan is for return home.
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
## 2020-10-17 DIAGNOSIS — R4781 Slurred speech: Secondary | ICD-10-CM | POA: Diagnosis not present

## 2020-10-17 DIAGNOSIS — R531 Weakness: Secondary | ICD-10-CM | POA: Diagnosis not present

## 2020-10-17 DIAGNOSIS — F319 Bipolar disorder, unspecified: Secondary | ICD-10-CM | POA: Diagnosis not present
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
## 2020-10-18 DIAGNOSIS — R4781 Slurred speech: Secondary | ICD-10-CM | POA: Diagnosis not present

## 2020-10-18 DIAGNOSIS — R531 Weakness: Secondary | ICD-10-CM | POA: Diagnosis not present

## 2020-10-18 DIAGNOSIS — F319 Bipolar disorder, unspecified: Secondary | ICD-10-CM | POA: Diagnosis not present
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
## 2020-10-19 DIAGNOSIS — R4781 Slurred speech: Secondary | ICD-10-CM | POA: Diagnosis not present

## 2020-10-19 DIAGNOSIS — R531 Weakness: Secondary | ICD-10-CM | POA: Diagnosis not present

## 2020-10-19 DIAGNOSIS — F319 Bipolar disorder, unspecified: Secondary | ICD-10-CM | POA: Diagnosis not present
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
## 2020-10-19 NOTE — Plan of Care (Signed)
  Problem: Clinical Measurements: Goal: Ability to maintain clinical measurements within normal limits will improve Outcome: Progressing Goal: Will remain free from infection Outcome: Progressing Goal: Diagnostic test results will improve Outcome: Progressing Goal: Respiratory complications will improve Outcome: Progressing Goal: Cardiovascular complication will be avoided Outcome: Progressing   Problem: Ischemic Stroke/TIA Tissue Perfusion: Goal: Complications of ischemic stroke/TIA will be minimized Outcome: Progressing

## 2020-10-20 DIAGNOSIS — R531 Weakness: Secondary | ICD-10-CM | POA: Diagnosis not present

## 2020-10-20 DIAGNOSIS — F319 Bipolar disorder, unspecified: Secondary | ICD-10-CM | POA: Diagnosis not present

## 2020-10-20 DIAGNOSIS — R4781 Slurred speech: Secondary | ICD-10-CM | POA: Diagnosis not present
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
## 2020-10-20 NOTE — Plan of Care (Signed)
  Problem: Clinical Measurements: Goal: Ability to maintain clinical measurements within normal limits will improve Outcome: Progressing Goal: Will remain free from infection Outcome: Progressing Goal: Diagnostic test results will improve Outcome: Progressing Goal: Respiratory complications will improve Outcome: Progressing Goal: Cardiovascular complication will be avoided Outcome: Progressing   Problem: Safety: Goal: Ability to remain free from injury will improve Outcome: Progressing   Problem: Ischemic Stroke/TIA Tissue Perfusion: Goal: Complications of ischemic stroke/TIA will be minimized Outcome: Progressing   

## 2020-10-21 MED ORDER — QUETIAPINE FUMARATE 100 MG PO TABS
100.0000 mg | ORAL_TABLET | Freq: Every day | ORAL | Status: DC
  Administered 2020-10-21 – 2020-10-29 (×9): 100 mg via ORAL
  Filled 2020-10-21 (×9): qty 1

## 2020-10-21 NOTE — TOC Progression Note (Signed)
Transition of Care [facility]) - Progression Note    Patient Details  Name: Chase Caldwell MRN: [id number] Date of Birth: Dec 29, 1980  Transition of Care [facility]) CM/SW [place], [place] Phone Number: 10/21/2020, 11:27 AM  Clinical Narrative:   CSW spoke with Admissions at [facility] to discuss patient's care and ask about any regional facilities that may have Medicaid beds. Admissions to speak with management to ask about any facilities that have beds available. CSW also sent a message to Administration at [facility] to ask about Medicaid beds. No bed offers at this time.  CSW met with PASRR assessor at bedside to complete PASRR interview. CSW notified PASRR assessor that patient will need long term care. PASRR interview complete.     Expected Discharge Plan: Long Term Nursing Home Barriers to Discharge: Ship broker, [place] (PASRR)  Expected Discharge Plan and Services Expected Discharge Plan: [place]     Post Acute Care Choice: Nursing Home Living arrangements for the past 2 months: Single Family Home                                       Social Determinants of Health (SDOH) Interventions    Readmission Risk Interventions No flowsheet data found.
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
## 2020-10-22 DIAGNOSIS — E669 Obesity, unspecified: Secondary | ICD-10-CM | POA: Diagnosis present

## 2020-10-22 DIAGNOSIS — K219 Gastro-esophageal reflux disease without esophagitis: Secondary | ICD-10-CM | POA: Diagnosis present

## 2020-10-22 DIAGNOSIS — R471 Dysarthria and anarthria: Secondary | ICD-10-CM | POA: Diagnosis present

## 2020-10-22 DIAGNOSIS — F17210 Nicotine dependence, cigarettes, uncomplicated: Secondary | ICD-10-CM | POA: Diagnosis present

## 2020-10-22 DIAGNOSIS — Y929 Unspecified place or not applicable: Secondary | ICD-10-CM | POA: Diagnosis not present

## 2020-10-22 DIAGNOSIS — R531 Weakness: Secondary | ICD-10-CM | POA: Diagnosis not present

## 2020-10-22 DIAGNOSIS — F909 Attention-deficit hyperactivity disorder, unspecified type: Secondary | ICD-10-CM | POA: Diagnosis present

## 2020-10-22 DIAGNOSIS — R4781 Slurred speech: Secondary | ICD-10-CM | POA: Diagnosis present

## 2020-10-22 DIAGNOSIS — L219 Seborrheic dermatitis, unspecified: Secondary | ICD-10-CM | POA: Diagnosis present

## 2020-10-22 DIAGNOSIS — F3130 Bipolar disorder, current episode depressed, mild or moderate severity, unspecified: Secondary | ICD-10-CM | POA: Diagnosis present

## 2020-10-22 DIAGNOSIS — Z8674 Personal history of sudden cardiac arrest: Secondary | ICD-10-CM | POA: Diagnosis not present

## 2020-10-22 DIAGNOSIS — G8250 Quadriplegia, unspecified: Secondary | ICD-10-CM | POA: Diagnosis present

## 2020-10-22 DIAGNOSIS — Z66 Do not resuscitate: Secondary | ICD-10-CM | POA: Diagnosis present

## 2020-10-22 DIAGNOSIS — G9389 Other specified disorders of brain: Secondary | ICD-10-CM | POA: Diagnosis present

## 2020-10-22 DIAGNOSIS — S069X0S Unspecified intracranial injury without loss of consciousness, sequela: Secondary | ICD-10-CM | POA: Diagnosis not present

## 2020-10-22 DIAGNOSIS — T50905A Adverse effect of unspecified drugs, medicaments and biological substances, initial encounter: Secondary | ICD-10-CM | POA: Diagnosis present

## 2020-10-22 DIAGNOSIS — G319 Degenerative disease of nervous system, unspecified: Secondary | ICD-10-CM | POA: Diagnosis present

## 2020-10-22 DIAGNOSIS — Z20822 Contact with and (suspected) exposure to covid-19: Secondary | ICD-10-CM | POA: Diagnosis present

## 2020-10-22 DIAGNOSIS — Z6829 Body mass index (BMI) 29.0-29.9, adult: Secondary | ICD-10-CM | POA: Diagnosis not present

## 2020-10-22 DIAGNOSIS — Z87820 Personal history of traumatic brain injury: Secondary | ICD-10-CM | POA: Diagnosis not present

## 2020-10-22 DIAGNOSIS — K5900 Constipation, unspecified: Secondary | ICD-10-CM | POA: Diagnosis present

## 2020-10-22 DIAGNOSIS — H6123 Impacted cerumen, bilateral: Secondary | ICD-10-CM | POA: Diagnosis present

## 2020-10-22 DIAGNOSIS — S069X9S Unspecified intracranial injury with loss of consciousness of unspecified duration, sequela: Secondary | ICD-10-CM | POA: Diagnosis not present

## 2020-10-22 DIAGNOSIS — F319 Bipolar disorder, unspecified: Secondary | ICD-10-CM | POA: Diagnosis not present

## 2020-10-22 DIAGNOSIS — Z604 Social exclusion and rejection: Secondary | ICD-10-CM | POA: Diagnosis present

## 2020-10-22 DIAGNOSIS — Z23 Encounter for immunization: Secondary | ICD-10-CM | POA: Diagnosis not present

## 2020-10-22 DIAGNOSIS — G8929 Other chronic pain: Secondary | ICD-10-CM | POA: Diagnosis present

## 2020-10-22 DIAGNOSIS — F4310 Post-traumatic stress disorder, unspecified: Secondary | ICD-10-CM | POA: Diagnosis present

## 2020-10-22 DIAGNOSIS — I959 Hypotension, unspecified: Secondary | ICD-10-CM | POA: Diagnosis present

## 2020-10-22 NOTE — TOC Progression Note (Addendum)
Transition of Care [facility]) - Progression Note    Patient Details  Name: Chase Caldwell MRN: [id number] Date of Birth: 112/16/1982  Transition of Care [facility]) CM/SW [place], [place] Phone Number: 10/22/2020, 7:41 AM  Clinical Narrative:    CM spoke with Audelia Acton, CM with [facility] facilities and asked for assistance finding LTC bed placement - will review for possible placement.    CM and MSW with DTP Team will continue to follow the patient for needed LTC placement since the patient's wife is unable to provide for care at the home.  10/22/2020 1449 - CM called and left a message with Dyane Dustman, CM with [facility] and asked that she review the patient for LTC bed offer.  I also spoke with [facility] admission and ask that they review the patient for admission since the facility has available LTC beds.   Expected Discharge Plan: Long Term Nursing Home Barriers to Discharge: Ship broker, [place] (PASRR)  Expected Discharge Plan and Services Expected Discharge Plan: [place]     Post Acute Care Choice: Nursing Home Living arrangements for the past 2 months: Single Family Home                                       Social Determinants of Health (SDOH) Interventions    Readmission Risk Interventions No flowsheet data found.
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
## 2020-10-22 NOTE — Plan of Care (Signed)
  Problem: Education: Goal: Knowledge of General Education information will improve Description: Including pain rating scale, medication(s)/side effects and non-pharmacologic comfort measures Outcome: Progressing   Problem: Health Behavior/Discharge Planning: Goal: Ability to manage health-related needs will improve Outcome: Progressing   Problem: Clinical Measurements: Goal: Ability to maintain clinical measurements within normal limits will improve Outcome: Progressing Goal: Will remain free from infection Outcome: Progressing Goal: Diagnostic test results will improve Outcome: Progressing Goal: Respiratory complications will improve Outcome: Progressing Goal: Cardiovascular complication will be avoided Outcome: Progressing   Problem: Activity: Goal: Risk for activity intolerance will decrease Outcome: Progressing   Problem: Nutrition: Goal: Adequate nutrition will be maintained Outcome: Progressing   Problem: Coping: Goal: Level of anxiety will decrease Outcome: Progressing   Problem: Elimination: Goal: Will not experience complications related to bowel motility Outcome: Progressing Goal: Will not experience complications related to urinary retention Outcome: Progressing   Problem: Pain Managment: Goal: General experience of comfort will improve Outcome: Progressing   Problem: Safety: Goal: Ability to remain free from injury will improve Outcome: Progressing   Problem: Skin Integrity: Goal: Risk for impaired skin integrity will decrease Outcome: Progressing   Problem: Education: Goal: Knowledge of disease or condition will improve Outcome: Progressing Goal: Knowledge of secondary prevention will improve Outcome: Progressing Goal: Knowledge of patient specific risk factors addressed and post discharge goals established will improve Outcome: Progressing   Problem: Self-Care: Goal: Verbalization of feelings and concerns over difficulty with self-care will  improve Outcome: Progressing   Problem: Nutrition: Goal: Risk of aspiration will decrease Outcome: Progressing Goal: Dietary intake will improve Outcome: Progressing

## 2020-10-23 IMAGING — XA IR REPLACE G-TUBE/COLONIC TUBE
1 series · 9 of 9 positions shown · non-contrast
Comparison: Fluoroscopic guided gastrostomy tube
exchange-07/11/2019.

INDICATION: Malfunctioning gastrostomy tube. Please perform fluoroscopic guided
exchange.

EXAM:
FLUOROSCOPIC GUIDED REPLACEMENT OF GASTROSTOMY TUB

[Series 300: dsa body · 9 of 9 slices shown]
[im 1/9]
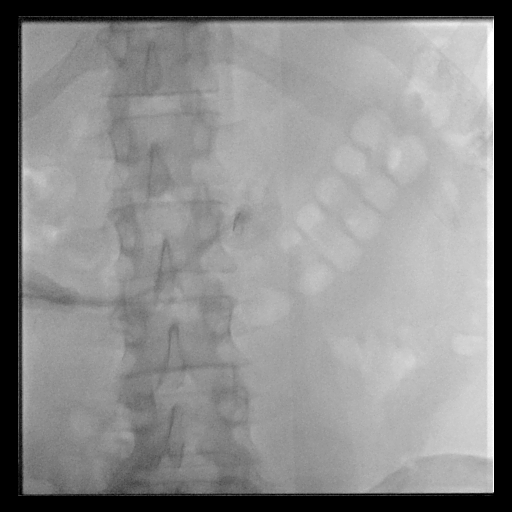
[im 2/9]
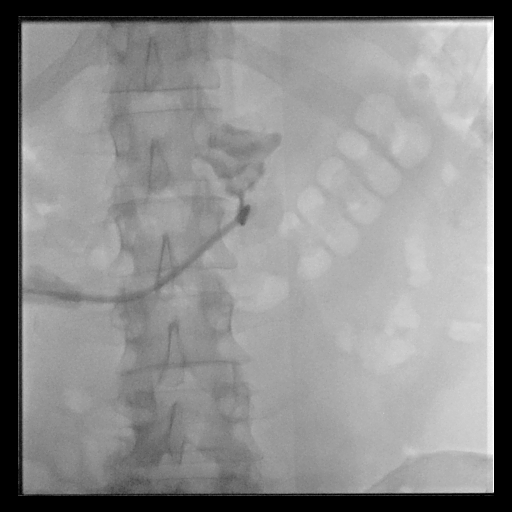
[im 3/9]
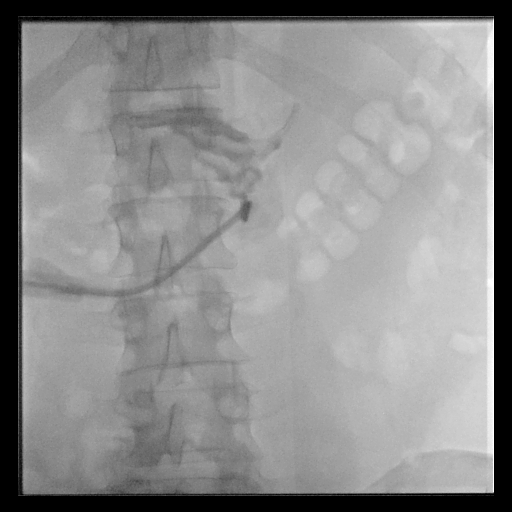
[im 4/9]
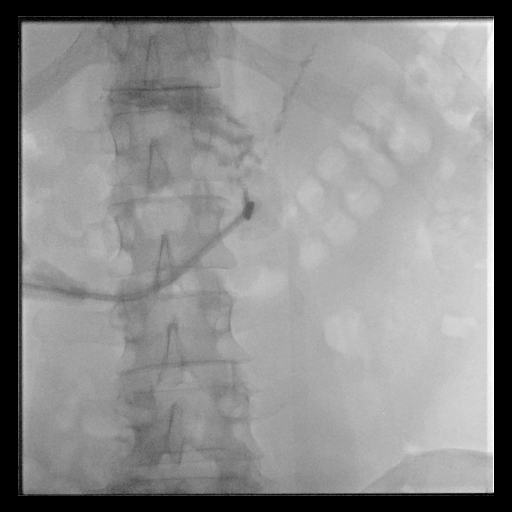
[im 5/9]
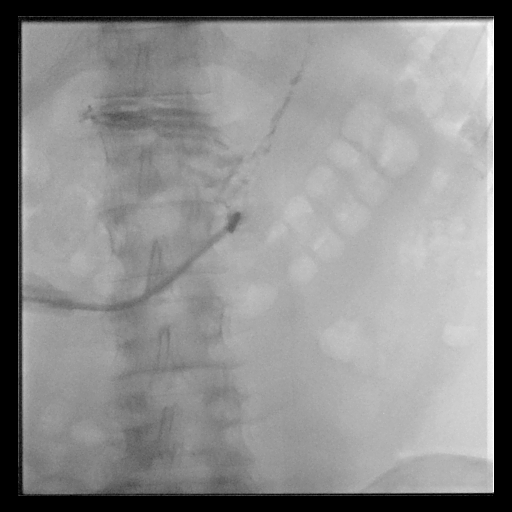
[im 6/9]
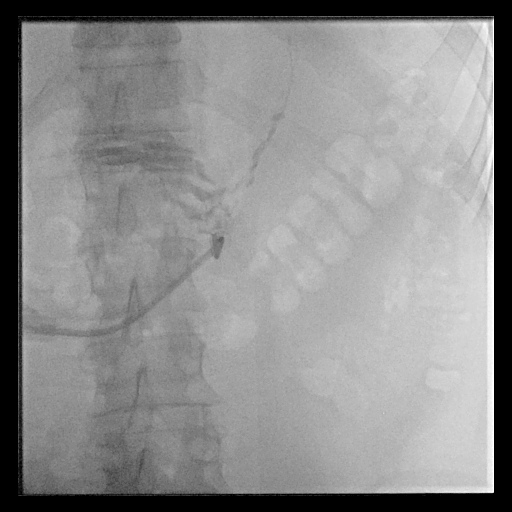
[im 7/9]
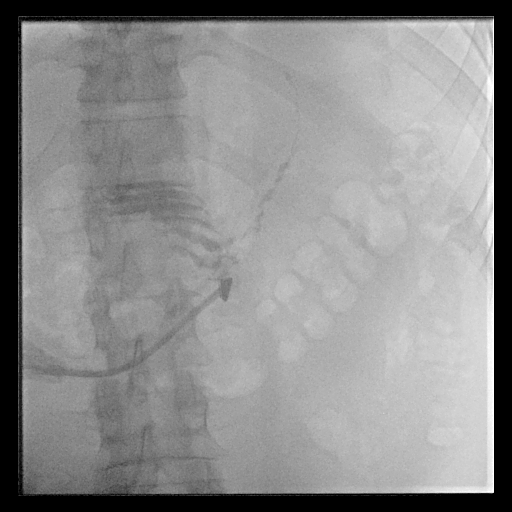
[im 8/9]
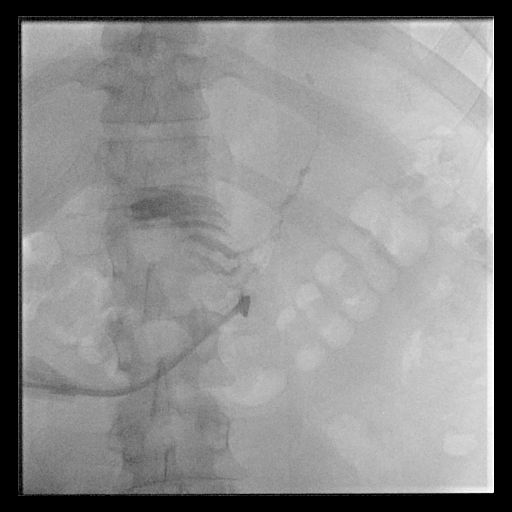
[im 9/9]
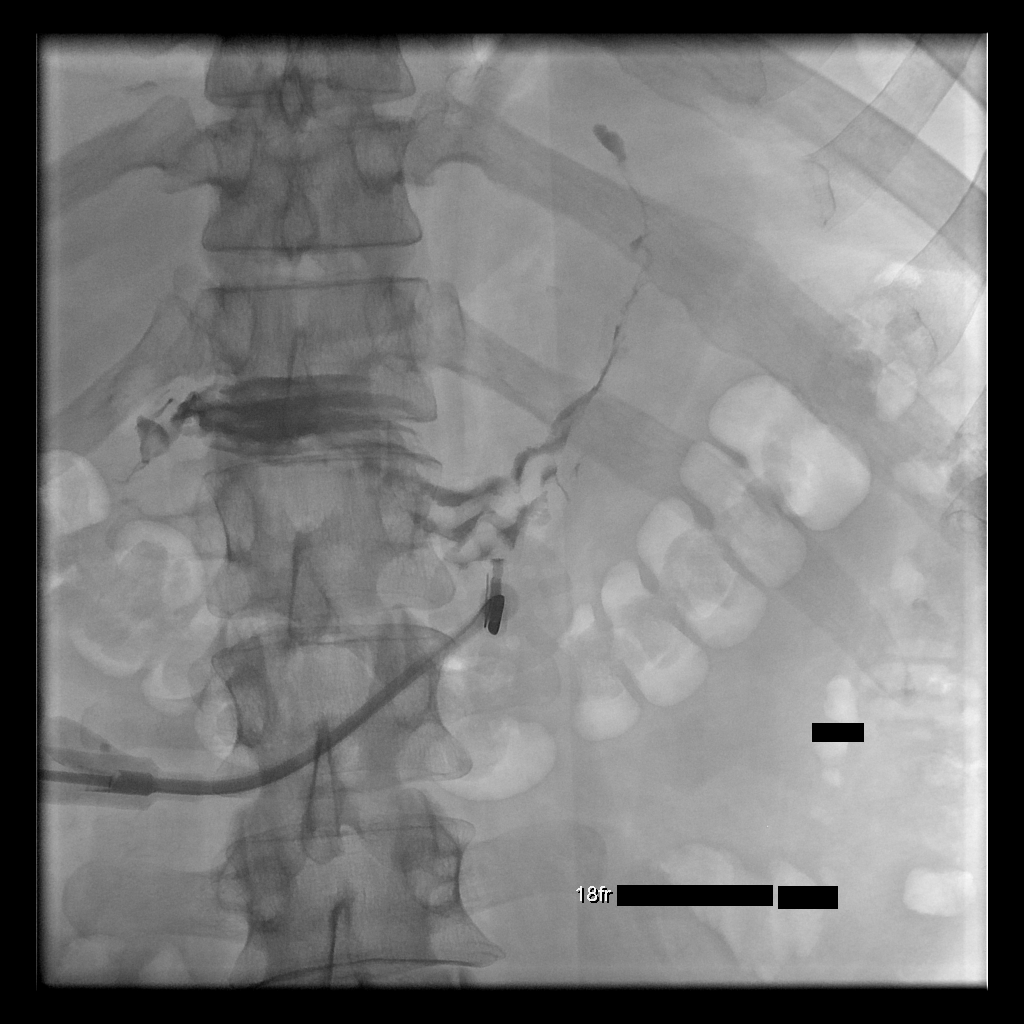

[9 of 9 positions shown; findings below may reference images not displayed]

MEDICATIONS:
None.

CONTRAST:  10 cc Omnipaque 300 - administered into the gastric lumen

FLUOROSCOPY TIME:  12 seconds (7 mGy)

COMPLICATIONS:
None immediate.

PROCEDURE:
The patient was positioned supine on fluoroscopy table.

The existing gastrostomy tube was removed intact.

Next, a new 18-Jeanlourdy Durso balloon inflatable gastrostomy tube was
inserted via the chronic gastrostomy tube track. The balloon was
inflated and disc was cinched. Contrast was injected and several
spot fluoroscopic images were obtained in various obliquities to
confirm appropriate intraluminal positioning. A dressing was
applied. The patient tolerated the procedure well without immediate
postprocedural complication.
IMPRESSION: Successful fluoroscopic guided replacement of a new 18-French
gastrostomy tube. The new gastrostomy tube is ready for immediate
use.

## 2020-10-23 MED ORDER — MUPIROCIN 2 % EX OINT
TOPICAL_OINTMENT | Freq: Two times a day (BID) | CUTANEOUS | Status: DC
  Administered 2020-10-31 – 2020-11-16 (×4): 1 via TOPICAL
  Filled 2020-10-23: qty 22

## 2020-10-23 MED ORDER — TRAMADOL HCL 50 MG PO TABS
50.0000 mg | ORAL_TABLET | Freq: Four times a day (QID) | ORAL | Status: DC | PRN
  Administered 2020-10-23 – 2020-11-23 (×81): 50 mg via ORAL
  Filled 2020-10-23 (×84): qty 1

## 2020-10-23 NOTE — TOC Progression Note (Addendum)
Transition of Care [facility]) - Progression Note    Patient Details  Name: Chase Caldwell MRN: [id number] Date of Birth: 01/29/1980  Transition of Care [facility]) CM/SW [place], RN Phone Number: 10/23/2020, 8:33 AM  Clinical Narrative:    CM spoke with Ebony Hail, [place] with [facility] in the area and asked that she review the patient for possible placement at any of the available SNF facilities in the state.  CM and MSW with DTP Team will continue to explore SNF options for admission.  At this time the patient does not have any available bed offers.  10/23/20 [facility] declined the patient for admission.  I left a message with [facility] and [facility] regarding admission.  10/23/2020 1315-  Debbie, CM with [facility] is willing to review the patient at this time and will call me back with any care questions.   Expected Discharge Plan: Long Term Nursing Home Barriers to Discharge: Ship broker, [place] ([facility])  Expected Discharge Plan and Services Expected Discharge Plan: [place]     Post Acute Care Choice: Nursing Home Living arrangements for the past 2 months: Single Family Home                                       Social Determinants of Health (SDOH) Interventions    Readmission Risk Interventions No flowsheet data found.
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
## 2020-10-23 NOTE — Plan of Care (Signed)
  Problem: Education: Goal: Knowledge of General Education information will improve Description: Including pain rating scale, medication(s)/side effects and non-pharmacologic comfort measures Outcome: Progressing   Problem: Health Behavior/Discharge Planning: Goal: Ability to manage health-related needs will improve Outcome: Progressing   Problem: Clinical Measurements: Goal: Ability to maintain clinical measurements within normal limits will improve Outcome: Progressing Goal: Will remain free from infection Outcome: Progressing Goal: Diagnostic test results will improve Outcome: Progressing Goal: Respiratory complications will improve Outcome: Progressing Goal: Cardiovascular complication will be avoided Outcome: Progressing   Problem: Nutrition: Goal: Adequate nutrition will be maintained Outcome: Progressing   Problem: Coping: Goal: Level of anxiety will decrease Outcome: Progressing   Problem: Elimination: Goal: Will not experience complications related to bowel motility Outcome: Progressing Goal: Will not experience complications related to urinary retention Outcome: Progressing   Problem: Pain Managment: Goal: General experience of comfort will improve Outcome: Progressing   Problem: Safety: Goal: Ability to remain free from injury will improve Outcome: Progressing   Problem: Skin Integrity: Goal: Risk for impaired skin integrity will decrease Outcome: Progressing   Problem: Education: Goal: Knowledge of disease or condition will improve Outcome: Progressing   Problem: Self-Care: Goal: Verbalization of feelings and concerns over difficulty with self-care will improve Outcome: Progressing

## 2020-10-24 NOTE — Progress Notes (Signed)
Patient seen and examined, 40/M with anoxic and traumatic brain injury with resultant spastic tetraplegia -Now awaiting long-term SNF  [name], MD

## 2020-10-25 NOTE — Progress Notes (Signed)
Patient seen and examined, 40/male with history of traumatic and anoxic encephalopathy with resultant spastic tetraplegia  -Tolerates diet and meds p.o.  -Remains medically stable with chronic deficits  -Awaiting long-term SNF   [name], MD

## 2020-10-26 LAB — RESP PANEL BY RT-PCR (FLU A&B, COVID) ARPGX2
Influenza A by PCR: NEGATIVE
Influenza B by PCR: NEGATIVE
SARS Coronavirus 2 by RT PCR: NEGATIVE

## 2020-10-26 MED ORDER — ASPIRIN 81 MG PO CHEW: 81.0000 mg | CHEWABLE_TABLET | Freq: Every day | ORAL | Status: AC

## 2020-10-26 MED ORDER — CLONAZEPAM 1 MG PO TABS: 1.0000 mg | ORAL_TABLET | Freq: Four times a day (QID) | ORAL | 1 refills | Status: DC

## 2020-10-26 MED ORDER — SERTRALINE HCL 50 MG PO TABS: 50.0000 mg | ORAL_TABLET | Freq: Every day | ORAL | Status: DC

## 2020-10-26 MED ORDER — NICOTINE 14 MG/24HR TD PT24: 14.0000 mg | MEDICATED_PATCH | TRANSDERMAL | 0 refills | Status: DC

## 2020-10-26 MED ORDER — QUETIAPINE FUMARATE 100 MG PO TABS: 100.0000 mg | ORAL_TABLET | Freq: Every day | ORAL | Status: DC

## 2020-10-26 MED ORDER — QUETIAPINE FUMARATE 50 MG PO TABS: 50.0000 mg | ORAL_TABLET | Freq: Every day | ORAL | Status: DC

## 2020-10-26 MED ORDER — TRAMADOL HCL 50 MG PO TABS: 50.0000 mg | ORAL_TABLET | Freq: Four times a day (QID) | ORAL | 0 refills | Status: DC | PRN

## 2020-10-26 MED ORDER — ACETAMINOPHEN 325 MG PO TABS: 650.0000 mg | ORAL_TABLET | ORAL | Status: AC | PRN

## 2020-10-26 NOTE — Progress Notes (Signed)
CSW spoke with Shirlee Limerick at [facility] to confirm bed offer - Shirlee Limerick will initiate insurance authorization from [place].  Madilyn Fireman, MSW, LCSW Transitions of Care  Clinical Social Worker II [phone]

## 2020-10-26 NOTE — TOC Progression Note (Addendum)
Transition of Care [facility]) - Progression Note    Patient Details  Name: Chase Caldwell MRN: [id number] Date of Birth: [birth year]  Transition of Care [facility]) CM/SW [place], RN Phone Number: 10/26/2020, 8:18 AM  Clinical Narrative:    Case management called and left a message with Dyane Dustman, admissions director regarding bed offer in the hub for accepted admission at [facility] or [facility].  CM and MSW with DTP Team will follow up regarding bed offer.  Lissa Merlin, NP is aware on bed offer and pending admission.  10/26/2020 0907 - CM called and spoke with Dyane Dustman, admissions DON and she states that [facility] are unable to offer a LTC bed at this time.  CM left a message with Jackelyn Poling, CM at [facility] for potential bed offer.  CM and MSW with DTP Team will continue to follow the patient for placement.     Expected Discharge Plan: Long Term Nursing Home Barriers to Discharge: Ship broker, [place] (PAS[facility])  Expected Discharge Plan and Services Expected Discharge Plan: [facility]     Post Acute Care Choice: Nursing Home Living arrangements for the past 2 months: Single Family Home                                       Social Determinants of Health (SDOH) Interventions    Readmission Risk Interventions No flowsheet data found.
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
## 2020-10-27 NOTE — Progress Notes (Signed)
CSW spoke with Shirlee Limerick at [facility] who states she has not received any updates from [place] regarding the insurance authorization request.  CSW spoke with staff member at [facility] to discuss authorization request - no authorization request was submitted yesterday. CSW submitted information and clinicals to [phone].  Madilyn Fireman, MSW, LCSW Transitions of Care  Clinical Social Worker II [phone]
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
## 2020-10-28 NOTE — Progress Notes (Addendum)
12:45pm: CSW spoke with Network engineer at [facility] in [place] to leave message for admissions and to obtain fax number - clinicals faxed to [phone].  CSW spoke with Network engineer at [facility] in [place] to leave message for admissions and to obtain fax number - clinicals faxed to [phone].  9:30am: CSW spoke with patient's wife Starla Link who states the [facility] policy in the chart is no longer active due to her quitting her job over a month ago. Jeri agreeable to have that policy information removed from the patient's chart. Starla Link is agreeable to forfeit the patient's disability income to the facility if placement is able to be obtained.  Madilyn Fireman, MSW, LCSW Transitions of Care  Clinical Social Worker II [phone]

## 2020-10-29 NOTE — Progress Notes (Signed)
CSW spoke with Helene Kelp at [facility] who states the facility does not have any LTC Medicaid beds at this time.  RN CM faxed patient's clinicals to Shary Key at [facility] in [place] for review.  Madilyn Fireman, MSW, LCSW Transitions of Care  Clinical Social Worker II [phone]
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
## 2020-10-30 MED ORDER — QUETIAPINE FUMARATE 200 MG PO TABS
200.0000 mg | ORAL_TABLET | Freq: Every day | ORAL | Status: DC
  Administered 2020-10-30 – 2020-11-22 (×24): 200 mg via ORAL
  Filled 2020-10-30 (×24): qty 1

## 2020-10-30 MED ORDER — DULOXETINE HCL 60 MG PO CPEP
90.0000 mg | ORAL_CAPSULE | Freq: Every day | ORAL | Status: DC
  Administered 2020-10-31 – 2020-11-23 (×24): 90 mg via ORAL
  Filled 2020-10-30 (×24): qty 1

## 2020-10-30 MED ORDER — SERTRALINE HCL 100 MG PO TABS
100.0000 mg | ORAL_TABLET | Freq: Every day | ORAL | Status: DC
  Administered 2020-10-30: 100 mg via ORAL
  Filled 2020-10-30: qty 1
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
## 2020-10-31 MED ORDER — OXYCODONE HCL 5 MG PO TABS
5.0000 mg | ORAL_TABLET | Freq: Once | ORAL | Status: AC | PRN
  Administered 2020-10-31: 5 mg via ORAL
  Filled 2020-10-31: qty 1
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
## 2020-11-02 DIAGNOSIS — R531 Weakness: Secondary | ICD-10-CM | POA: Diagnosis not present
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
## 2020-11-02 NOTE — TOC Progression Note (Signed)
Transition of Care [facility]) - Progression Note    Patient Details  Name: Chase Caldwell MRN: [id number] Date of Birth: [birth year]  [facility]) CM/SW [place], RN Phone Number: 11/02/2020, 11:09 AM  Clinical Narrative:    CM met with the patient and daughter at the bedside to discuss transitions of care to LTC facility.  At this time, there are no current bed offers.  CM and MSW will continue to explore options for admission.  CM called and left a message with following SNf facilities:  [facility] - spoke with Myriam Jacobson - re-faxed clinicals for review [facility] in [place] - left message with admissions [facility] in [place] -  left message with admissions  CM and MSW will continue to follow for SNF options for admission.   Expected Discharge Plan: Long Term Nursing Home Barriers to Discharge: [facility], [place] (PASRR)  Expected Discharge Plan and Services Expected Discharge Plan: [place]     Post Acute Care Choice: Nursing Home Living arrangements for the past 2 months: Single Family Home                                       Social Determinants of Health (SDOH) Interventions    Readmission Risk Interventions No flowsheet data found.

## 2020-11-03 DIAGNOSIS — R531 Weakness: Secondary | ICD-10-CM | POA: Diagnosis not present

## 2020-11-03 MED ORDER — MAGNESIUM HYDROXIDE 400 MG/5ML PO SUSP
30.0000 mL | Freq: Once | ORAL | Status: AC
  Administered 2020-11-03: 30 mL via ORAL
  Filled 2020-11-03: qty 30

## 2020-11-03 MED ORDER — DOCUSATE SODIUM 100 MG PO CAPS: 100.0000 mg | ORAL_CAPSULE | Freq: Two times a day (BID) | ORAL | 0 refills | Status: AC

## 2020-11-03 MED ORDER — QUETIAPINE FUMARATE 200 MG PO TABS: 200.0000 mg | ORAL_TABLET | Freq: Every day | ORAL | Status: DC

## 2020-11-03 MED ORDER — DOCUSATE SODIUM 100 MG PO CAPS
100.0000 mg | ORAL_CAPSULE | Freq: Two times a day (BID) | ORAL | Status: DC
  Administered 2020-11-03 – 2020-11-23 (×35): 100 mg via ORAL
  Filled 2020-11-03 (×39): qty 1

## 2020-11-03 MED ORDER — POLYETHYLENE GLYCOL 3350 17 G PO PACK
17.0000 g | PACK | Freq: Every day | ORAL | Status: DC
  Administered 2020-11-06 – 2020-11-19 (×11): 17 g via ORAL
  Filled 2020-11-03 (×17): qty 1

## 2020-11-03 MED ORDER — POLYETHYLENE GLYCOL 3350 17 G PO PACK: 17.0000 g | PACK | Freq: Every day | ORAL | 0 refills | Status: DC

## 2020-11-03 MED ORDER — DULOXETINE HCL 30 MG PO CPEP: 90.0000 mg | ORAL_CAPSULE | Freq: Every day | ORAL | 3 refills | Status: DC

## 2020-11-03 NOTE — TOC Progression Note (Signed)
Transition of Care [facility]) - Progression Note    Patient Details  Name: Chase Caldwell MRN: [id number] Date of Birth: 1980-04-10  Transition of Care [facility]) CM/SW [place], RN Phone Number: 11/03/2020, 8:37 AM  Clinical Narrative:    CM spoke with Myriam Jacobson, [place] in admissions at [facility] in [place] and the facility was unable to offer an admission bed to the patient at this time due to staffing constraints.  CM and MSW with DTP Team will continue to explore options for LTC admission.   Expected Discharge Plan: Long Term Nursing Home Barriers to Discharge: [facility], [place] (PASRR)  Expected Discharge Plan and Services Expected Discharge Plan: [place]     Post Acute Care Choice: Nursing Home Living arrangements for the past 2 months: Single Family Home                                       Social Determinants of Health (SDOH) Interventions    Readmission Risk Interventions No flowsheet data found.

## 2020-11-03 NOTE — Progress Notes (Signed)
CSW sent patient's clinicals to Alma Friendly at [facility] for review.  Madilyn Fireman, MSW, LCSW Transitions of Care  Clinical Social Worker II [phone]
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
## 2020-11-04 DIAGNOSIS — R531 Weakness: Secondary | ICD-10-CM | POA: Diagnosis not present
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
## 2020-11-04 NOTE — Progress Notes (Signed)
CSW sent clinicals to Jay at [facility] for review.  Madilyn Fireman, MSW, LCSW Transitions of Care  Clinical Social Worker II [phone]

## 2020-11-05 DIAGNOSIS — G8250 Quadriplegia, unspecified: Secondary | ICD-10-CM | POA: Diagnosis not present

## 2020-11-05 DIAGNOSIS — S069X9S Unspecified intracranial injury with loss of consciousness of unspecified duration, sequela: Secondary | ICD-10-CM | POA: Diagnosis not present

## 2020-11-05 DIAGNOSIS — F319 Bipolar disorder, unspecified: Secondary | ICD-10-CM | POA: Diagnosis not present

## 2020-11-05 NOTE — Plan of Care (Signed)
  Problem: Education: Goal: Knowledge of General Education information will improve Description: Including pain rating scale, medication(s)/side effects and non-pharmacologic comfort measures Outcome: Progressing   Problem: Health Behavior/Discharge Planning: Goal: Ability to manage health-related needs will improve Outcome: Progressing   Problem: Clinical Measurements: Goal: Ability to maintain clinical measurements within normal limits will improve Outcome: Progressing Goal: Will remain free from infection Outcome: Progressing Goal: Diagnostic test results will improve Outcome: Progressing Goal: Respiratory complications will improve Outcome: Progressing Goal: Cardiovascular complication will be avoided Outcome: Progressing   Problem: Activity: Goal: Risk for activity intolerance will decrease Outcome: Progressing   Problem: Nutrition: Goal: Adequate nutrition will be maintained Outcome: Progressing   Problem: Coping: Goal: Level of anxiety will decrease Outcome: Progressing   Problem: Elimination: Goal: Will not experience complications related to bowel motility Outcome: Progressing Goal: Will not experience complications related to urinary retention Outcome: Progressing   Problem: Pain Managment: Goal: General experience of comfort will improve Outcome: Progressing   Problem: Safety: Goal: Ability to remain free from injury will improve Outcome: Progressing   Problem: Skin Integrity: Goal: Risk for impaired skin integrity will decrease Outcome: Progressing   Problem: Education: Goal: Knowledge of disease or condition will improve Outcome: Progressing Goal: Knowledge of secondary prevention will improve Outcome: Progressing Goal: Knowledge of patient specific risk factors addressed and post discharge goals established will improve Outcome: Progressing   Problem: Self-Care: Goal: Verbalization of feelings and concerns over difficulty with self-care will  improve Outcome: Progressing   Problem: Nutrition: Goal: Risk of aspiration will decrease Outcome: Progressing Goal: Dietary intake will improve Outcome: Progressing   Problem: Ischemic Stroke/TIA Tissue Perfusion: Goal: Complications of ischemic stroke/TIA will be minimized Outcome: Progressing
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
## 2020-11-05 NOTE — Progress Notes (Signed)
CSW spoke with Whitney at [facility] who states clinicals are still under review at this time.  Madilyn Fireman, MSW, LCSW Transitions of Care  Clinical Social Worker II [phone]

## 2020-11-06 DIAGNOSIS — R531 Weakness: Secondary | ICD-10-CM | POA: Diagnosis not present

## 2020-11-06 LAB — CBC
HCT: 38.2 % — ABNORMAL LOW (ref 39.0–52.0)
Hemoglobin: 12.8 g/dL — ABNORMAL LOW (ref 13.0–17.0)
MCH: 29.7 pg (ref 26.0–34.0)
MCHC: 33.5 g/dL (ref 30.0–36.0)
MCV: 88.6 fL (ref 80.0–100.0)
Platelets: 265 10*3/uL (ref 150–400)
RBC: 4.31 MIL/uL (ref 4.22–5.81)
RDW: 13.2 % (ref 11.5–15.5)
WBC: 5.7 10*3/uL (ref 4.0–10.5)
nRBC: 0 % (ref 0.0–0.2)

## 2020-11-06 LAB — BASIC METABOLIC PANEL
Anion gap: 7 (ref 5–15)
BUN: 13 mg/dL (ref 6–20)
CO2: 26 mmol/L (ref 22–32)
Calcium: 8.7 mg/dL — ABNORMAL LOW (ref 8.9–10.3)
Chloride: 100 mmol/L (ref 98–111)
Creatinine, Ser: 0.59 mg/dL — ABNORMAL LOW (ref 0.61–1.24)
GFR, Estimated: >60 mL/min (ref >60)
Glucose, Bld: 89 mg/dL (ref 70–99)
Potassium: 3.6 mmol/L (ref 3.5–5.1)
Sodium: 133 mmol/L — ABNORMAL LOW (ref 135–145)
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
## 2020-11-06 NOTE — TOC Progression Note (Signed)
Transition of Care [facility]) - Progression Note    Patient Details  Name: Chase Caldwell MRN: [id number] Date of Birth: 09/25/1980  Transition of Care [facility]) CM/SW [place], RN Phone Number: 11/06/2020, 1:05 PM  Clinical Narrative:    CM and MSW with DTP will continue to explore options for LTC placement for this patient.  Clinicals were faxed to [facility] in [place], [place] [phone]   Expected Discharge Plan: [place] Barriers to Discharge: Insurance Authorization, [place] ([place])  Expected Discharge Plan and Services Expected Discharge Plan: [place]     Post Acute Care Choice: Nursing Home Living arrangements for the past 2 months: Single Family Home                                       Social Determinants of Health (SDOH) Interventions    Readmission Risk Interventions No flowsheet data found.
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
## 2020-11-08 LAB — GLUCOSE, CAPILLARY: Glucose-Capillary: 89 mg/dL (ref 70–99)
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
## 2020-11-09 DIAGNOSIS — R531 Weakness: Secondary | ICD-10-CM | POA: Diagnosis not present

## 2020-11-09 LAB — GLUCOSE, CAPILLARY: Glucose-Capillary: 89 mg/dL (ref 70–99)

## 2020-11-09 NOTE — Plan of Care (Signed)
  Problem: Education: Goal: Knowledge of General Education information will improve Description: Including pain rating scale, medication(s)/side effects and non-pharmacologic comfort measures Outcome: Progressing   Problem: Health Behavior/Discharge Planning: Goal: Ability to manage health-related needs will improve Outcome: Progressing   Problem: Clinical Measurements: Goal: Ability to maintain clinical measurements within normal limits will improve Outcome: Progressing Goal: Will remain free from infection Outcome: Progressing Goal: Diagnostic test results will improve Outcome: Progressing Goal: Respiratory complications will improve Outcome: Progressing Goal: Cardiovascular complication will be avoided Outcome: Progressing   Problem: Nutrition: Goal: Adequate nutrition will be maintained Outcome: Progressing   Problem: Coping: Goal: Level of anxiety will decrease Outcome: Progressing   Problem: Elimination: Goal: Will not experience complications related to bowel motility Outcome: Progressing Goal: Will not experience complications related to urinary retention Outcome: Progressing   Problem: Pain Managment: Goal: General experience of comfort will improve Outcome: Progressing   Problem: Safety: Goal: Ability to remain free from injury will improve Outcome: Progressing   Problem: Skin Integrity: Goal: Risk for impaired skin integrity will decrease Outcome: Progressing   Problem: Education: Goal: Knowledge of disease or condition will improve Outcome: Progressing Goal: Knowledge of secondary prevention will improve Outcome: Progressing Goal: Knowledge of patient specific risk factors addressed and post discharge goals established will improve Outcome: Progressing   Problem: Self-Care: Goal: Verbalization of feelings and concerns over difficulty with self-care will improve Outcome: Progressing   Problem: Nutrition: Goal: Risk of aspiration will  decrease Outcome: Progressing Goal: Dietary intake will improve Outcome: Progressing   Problem: Ischemic Stroke/TIA Tissue Perfusion: Goal: Complications of ischemic stroke/TIA will be minimized Outcome: Progressing
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
## 2020-11-09 NOTE — TOC Progression Note (Addendum)
Transition of Care [facility]) - Progression Note    Patient Details  Name: Chase Caldwell MRN: [id number] Date of Birth: 1980/10/19  Transition of Care [facility]) CM/SW [place], RN Phone Number: 11/09/2020, 11:46 AM  Clinical Narrative:    CM and MSW with DTP Team continue to explore options at SNF facilities for LTC placement.  CM called and left a message with following facilities for placement: [facility] in [place], [place] - left message [facility] [facility] - No beds available [facility] - left message [facility] - left message [place] - No beds available due to COVID outbreak  CM left a message with Rande Brunt to check for available DTP beds at available SNF facilities.  CM and MSW with DTP will continue to follow the patient for LTC placement.    Expected Discharge Plan: Long Term Nursing Home Barriers to Discharge: Ship broker, [place] ([facility])  Expected Discharge Plan and Services Expected Discharge Plan: [place]     Post Acute Care Choice: Nursing Home Living arrangements for the past 2 months: Single Family Home                                       Social Determinants of Health (SDOH) Interventions    Readmission Risk Interventions No flowsheet data found.

## 2020-11-10 DIAGNOSIS — R531 Weakness: Secondary | ICD-10-CM | POA: Diagnosis not present
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
## 2020-11-10 NOTE — Plan of Care (Signed)
  Problem: Health Behavior/Discharge Planning: Goal: Ability to manage health-related needs will improve Outcome: Progressing   Problem: Clinical Measurements: Goal: Ability to maintain clinical measurements within normal limits will improve Outcome: Progressing Goal: Will remain free from infection Outcome: Progressing Goal: Diagnostic test results will improve Outcome: Progressing Goal: Respiratory complications will improve Outcome: Progressing Goal: Cardiovascular complication will be avoided Outcome: Progressing   Problem: Activity: Goal: Risk for activity intolerance will decrease Outcome: Progressing   Problem: Nutrition: Goal: Adequate nutrition will be maintained Outcome: Progressing   Problem: Coping: Goal: Level of anxiety will decrease Outcome: Progressing   Problem: Elimination: Goal: Will not experience complications related to bowel motility Outcome: Progressing Goal: Will not experience complications related to urinary retention Outcome: Progressing   Problem: Pain Managment: Goal: General experience of comfort will improve Outcome: Progressing   Problem: Safety: Goal: Ability to remain free from injury will improve Outcome: Progressing   Problem: Skin Integrity: Goal: Risk for impaired skin integrity will decrease Outcome: Progressing   Problem: Education: Goal: Knowledge of disease or condition will improve Outcome: Progressing Goal: Knowledge of secondary prevention will improve Outcome: Progressing Goal: Knowledge of patient specific risk factors addressed and post discharge goals established will improve Outcome: Progressing   Problem: Self-Care: Goal: Verbalization of feelings and concerns over difficulty with self-care will improve Outcome: Progressing   Problem: Nutrition: Goal: Risk of aspiration will decrease Outcome: Progressing Goal: Dietary intake will improve Outcome: Progressing   Problem: Ischemic Stroke/TIA Tissue  Perfusion: Goal: Complications of ischemic stroke/TIA will be minimized Outcome: Progressing

## 2020-11-11 ENCOUNTER — Encounter: Payer: Medicaid Other | Admitting: Physical Medicine & Rehabilitation

## 2020-11-11 ENCOUNTER — Encounter (HOSPITAL_COMMUNITY): Payer: Self-pay | Admitting: Internal Medicine

## 2020-11-11 DIAGNOSIS — R531 Weakness: Secondary | ICD-10-CM | POA: Diagnosis not present

## 2020-11-11 DIAGNOSIS — F319 Bipolar disorder, unspecified: Secondary | ICD-10-CM | POA: Diagnosis not present

## 2020-11-11 DIAGNOSIS — S069X0S Unspecified intracranial injury without loss of consciousness, sequela: Secondary | ICD-10-CM
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
## 2020-11-11 NOTE — TOC Progression Note (Addendum)
Transition of Care [facility]) - Progression Note    Patient Details  Name: Chase Caldwell MRN: [id number] Date of Birth: April 19, 1980  Transition of Care [facility]) CM/SW [place], RN Phone Number: 11/11/2020, 10:27 AM  Clinical Narrative:    CM and MSW with DTP Team continue to explore options for LTC nursing home placement.  There are no bed offers at this time.  [facility] in [place], [place] - left message with admission [place], [place] with admissions will review and look for placement options  I spoke with Audelia Acton, CM at [place] facilities admission and patient's clinicals placed in the [facility] and rehabilitation and he will review at this time and will follow up.  I called and spoke with the patient's wife, Chase Caldwell on the phone and updated her that no facilities have offered a bed at this time but we will continue to explore avenues for admission.  CM and MSW with DTP Team continue to follow the patient for LTC placement options.   Expected Discharge Plan: Long Term Nursing Home Barriers to Discharge: [employer], [place] ([facility])  Expected Discharge Plan and Services Expected Discharge Plan: [place]     Post Acute Care Choice: Nursing Home Living arrangements for the past 2 months: Single Family Home                                       Social Determinants of Health (SDOH) Interventions    Readmission Risk Interventions No flowsheet data found.

## 2020-11-11 NOTE — Plan of Care (Signed)
  Problem: Health Behavior/Discharge Planning: Goal: Ability to manage health-related needs will improve Outcome: Progressing   Problem: Clinical Measurements: Goal: Ability to maintain clinical measurements within normal limits will improve Outcome: Progressing Goal: Will remain free from infection Outcome: Progressing Goal: Diagnostic test results will improve Outcome: Progressing Goal: Respiratory complications will improve Outcome: Progressing Goal: Cardiovascular complication will be avoided Outcome: Progressing   Problem: Activity: Goal: Risk for activity intolerance will decrease Outcome: Progressing   Problem: Nutrition: Goal: Adequate nutrition will be maintained Outcome: Progressing   Problem: Coping: Goal: Level of anxiety will decrease Outcome: Progressing   Problem: Elimination: Goal: Will not experience complications related to bowel motility Outcome: Progressing Goal: Will not experience complications related to urinary retention Outcome: Progressing   Problem: Pain Managment: Goal: General experience of comfort will improve Outcome: Progressing   Problem: Safety: Goal: Ability to remain free from injury will improve Outcome: Progressing   Problem: Skin Integrity: Goal: Risk for impaired skin integrity will decrease Outcome: Progressing   Problem: Education: Goal: Knowledge of disease or condition will improve Outcome: Progressing Goal: Knowledge of secondary prevention will improve Outcome: Progressing Goal: Knowledge of patient specific risk factors addressed and post discharge goals established will improve Outcome: Progressing   Problem: Self-Care: Goal: Verbalization of feelings and concerns over difficulty with self-care will improve Outcome: Progressing   Problem: Nutrition: Goal: Risk of aspiration will decrease Outcome: Progressing Goal: Dietary intake will improve Outcome: Progressing   Problem: Ischemic Stroke/TIA Tissue  Perfusion: Goal: Complications of ischemic stroke/TIA will be minimized Outcome: Progressing
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
## 2020-11-11 NOTE — Progress Notes (Signed)
Patient requested wheelchair from home be brought to the hospital. This RN called patients wife and she agreed to transport chair to this facility tomorrow, 11/12/2020

## 2020-11-12 DIAGNOSIS — R531 Weakness: Secondary | ICD-10-CM | POA: Diagnosis not present
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
## 2020-11-13 DIAGNOSIS — R531 Weakness: Secondary | ICD-10-CM | POA: Diagnosis not present
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
## 2020-11-14 DIAGNOSIS — R531 Weakness: Secondary | ICD-10-CM | POA: Diagnosis not present

## 2020-11-14 DIAGNOSIS — F319 Bipolar disorder, unspecified: Secondary | ICD-10-CM | POA: Diagnosis not present

## 2020-11-14 DIAGNOSIS — K219 Gastro-esophageal reflux disease without esophagitis: Secondary | ICD-10-CM | POA: Diagnosis not present

## 2020-11-14 DIAGNOSIS — S069X0S Unspecified intracranial injury without loss of consciousness, sequela: Secondary | ICD-10-CM | POA: Diagnosis not present

## 2020-11-14 LAB — BASIC METABOLIC PANEL
Anion gap: 7 (ref 5–15)
BUN: 13 mg/dL (ref 6–20)
CO2: 26 mmol/L (ref 22–32)
Calcium: 8.6 mg/dL — ABNORMAL LOW (ref 8.9–10.3)
Chloride: 100 mmol/L (ref 98–111)
Creatinine, Ser: 0.71 mg/dL (ref 0.61–1.24)
GFR, Estimated: >60 mL/min (ref >60)
Glucose, Bld: 97 mg/dL (ref 70–99)
Potassium: 3.6 mmol/L (ref 3.5–5.1)
Sodium: 133 mmol/L — ABNORMAL LOW (ref 135–145)

## 2020-11-14 LAB — CBC WITH DIFFERENTIAL/PLATELET
Abs Immature Granulocytes: 0.02 10*3/uL (ref 0.00–0.07)
Basophils Absolute: 0 10*3/uL (ref 0.0–0.1)
Basophils Relative: 1 %
Eosinophils Absolute: 0.2 10*3/uL (ref 0.0–0.5)
Eosinophils Relative: 3 %
HCT: 36 % — ABNORMAL LOW (ref 39.0–52.0)
Hemoglobin: 11.8 g/dL — ABNORMAL LOW (ref 13.0–17.0)
Immature Granulocytes: 0 %
Lymphocytes Relative: 34 %
Lymphs Abs: 2.3 10*3/uL (ref 0.7–4.0)
MCH: 29.1 pg (ref 26.0–34.0)
MCHC: 32.8 g/dL (ref 30.0–36.0)
MCV: 88.7 fL (ref 80.0–100.0)
Monocytes Absolute: 0.4 10*3/uL (ref 0.1–1.0)
Monocytes Relative: 6 %
Neutro Abs: 3.8 10*3/uL (ref 1.7–7.7)
Neutrophils Relative %: 56 %
Platelets: 247 10*3/uL (ref 150–400)
RBC: 4.06 MIL/uL — ABNORMAL LOW (ref 4.22–5.81)
RDW: 13.1 % (ref 11.5–15.5)
WBC: 6.7 10*3/uL (ref 4.0–10.5)
nRBC: 0 % (ref 0.0–0.2)
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
## 2020-11-15 DIAGNOSIS — R531 Weakness: Secondary | ICD-10-CM | POA: Diagnosis not present

## 2020-11-15 DIAGNOSIS — S069X0S Unspecified intracranial injury without loss of consciousness, sequela: Secondary | ICD-10-CM | POA: Diagnosis not present

## 2020-11-15 DIAGNOSIS — K219 Gastro-esophageal reflux disease without esophagitis: Secondary | ICD-10-CM | POA: Diagnosis not present

## 2020-11-15 DIAGNOSIS — F319 Bipolar disorder, unspecified: Secondary | ICD-10-CM | POA: Diagnosis not present

## 2020-11-15 IMAGING — MR MR HEAD WO/W CM
14 of 16 series · 42 of 48 positions shown · IV contrast (gadavist)
Comparison: 06/18/2019.  07/30/2019.

CLINICAL DATA: Abnormal basal ganglia seen on previous study of
07/30/2019, felt secondary to hypoxic ischemic injury.

EXAM:
MRI HEAD WITHOUT AND WITH CONTRAST
TECHNIQUE: Multiplanar, multiecho pulse sequences of the brain and surrounding
structures were obtained without and with intravenous contrast.
CONTRAST:  10mL GADAVIST GADOBUTROL 1 MMOL/ML IV SOLN

[Series 5: DWI · axial · 3.0mm · 0.92mm/px · z∈[-103,+47]mm · 7 of 108 slices shown (1 of 4)]
[im 1/108]
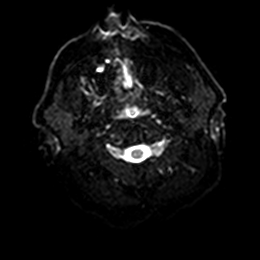
[im 18/108]
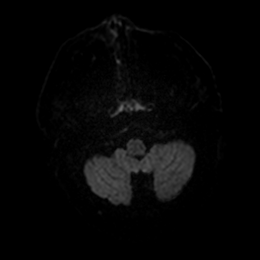
[im 36/108]
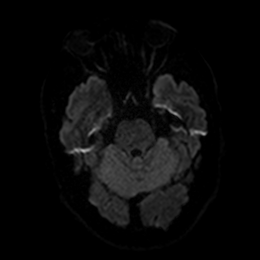
[im 54/108]
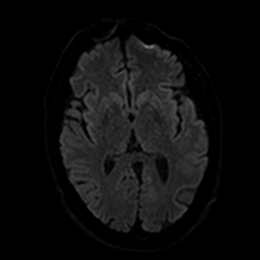
[im 72/108]
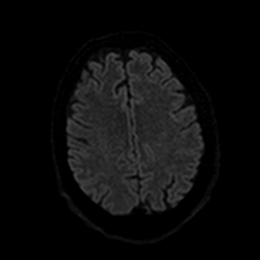
[im 90/108]
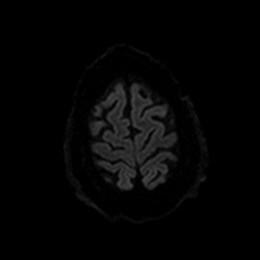
[im 108/108]
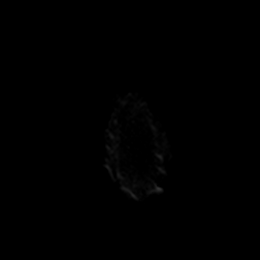

[Series 6: DWI · axial · 3.0mm · 0.92mm/px · z∈[-103,+47]mm · 3 of 52 slices shown (2 of 4)]
[im 1/52]
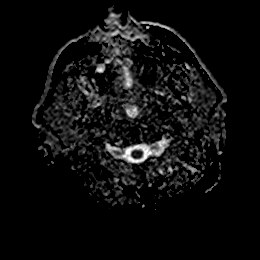
[im 26/52]
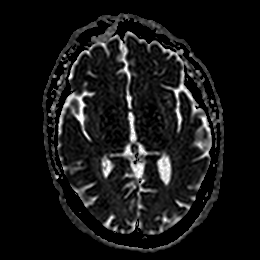
[im 52/52]
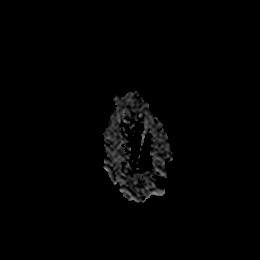

[Series 7: DWI · coronal · 4.0mm · 0.88mm/px · 5 of 84 slices shown (3 of 4)]
[im 1/84]
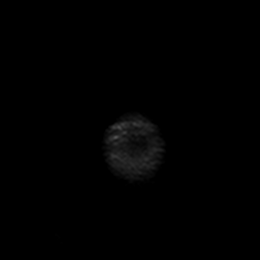
[im 21/84]
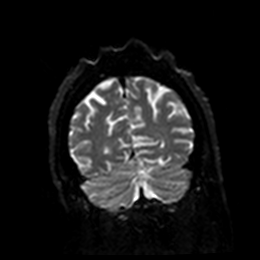
[im 42/84]
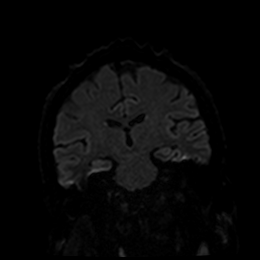
[im 63/84]
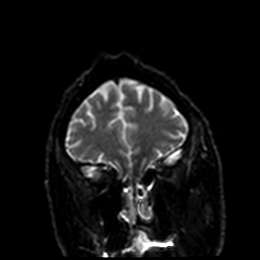
[im 84/84]
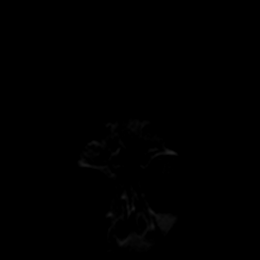

[Series 8: DWI · coronal · 4.0mm · 0.88mm/px · 3 of 41 slices shown (4 of 4)]
[im 1/41]
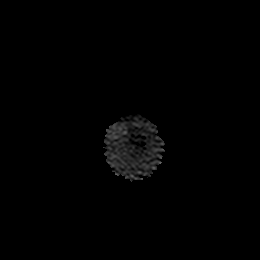
[im 21/41]
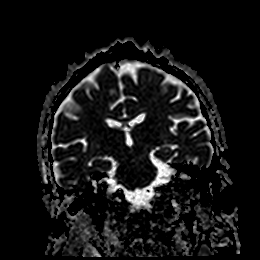
[im 41/41]
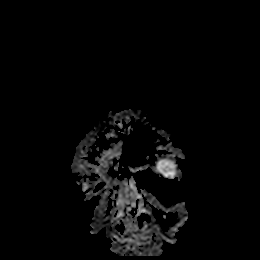

[Series 9: T1 · sagittal · 5.0mm · 0.75mm/px · 2 of 27 slices shown]
[im 1/27]
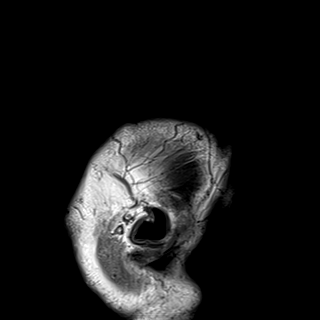
[im 27/27]
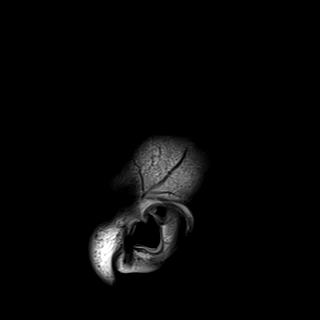

[Series 10: T2 · axial · 5.0mm · 0.75mm/px · z∈[-102,+47]mm · 2 of 27 slices shown]
[im 1/27]
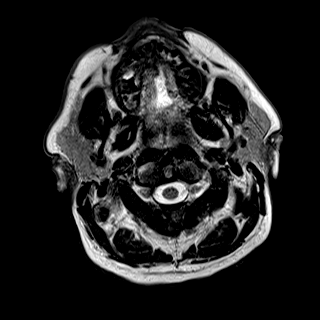
[im 27/27]
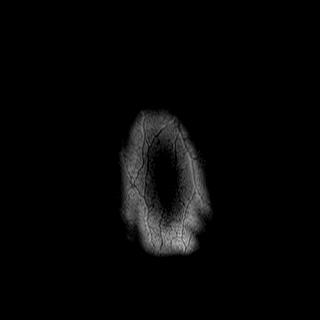

[Series 11: mag_images · axial · 3.0mm · 0.94mm/px · z∈[-99,+47]mm · 3 of 52 slices shown]
[im 1/52]
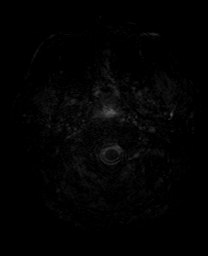
[im 26/52]
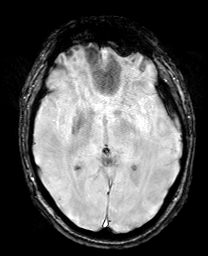
[im 52/52]
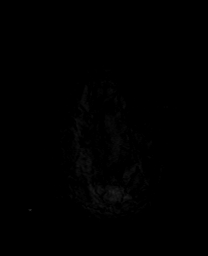

[Series 12: pha_images · axial · 3.0mm · 0.94mm/px · z∈[-99,+47]mm · 3 of 52 slices shown]
[im 1/52]
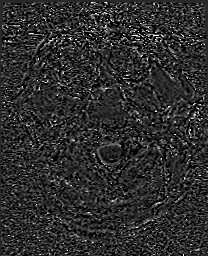
[im 26/52]
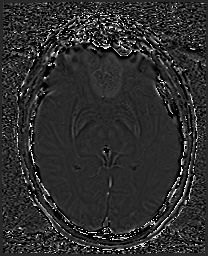
[im 52/52]
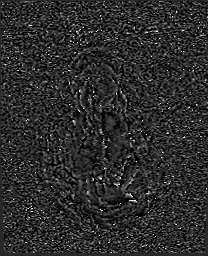

[Series 13: swi_images · axial · 3.0mm · 0.94mm/px · z∈[-99,+47]mm · 3 of 52 slices shown]
[im 1/52]
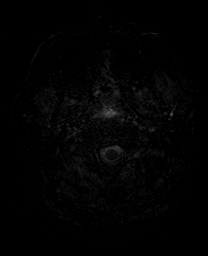
[im 26/52]
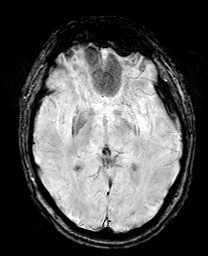
[im 52/52]
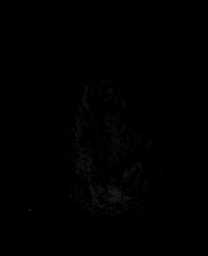

[Series 14: mip_images(sw) · axial · 24.0mm · 0.94mm/px · z∈[-89,+37]mm · 3 of 45 slices shown]
[im 1/45]
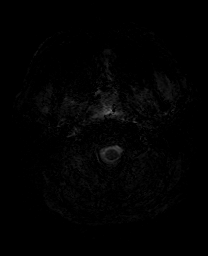
[im 23/45]
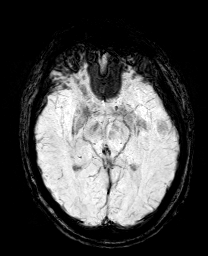
[im 45/45]
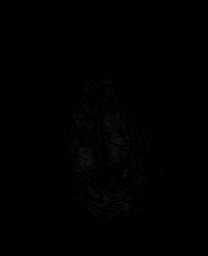

[Series 15: FLAIR · axial · 5.0mm · 0.47mm/px · z∈[-100,+49]mm · 2 of 27 slices shown]
[im 1/27]
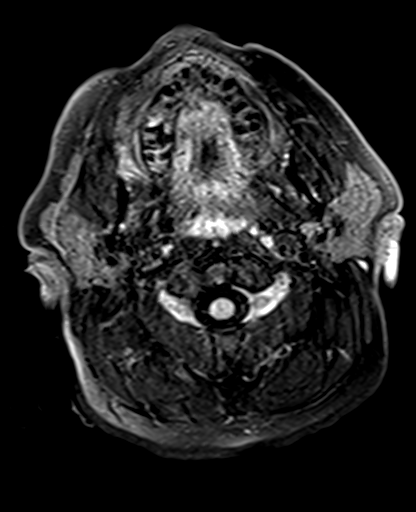
[im 27/27]
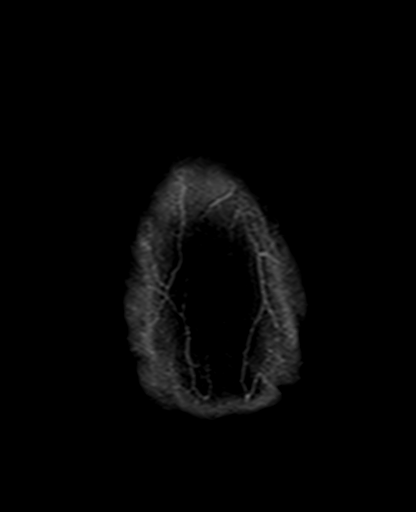

[Series 17: T2 post-contrast · coronal · 5.0mm · 0.72mm/px · 2 of 35 slices shown]
[im 1/35]
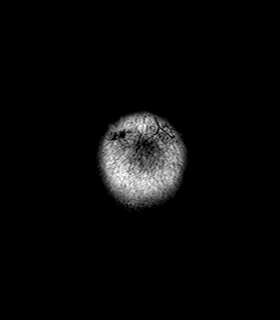
[im 35/35]
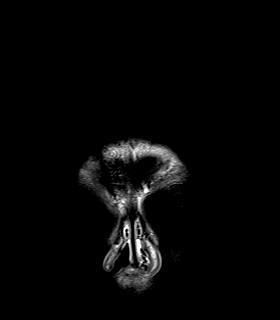

[Series 19: T1 post-contrast · coronal · 5.0mm · 0.43mm/px · 2 of 35 slices shown (1 of 2)]
[im 1/35]
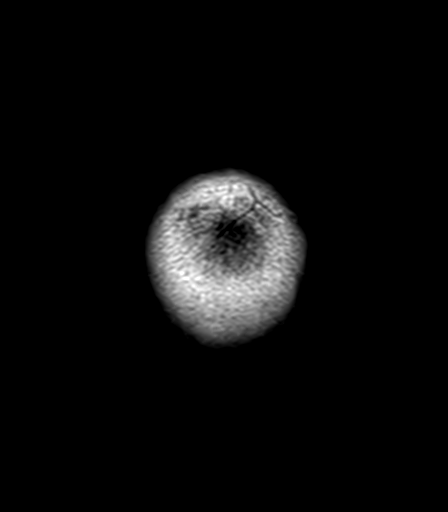
[im 35/35]
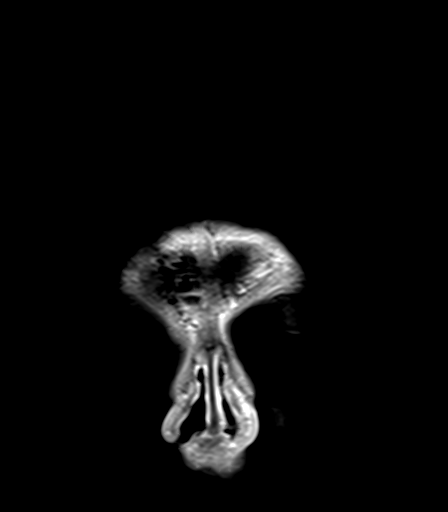

[Series 20: T1 post-contrast · sagittal · 5.0mm · 0.75mm/px · 2 of 27 slices shown (2 of 2)]
[im 1/27]
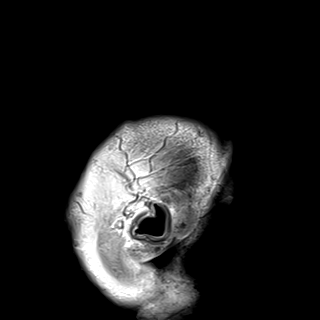
[im 27/27]
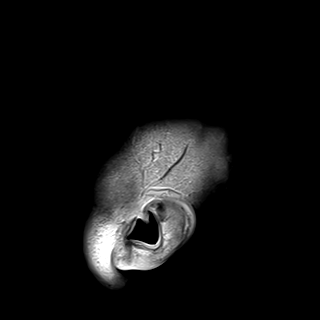

[42 of 48 positions shown; findings below may reference images not displayed]

FINDINGS: Brain: No evidence of new acute or subacute insult. Subacute
infarction of the corpus striatum with evolutionary changes since
the previous study, consistent with hypoxic ischemic injury. No
widespread cortical insult. Elsewhere, no focal brain abnormality is
seen. No hemorrhage, hydrocephalus or extra-axial collection.

Vascular: Major vessels at the base of the brain show flow.

Skull and upper cervical spine: Negative

Sinuses/Orbits: Clear except for mild mucosal thickening and a small
amount of fluid in the left maxillary sinus. Orbits negative.

Other: None
IMPRESSION: No new acute finding. Evolutionary changes of previously seen acute
infarction affecting the corpus striatum bilaterally, presumed
secondary to hypoxic ischemic injury.

## 2020-11-15 MED ORDER — INFLUENZA VAC SPLIT QUAD 0.5 ML IM SUSY
0.5000 mL | PREFILLED_SYRINGE | INTRAMUSCULAR | Status: AC
  Administered 2020-11-16: 0.5 mL via INTRAMUSCULAR
  Filled 2020-11-15: qty 0.5

## 2020-11-15 MED ORDER — ORAL CARE MOUTH RINSE
15.0000 mL | Freq: Two times a day (BID) | OROMUCOSAL | Status: DC
  Administered 2020-11-15 – 2020-11-23 (×14): 15 mL via OROMUCOSAL
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
## 2020-11-16 DIAGNOSIS — K219 Gastro-esophageal reflux disease without esophagitis: Secondary | ICD-10-CM | POA: Diagnosis not present

## 2020-11-16 DIAGNOSIS — S069X0S Unspecified intracranial injury without loss of consciousness, sequela: Secondary | ICD-10-CM | POA: Diagnosis not present

## 2020-11-16 DIAGNOSIS — F319 Bipolar disorder, unspecified: Secondary | ICD-10-CM | POA: Diagnosis not present

## 2020-11-16 DIAGNOSIS — R531 Weakness: Secondary | ICD-10-CM | POA: Diagnosis not present

## 2020-11-16 NOTE — TOC Progression Note (Addendum)
Transition of Care [facility]) - Progression Note    Patient Details  Name: Chase Caldwell MRN: [id number] Date of Birth: 09-29-1980  Transition of Care [facility]) CM/SW [place], RN Phone Number: 11/16/2020, 7:32 AM  Clinical Narrative:    Case management spoke with Audelia Acton, CM at [facility] facilities and he was unable to offer a LTC bed to the patient at this time.  CM and MSW with DTP Team will continue to explore options for SNF placement.  CM spoke with Kyung Rudd, admissions director with [facility] and asked for assistance with placement for DTP LTC Medicaid bed.   Expected Discharge Plan: Long Term Nursing Home Barriers to Discharge: Ship broker, [place] (PASRR)  Expected Discharge Plan and Services Expected Discharge Plan: [place]     Post Acute Care Choice: Nursing Home Living arrangements for the past 2 months: Single Family Home                                       Social Determinants of Health (SDOH) Interventions    Readmission Risk Interventions No flowsheet data found.
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
## 2020-11-17 DIAGNOSIS — R531 Weakness: Secondary | ICD-10-CM | POA: Diagnosis not present

## 2020-11-17 MED ORDER — CARBAMIDE PEROXIDE 6.5 % OT SOLN
5.0000 [drp] | Freq: Two times a day (BID) | OTIC | Status: AC
  Administered 2020-11-17 – 2020-11-20 (×7): 5 [drp] via OTIC
  Filled 2020-11-17: qty 15

## 2020-11-17 NOTE — Progress Notes (Signed)
CSW spoke with Freda Munro of [employer] and [facility] is agreeable to review clinicals.  CSW spoke with Martin Majestic at [facility] who sent clinicals to [facility] for review.  Madilyn Fireman, MSW, LCSW Transitions of Care  Clinical Social Worker II [phone]

## 2020-11-17 NOTE — TOC Progression Note (Addendum)
Transition of Care [facility]) - Progression Note    Patient Details  Name: Chase Caldwell MRN: [id number] Date of Birth: [birth year]  Transition of Care [facility]) CM/SW [place], RN Phone Number: 11/17/2020, 9:03 AM  Clinical Narrative:    Case management spoke with Lenna Sciara, CM with [facility] and the facility has open availability for male LTC bed.  Clinicals were sent in the hub for review for admission.  11/17/2020 3094 - CM spoke with Burgess Estelle, CM at [facility] and the facility has 4 open LTC admission beds - she will review the patient's clinicals and follow up with DTP Team.  CM and MSW with  DTP Team will continue to follow the patient for LTC placement.  11/17/2020 1036 - CM spoke with Remus Blake, admissions director at [facility] and the facility is unable to offer a bed to the patient.  11/17/2020 1121 - CM spoke with Kia, Admissions director at [facility] facility [phone] and she is going to reach the regional director at the facilities to return my call regarding need for LTC [facility] placement.  11/17/20 1208 - CM spoke with Winnifred Friar, CM at [facility] and she has agreed to review the patient's clinicals since the facility has available open LTC beds at this time.   Expected Discharge Plan: Long Term Nursing Home Barriers to Discharge: Ship broker, [place] (PASRR)  Expected Discharge Plan and Services Expected Discharge Plan: [place]     Post Acute Care Choice: Nursing Home Living arrangements for the past 2 months: Single Family Home                                       Social Determinants of Health (SDOH) Interventions    Readmission Risk Interventions No flowsheet data found.
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
## 2020-11-18 DIAGNOSIS — R531 Weakness: Secondary | ICD-10-CM | POA: Diagnosis not present

## 2020-11-18 NOTE — Progress Notes (Addendum)
12:30pm: CSW received notification that patient was denied at [facility].  8:30am: CSW spoke with [place] at [facility] in [place] who is agreeable to review referral - clinicals sent via secure e-mail.  Madilyn Fireman, MSW, LCSW Transitions of Care  Clinical Social Worker II [phone]

## 2020-11-18 NOTE — TOC Progression Note (Addendum)
Transition of Care [facility]) - Progression Note    Patient Details  Name: Chase Caldwell MRN: [id number] Date of Birth: [birth year]  Transition of Care [facility]) CM/SW [place], RN Phone Number: 11/18/2020, 7:57 AM  Clinical Narrative:    Case management spoke with Kia, CM at [facility] in [place], [place] and she spoke with the area regional admission manager and the [facility] facilities are unable to offer an available bed for the patient due to his complex care issues and lack of LTC bed availability.  I sent a secure e-mail directly to the area regional manager with additional clinicals and contact information in case the facility had questions regarding the patient's care needs.  [place] is requesting information from the patient's wife regarding the patient's disability award letter.  The wife was contacted and she is sending the award letter and I will forward this to the [place] area admissions manager.  CM and MSW with DTP Team continue to explore options for LTC placement.  11/18/2020 1124 - I spoke with the Admission director at [facility] and they are unable to provide a facility due to the patient's complex care issues.  CM and MSW are waiting on return call from [place] facility to review the patient's clinicals at this time.  11/18/2020 1322 - CM called and left a message with numerous facilities for LTC bed availability:  [facility] [phone] - left message [facility]. [phone] - left message [place] - [phone] - declined - No LTC beds [facility] - [phone] - Left message   Expected Discharge Plan: Long Term Nursing Home Barriers to Discharge: Insurance Authorization, [place] ([place])  Expected Discharge Plan and Services Expected Discharge Plan: [place]     Post Acute Care Choice: Nursing  Home Living arrangements for the past 2 months: Single Family Home                                       Social Determinants of Health (SDOH) Interventions    Readmission Risk Interventions No flowsheet data found.
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
## 2020-11-19 DIAGNOSIS — R531 Weakness: Secondary | ICD-10-CM | POA: Diagnosis not present
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
## 2020-11-19 NOTE — TOC Progression Note (Addendum)
Transition of Care [facility]) - Progression Note    Patient Details  Name: Chase Caldwell MRN: [id number] Date of Birth: 1980/06/18  Transition of Care [facility]) CM/SW [place], RN Phone Number: 11/19/2020, 7:36 AM  Clinical Narrative:    Case management spoke with [facility] facility about needed LTC placement.  Clinicals were forwarded to the facility to review.  CM spoke with Jolene Schimke, Admission director with [facility] and she is sending the patient's clinicals for review to the area [facility] facilities.  I also asked that she forward the patient's clinicals as well to the other [facility] facilities in [facility] state since the patient has no pending bed offers at this time.  The patient is aware that CM and MSW will continue to explore admission options and that there are no bed offers at this time.  [facility] and [facility] are the current facilities review the patient for admission.  [facility] facilities have declined the patient for admission.  11/10-2020 1238 - CM spoke with Kenney Houseman, CM at [facility] facility in [place], [place] and clinicals were faxed to the facility at [phone] to review.   Expected Discharge Plan: Long Term Nursing Home Barriers to Discharge: Ship broker, [place] (PASRR)  Expected Discharge Plan and Services Expected Discharge Plan: [place]     Post Acute Care Choice: Nursing Home Living arrangements for the past 2 months: Single Family Home                                       Social Determinants of Health (SDOH) Interventions    Readmission Risk Interventions No flowsheet data found.

## 2020-11-19 NOTE — Progress Notes (Signed)
VAST consult for PIV placement. This RN spoke to [employer], RN to clarify need to PIV d/t no IV medications ordered at this time. Webb Silversmith, RN stated that Minette Brine, RN clarified the need for a PIV with MD Samatan and he wanted the PIV order placed for this pt.

## 2020-11-20 DIAGNOSIS — R531 Weakness: Secondary | ICD-10-CM | POA: Diagnosis not present

## 2020-11-20 NOTE — TOC Progression Note (Addendum)
Transition of Care [facility]) - Progression Note    Patient Details  Name: Chase Caldwell MRN: [id number] Date of Birth: Mar 11, 1980  Transition of Care [facility]) CM/SW [place], RN Phone Number: 11/20/2020, 9:51 AM  Clinical Narrative:    Case management called and left a message with Kenney Houseman, [place] at [facility] [phone] in [place], [place] to follow up on review of clinicals sent to the facility yesterday.  CM and MSW with DTP Team continue to follow the patient for LTC placement.  11/20/2020 1004 - CM resent clinicals to [facility] for review.  Kenney Houseman, CM at [place] facility was unable to offer the patient a bed at this time.  CM and MSW with DTP will continue to follow the patient for LTC placement.  11/20/2020  1142 - CM spoke with Darden Dates at [facility] skilled nursing facility and they have offered a bed for Monday, 11/23/2020.  Erin Hearing, NP was notified and is aware to coordinate discharge summary and orders.  I called and updated the patient's wife at this time so that if [facility] called for admission paperwork, then she could assist.  I will update the patient on Monday, 11/23/2020 just to be sure everything can be coordinated.   Expected Discharge Plan: Long Term Nursing Home Barriers to Discharge: Ship broker, [place] (PASRR)  Expected Discharge Plan and Services Expected Discharge Plan: [place]     Post Acute Care Choice: Nursing Home Living arrangements for the past 2 months: Single Family Home                                       Social Determinants of Health (SDOH) Interventions    Readmission Risk Interventions No flowsheet data found.
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
## 2020-11-20 NOTE — Progress Notes (Signed)
CSW scheduled transportation to [facility] for Monday 11/14 at 12pm via [employer].  Madilyn Fireman, MSW, LCSW Transitions of Care  Clinical Social Worker II [phone]

## 2020-11-21 DIAGNOSIS — R531 Weakness: Secondary | ICD-10-CM | POA: Diagnosis not present
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
## 2020-11-21 NOTE — Plan of Care (Signed)
  Problem: Education: Goal: Knowledge of disease or condition will improve Outcome: Progressing Goal: Knowledge of secondary prevention will improve Outcome: Progressing   Problem: Self-Care: Goal: Verbalization of feelings and concerns over difficulty with self-care will improve Outcome: Progressing   Problem: Nutrition: Goal: Risk of aspiration will decrease Outcome: Progressing   Problem: Ischemic Stroke/TIA Tissue Perfusion: Goal: Complications of ischemic stroke/TIA will be minimized Outcome: Progressing

## 2020-11-22 NOTE — Plan of Care (Signed)
  Problem: Education: Goal: Knowledge of disease or condition will improve Outcome: Progressing Goal: Knowledge of secondary prevention will improve Outcome: Progressing   Problem: Self-Care: Goal: Verbalization of feelings and concerns over difficulty with self-care will improve Outcome: Progressing   Problem: Nutrition: Goal: Risk of aspiration will decrease Outcome: Progressing   Problem: Ischemic Stroke/TIA Tissue Perfusion: Goal: Complications of ischemic stroke/TIA will be minimized Outcome: Progressing

## 2020-11-22 NOTE — Progress Notes (Signed)
Patient seen examined--no acute changes Sitting in bed, comfortable Likely to SNF when able and can be arranged  No charge  Verneita Griffes, MD [facility] 10:47 AM

## 2020-11-22 NOTE — Plan of Care (Signed)
  Problem: Health Behavior/Discharge Planning: Goal: Ability to manage health-related needs will improve Outcome: Progressing   Problem: Clinical Measurements: Goal: Ability to maintain clinical measurements within normal limits will improve Outcome: Progressing Goal: Will remain free from infection Outcome: Progressing Goal: Diagnostic test results will improve Outcome: Progressing Goal: Respiratory complications will improve Outcome: Progressing Goal: Cardiovascular complication will be avoided Outcome: Progressing   Problem: Activity: Goal: Risk for activity intolerance will decrease Outcome: Progressing   Problem: Nutrition: Goal: Adequate nutrition will be maintained Outcome: Progressing   Problem: Coping: Goal: Level of anxiety will decrease Outcome: Progressing   Problem: Elimination: Goal: Will not experience complications related to bowel motility Outcome: Progressing Goal: Will not experience complications related to urinary retention Outcome: Progressing   Problem: Pain Managment: Goal: General experience of comfort will improve Outcome: Progressing   Problem: Safety: Goal: Ability to remain free from injury will improve Outcome: Progressing   Problem: Skin Integrity: Goal: Risk for impaired skin integrity will decrease Outcome: Progressing   Problem: Education: Goal: Knowledge of disease or condition will improve Outcome: Progressing Goal: Knowledge of secondary prevention will improve Outcome: Progressing Goal: Knowledge of patient specific risk factors addressed and post discharge goals established will improve Outcome: Progressing   Problem: Self-Care: Goal: Verbalization of feelings and concerns over difficulty with self-care will improve Outcome: Progressing   Problem: Nutrition: Goal: Risk of aspiration will decrease Outcome: Progressing Goal: Dietary intake will improve Outcome: Progressing   Problem: Ischemic Stroke/TIA Tissue  Perfusion: Goal: Complications of ischemic stroke/TIA will be minimized Outcome: Progressing

## 2020-11-23 LAB — RESP PANEL BY RT-PCR (FLU A&B, COVID) ARPGX2
Influenza A by PCR: NEGATIVE
Influenza B by PCR: NEGATIVE
SARS Coronavirus 2 by RT PCR: NEGATIVE

## 2020-11-23 NOTE — Plan of Care (Signed)
  Problem: Education: Goal: Knowledge of disease or condition will improve Outcome: Progressing   Problem: Self-Care: Goal: Verbalization of feelings and concerns over difficulty with self-care will improve Outcome: Progressing   Problem: Ischemic Stroke/TIA Tissue Perfusion: Goal: Complications of ischemic stroke/TIA will be minimized Outcome: Progressing

## 2020-11-23 NOTE — Plan of Care (Signed)
  Problem: Health Behavior/Discharge Planning: Goal: Ability to manage health-related needs will improve Outcome: Adequate for Discharge   Problem: Clinical Measurements: Goal: Ability to maintain clinical measurements within normal limits will improve Outcome: Adequate for Discharge Goal: Will remain free from infection Outcome: Adequate for Discharge Goal: Diagnostic test results will improve Outcome: Adequate for Discharge Goal: Respiratory complications will improve Outcome: Adequate for Discharge Goal: Cardiovascular complication will be avoided Outcome: Adequate for Discharge   Problem: Activity: Goal: Risk for activity intolerance will decrease Outcome: Adequate for Discharge   Problem: Nutrition: Goal: Adequate nutrition will be maintained Outcome: Adequate for Discharge   Problem: Coping: Goal: Level of anxiety will decrease Outcome: Adequate for Discharge   Problem: Elimination: Goal: Will not experience complications related to bowel motility Outcome: Adequate for Discharge Goal: Will not experience complications related to urinary retention Outcome: Adequate for Discharge   Problem: Pain Managment: Goal: General experience of comfort will improve Outcome: Adequate for Discharge   Problem: Safety: Goal: Ability to remain free from injury will improve Outcome: Adequate for Discharge   Problem: Skin Integrity: Goal: Risk for impaired skin integrity will decrease Outcome: Adequate for Discharge   Problem: Education: Goal: Knowledge of disease or condition will improve Outcome: Adequate for Discharge Goal: Knowledge of secondary prevention will improve Outcome: Adequate for Discharge Goal: Knowledge of patient specific risk factors addressed and post discharge goals established will improve Outcome: Adequate for Discharge   Problem: Self-Care: Goal: Verbalization of feelings and concerns over difficulty with self-care will improve Outcome: Adequate for  Discharge   Problem: Nutrition: Goal: Risk of aspiration will decrease Outcome: Adequate for Discharge Goal: Dietary intake will improve Outcome: Adequate for Discharge   Problem: Ischemic Stroke/TIA Tissue Perfusion: Goal: Complications of ischemic stroke/TIA will be minimized Outcome: Adequate for Discharge

## 2020-11-23 NOTE — Progress Notes (Signed)
Report given to [facility]

## 2020-11-23 NOTE — TOC Progression Note (Signed)
Transition of Care [facility]) - Progression Note    Patient Details  Name: Chase Caldwell MRN: [id number] Date of Birth: 1980-12-18  Transition of Care [facility]) CM/SW [place], RN Phone Number: 11/23/2020, 8:38 AM  Clinical Narrative:    Case management spoke with Darden Dates, CM at [facility] and they are expecting patient to transfer to [facility] facility at 12 noon today by [employer] ambulance transport.  Madilyn Fireman, MSW arranged transportation for patient via [place] at 12 noon today and ambulance packet will be arranged and placed at the nursing station.  Erin Hearing, NP is aware and will be placing discharge orders and instructions.  Discharge summary and AVS to be placed in discharge packet for the facility.  CM called and spoke with the patient's wife on the phone and confirmed with her that the patient will be transferring to the facility at 12 noon today.  The patient's daughter, Loma Sousa will be picking the patient's electric wheelchair up and transferring it to the facility today.  Bedside nursing can call nursing report to [facility] at [phone].    Expected Discharge Plan: Long Term Nursing Home Barriers to Discharge: Ship broker, [place] (PASRR)  Expected Discharge Plan and Services Expected Discharge Plan: [place]     Post Acute Care Choice: Nursing Home Living arrangements for the past 2 months: Single Family Home                                       Social Determinants of Health (SDOH) Interventions    Readmission Risk Interventions No flowsheet data found.

## 2021-03-08 ENCOUNTER — Other Ambulatory Visit: Payer: Self-pay

## 2021-03-08 ENCOUNTER — Encounter (HOSPITAL_COMMUNITY): Payer: Self-pay

## 2021-03-08 ENCOUNTER — Emergency Department (HOSPITAL_COMMUNITY)
Admission: EM | Admit: 2021-03-08 | Discharge: 2021-03-09 | Disposition: A | Discharge status: Skilled Nursing Facility | Payer: Medicaid Other | Attending: Emergency Medicine | Admitting: Emergency Medicine

## 2021-03-08 ENCOUNTER — Emergency Department (HOSPITAL_COMMUNITY): Payer: Medicaid Other

## 2021-03-08 DIAGNOSIS — R0789 Other chest pain: Secondary | ICD-10-CM

## 2021-03-08 DIAGNOSIS — Z7982 Long term (current) use of aspirin: Secondary | ICD-10-CM | POA: Insufficient documentation

## 2021-03-08 DIAGNOSIS — Z20822 Contact with and (suspected) exposure to covid-19: Secondary | ICD-10-CM | POA: Diagnosis not present

## 2021-03-08 LAB — BASIC METABOLIC PANEL
Anion gap: 11 (ref 5–15)
BUN: <5 mg/dL — ABNORMAL LOW (ref 6–20)
CO2: 25 mmol/L (ref 22–32)
Calcium: 9.2 mg/dL (ref 8.9–10.3)
Chloride: 103 mmol/L (ref 98–111)
Creatinine, Ser: 0.7 mg/dL (ref 0.61–1.24)
GFR, Estimated: >60 mL/min (ref >60)
Glucose, Bld: 103 mg/dL — ABNORMAL HIGH (ref 70–99)
Potassium: 3.9 mmol/L (ref 3.5–5.1)
Sodium: 139 mmol/L (ref 135–145)

## 2021-03-08 LAB — CBC
HCT: 41.7 % (ref 39.0–52.0)
Hemoglobin: 13 g/dL (ref 13.0–17.0)
MCH: 28.9 pg (ref 26.0–34.0)
MCHC: 31.2 g/dL (ref 30.0–36.0)
MCV: 92.7 fL (ref 80.0–100.0)
Platelets: 346 10*3/uL (ref 150–400)
RBC: 4.5 MIL/uL (ref 4.22–5.81)
RDW: 13.2 % (ref 11.5–15.5)
WBC: 13.1 10*3/uL — ABNORMAL HIGH (ref 4.0–10.5)
nRBC: 0 % (ref 0.0–0.2)

## 2021-03-08 LAB — RESP PANEL BY RT-PCR (FLU A&B, COVID) ARPGX2
Influenza A by PCR: NEGATIVE
Influenza B by PCR: NEGATIVE
SARS Coronavirus 2 by RT PCR: NEGATIVE

## 2021-03-08 LAB — TROPONIN I (HIGH SENSITIVITY): Troponin I (High Sensitivity): <2 ng/L (ref <18)

## 2021-03-08 MED ORDER — ASPIRIN 81 MG PO CHEW
81.0000 mg | CHEWABLE_TABLET | Freq: Once | ORAL | Status: AC
Start: 2021-03-08 — End: 2021-03-08
  Administered 2021-03-08: 81 mg via ORAL
  Filled 2021-03-08: qty 1

## 2021-03-08 MED ORDER — FENTANYL CITRATE PF 50 MCG/ML IJ SOSY
50.0000 ug | PREFILLED_SYRINGE | Freq: Once | INTRAMUSCULAR | Status: AC
  Administered 2021-03-08: 50 ug via INTRAVENOUS
  Filled 2021-03-08: qty 1

## 2021-03-08 MED ORDER — ONDANSETRON HCL 4 MG/2ML IJ SOLN
4.0000 mg | Freq: Once | INTRAMUSCULAR | Status: AC
Start: 2021-03-08 — End: 2021-03-08
  Administered 2021-03-08: 4 mg via INTRAVENOUS
  Filled 2021-03-08: qty 2
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
## 2021-03-08 NOTE — Discharge Instructions (Signed)

## 2021-03-09 LAB — TROPONIN I (HIGH SENSITIVITY): Troponin I (High Sensitivity): <2 ng/L (ref <18)

## 2021-03-09 MED ORDER — PANTOPRAZOLE SODIUM 40 MG PO TBEC
40.0000 mg | DELAYED_RELEASE_TABLET | Freq: Every day | ORAL | Status: DC
  Administered 2021-03-09: 40 mg via ORAL
  Filled 2021-03-09: qty 1
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
## 2021-03-09 NOTE — ED Notes (Signed)
Called [facility] to transport patient to [facility]

## 2021-03-17 ENCOUNTER — Other Ambulatory Visit: Payer: Self-pay

## 2021-03-17 ENCOUNTER — Inpatient Hospital Stay (HOSPITAL_COMMUNITY)
Admission: EM | Admit: 2021-03-17 | Discharge: 2021-03-26 | DRG: 091 | Disposition: A | Discharge status: Skilled Nursing Facility | Payer: Medicaid Other | Source: Skilled Nursing Facility | Attending: Family Medicine | Admitting: Family Medicine

## 2021-03-17 ENCOUNTER — Encounter (HOSPITAL_COMMUNITY): Payer: Self-pay | Admitting: Family Medicine

## 2021-03-17 ENCOUNTER — Emergency Department (HOSPITAL_COMMUNITY): Payer: Medicaid Other

## 2021-03-17 ENCOUNTER — Inpatient Hospital Stay (HOSPITAL_COMMUNITY): Payer: Medicaid Other

## 2021-03-17 DIAGNOSIS — F4310 Post-traumatic stress disorder, unspecified: Secondary | ICD-10-CM | POA: Diagnosis present

## 2021-03-17 DIAGNOSIS — S069X3S Unspecified intracranial injury with loss of consciousness of 1 hour to 5 hours 59 minutes, sequela: Secondary | ICD-10-CM

## 2021-03-17 DIAGNOSIS — G253 Myoclonus: Secondary | ICD-10-CM | POA: Diagnosis present

## 2021-03-17 DIAGNOSIS — Z20822 Contact with and (suspected) exposure to covid-19: Secondary | ICD-10-CM | POA: Diagnosis present

## 2021-03-17 DIAGNOSIS — Y92129 Unspecified place in nursing home as the place of occurrence of the external cause: Secondary | ICD-10-CM | POA: Diagnosis not present

## 2021-03-17 DIAGNOSIS — G931 Anoxic brain damage, not elsewhere classified: Secondary | ICD-10-CM | POA: Diagnosis present

## 2021-03-17 DIAGNOSIS — G928 Other toxic encephalopathy: Secondary | ICD-10-CM | POA: Diagnosis not present

## 2021-03-17 DIAGNOSIS — D649 Anemia, unspecified: Secondary | ICD-10-CM | POA: Diagnosis present

## 2021-03-17 DIAGNOSIS — F319 Bipolar disorder, unspecified: Secondary | ICD-10-CM | POA: Diagnosis not present

## 2021-03-17 DIAGNOSIS — D75839 Thrombocytosis, unspecified: Secondary | ICD-10-CM | POA: Diagnosis present

## 2021-03-17 DIAGNOSIS — N170 Acute kidney failure with tubular necrosis: Secondary | ICD-10-CM | POA: Diagnosis present

## 2021-03-17 DIAGNOSIS — S069X0S Unspecified intracranial injury without loss of consciousness, sequela: Secondary | ICD-10-CM | POA: Diagnosis not present

## 2021-03-17 DIAGNOSIS — T43595A Adverse effect of other antipsychotics and neuroleptics, initial encounter: Secondary | ICD-10-CM | POA: Diagnosis present

## 2021-03-17 DIAGNOSIS — Z87820 Personal history of traumatic brain injury: Secondary | ICD-10-CM

## 2021-03-17 DIAGNOSIS — Z888 Allergy status to other drugs, medicaments and biological substances status: Secondary | ICD-10-CM

## 2021-03-17 DIAGNOSIS — E162 Hypoglycemia, unspecified: Secondary | ICD-10-CM | POA: Diagnosis present

## 2021-03-17 DIAGNOSIS — M6282 Rhabdomyolysis: Secondary | ICD-10-CM | POA: Diagnosis present

## 2021-03-17 DIAGNOSIS — Z66 Do not resuscitate: Secondary | ICD-10-CM | POA: Diagnosis present

## 2021-03-17 DIAGNOSIS — E860 Dehydration: Secondary | ICD-10-CM | POA: Diagnosis present

## 2021-03-17 DIAGNOSIS — E8729 Other acidosis: Secondary | ICD-10-CM | POA: Diagnosis present

## 2021-03-17 DIAGNOSIS — E870 Hyperosmolality and hypernatremia: Secondary | ICD-10-CM | POA: Diagnosis present

## 2021-03-17 DIAGNOSIS — K8020 Calculus of gallbladder without cholecystitis without obstruction: Secondary | ICD-10-CM | POA: Diagnosis present

## 2021-03-17 DIAGNOSIS — G40901 Epilepsy, unspecified, not intractable, with status epilepticus: Secondary | ICD-10-CM | POA: Diagnosis present

## 2021-03-17 DIAGNOSIS — I9589 Other hypotension: Secondary | ICD-10-CM | POA: Diagnosis present

## 2021-03-17 DIAGNOSIS — R9431 Abnormal electrocardiogram [ECG] [EKG]: Secondary | ICD-10-CM | POA: Diagnosis present

## 2021-03-17 DIAGNOSIS — G9341 Metabolic encephalopathy: Secondary | ICD-10-CM | POA: Diagnosis not present

## 2021-03-17 DIAGNOSIS — F3130 Bipolar disorder, current episode depressed, mild or moderate severity, unspecified: Secondary | ICD-10-CM | POA: Diagnosis present

## 2021-03-17 DIAGNOSIS — R6520 Severe sepsis without septic shock: Secondary | ICD-10-CM

## 2021-03-17 DIAGNOSIS — S069XAA Unspecified intracranial injury with loss of consciousness status unknown, initial encounter: Secondary | ICD-10-CM | POA: Diagnosis present

## 2021-03-17 DIAGNOSIS — N179 Acute kidney failure, unspecified: Secondary | ICD-10-CM

## 2021-03-17 DIAGNOSIS — A419 Sepsis, unspecified organism: Secondary | ICD-10-CM

## 2021-03-17 DIAGNOSIS — E872 Acidosis, unspecified: Secondary | ICD-10-CM | POA: Diagnosis present

## 2021-03-17 DIAGNOSIS — G8250 Quadriplegia, unspecified: Secondary | ICD-10-CM | POA: Diagnosis present

## 2021-03-17 DIAGNOSIS — F17210 Nicotine dependence, cigarettes, uncomplicated: Secondary | ICD-10-CM | POA: Diagnosis present

## 2021-03-17 DIAGNOSIS — F909 Attention-deficit hyperactivity disorder, unspecified type: Secondary | ICD-10-CM | POA: Diagnosis present

## 2021-03-17 DIAGNOSIS — I471 Supraventricular tachycardia: Secondary | ICD-10-CM | POA: Diagnosis not present

## 2021-03-17 DIAGNOSIS — K7200 Acute and subacute hepatic failure without coma: Secondary | ICD-10-CM | POA: Diagnosis present

## 2021-03-17 DIAGNOSIS — I959 Hypotension, unspecified: Secondary | ICD-10-CM | POA: Diagnosis not present

## 2021-03-17 DIAGNOSIS — R410 Disorientation, unspecified: Principal | ICD-10-CM

## 2021-03-17 DIAGNOSIS — I10 Essential (primary) hypertension: Secondary | ICD-10-CM | POA: Diagnosis present

## 2021-03-17 DIAGNOSIS — E876 Hypokalemia: Secondary | ICD-10-CM | POA: Diagnosis present

## 2021-03-17 DIAGNOSIS — T56891A Toxic effect of other metals, accidental (unintentional), initial encounter: Secondary | ICD-10-CM

## 2021-03-17 DIAGNOSIS — Z7982 Long term (current) use of aspirin: Secondary | ICD-10-CM

## 2021-03-17 DIAGNOSIS — E861 Hypovolemia: Secondary | ICD-10-CM | POA: Diagnosis present

## 2021-03-17 DIAGNOSIS — Z8674 Personal history of sudden cardiac arrest: Secondary | ICD-10-CM

## 2021-03-17 DIAGNOSIS — Z818 Family history of other mental and behavioral disorders: Secondary | ICD-10-CM

## 2021-03-17 DIAGNOSIS — Z9151 Personal history of suicidal behavior: Secondary | ICD-10-CM

## 2021-03-17 DIAGNOSIS — F419 Anxiety disorder, unspecified: Secondary | ICD-10-CM | POA: Diagnosis present

## 2021-03-17 DIAGNOSIS — Z79899 Other long term (current) drug therapy: Secondary | ICD-10-CM

## 2021-03-17 LAB — CBC WITH DIFFERENTIAL/PLATELET
Abs Immature Granulocytes: 0.4 10*3/uL — ABNORMAL HIGH (ref 0.00–0.07)
Basophils Absolute: 0.1 10*3/uL (ref 0.0–0.1)
Basophils Relative: 0 %
Eosinophils Absolute: 0 10*3/uL (ref 0.0–0.5)
Eosinophils Relative: 0 %
HCT: 44.5 % (ref 39.0–52.0)
Hemoglobin: 14.8 g/dL (ref 13.0–17.0)
Immature Granulocytes: 2 %
Lymphocytes Relative: 6 %
Lymphs Abs: 1.6 10*3/uL (ref 0.7–4.0)
MCH: 28.9 pg (ref 26.0–34.0)
MCHC: 33.3 g/dL (ref 30.0–36.0)
MCV: 86.9 fL (ref 80.0–100.0)
Monocytes Absolute: 0.9 10*3/uL (ref 0.1–1.0)
Monocytes Relative: 4 %
Neutro Abs: 23.2 10*3/uL — ABNORMAL HIGH (ref 1.7–7.7)
Neutrophils Relative %: 88 %
Platelets: 772 10*3/uL — ABNORMAL HIGH (ref 150–400)
RBC: 5.12 MIL/uL (ref 4.22–5.81)
RDW: 13.4 % (ref 11.5–15.5)
WBC: 26.2 10*3/uL — ABNORMAL HIGH (ref 4.0–10.5)
nRBC: 0 % (ref 0.0–0.2)

## 2021-03-17 LAB — URINALYSIS, ROUTINE W REFLEX MICROSCOPIC
Glucose, UA: 100 mg/dL — AB
Ketones, ur: NEGATIVE mg/dL
Nitrite: NEGATIVE
Protein, ur: 100 mg/dL — AB
Specific Gravity, Urine: 1.02 (ref 1.005–1.030)
pH: 6 (ref 5.0–8.0)

## 2021-03-17 LAB — MRSA NEXT GEN BY PCR, NASAL: MRSA by PCR Next Gen: DETECTED — AB

## 2021-03-17 LAB — URINALYSIS, MICROSCOPIC (REFLEX)

## 2021-03-17 LAB — COMPREHENSIVE METABOLIC PANEL
ALT: 106 U/L — ABNORMAL HIGH (ref 0–44)
ALT: 98 U/L — ABNORMAL HIGH (ref 0–44)
AST: 182 U/L — ABNORMAL HIGH (ref 15–41)
AST: 168 U/L — ABNORMAL HIGH (ref 15–41)
Albumin: 2.8 g/dL — ABNORMAL LOW (ref 3.5–5.0)
Albumin: 2.2 g/dL — ABNORMAL LOW (ref 3.5–5.0)
Alkaline Phosphatase: 142 U/L — ABNORMAL HIGH (ref 38–126)
Alkaline Phosphatase: 109 U/L (ref 38–126)
Anion gap: 18 — ABNORMAL HIGH (ref 5–15)
Anion gap: 17 — ABNORMAL HIGH (ref 5–15)
BUN: 43 mg/dL — ABNORMAL HIGH (ref 6–20)
BUN: 45 mg/dL — ABNORMAL HIGH (ref 6–20)
CO2: 20 mmol/L — ABNORMAL LOW (ref 22–32)
CO2: 17 mmol/L — ABNORMAL LOW (ref 22–32)
Calcium: 9.4 mg/dL (ref 8.9–10.3)
Calcium: 8.2 mg/dL — ABNORMAL LOW (ref 8.9–10.3)
Chloride: 101 mmol/L (ref 98–111)
Chloride: 108 mmol/L (ref 98–111)
Creatinine, Ser: 4.53 mg/dL — ABNORMAL HIGH (ref 0.61–1.24)
Creatinine, Ser: 4.28 mg/dL — ABNORMAL HIGH (ref 0.61–1.24)
GFR, Estimated: 16 mL/min — ABNORMAL LOW (ref >60)
GFR, Estimated: 17 mL/min — ABNORMAL LOW (ref >60)
Glucose, Bld: 125 mg/dL — ABNORMAL HIGH (ref 70–99)
Glucose, Bld: 97 mg/dL (ref 70–99)
Potassium: 4 mmol/L (ref 3.5–5.1)
Potassium: 4.2 mmol/L (ref 3.5–5.1)
Sodium: 139 mmol/L (ref 135–145)
Sodium: 142 mmol/L (ref 135–145)
Total Bilirubin: 0.6 mg/dL (ref 0.3–1.2)
Total Bilirubin: 0.9 mg/dL (ref 0.3–1.2)
Total Protein: 8 g/dL (ref 6.5–8.1)
Total Protein: 6.1 g/dL — ABNORMAL LOW (ref 6.5–8.1)

## 2021-03-17 LAB — LITHIUM LEVEL
Lithium Lvl: 0.96 mmol/L (ref 0.60–1.20)
Lithium Lvl: 0.82 mmol/L (ref 0.60–1.20)

## 2021-03-17 LAB — CBG MONITORING, ED: Glucose-Capillary: 123 mg/dL — ABNORMAL HIGH (ref 70–99)

## 2021-03-17 LAB — TSH: TSH: 1.278 u[IU]/mL (ref 0.350–4.500)

## 2021-03-17 LAB — LACTIC ACID, PLASMA
Lactic Acid, Venous: 1.5 mmol/L (ref 0.5–1.9)
Lactic Acid, Venous: 3 mmol/L (ref 0.5–1.9)
Lactic Acid, Venous: 0.9 mmol/L (ref 0.5–1.9)
Lactic Acid, Venous: 0.9 mmol/L (ref 0.5–1.9)

## 2021-03-17 LAB — POCT I-STAT 7, (LYTES, BLD GAS, ICA,H+H)
Acid-base deficit: 3 mmol/L — ABNORMAL HIGH (ref 0.0–2.0)
Bicarbonate: 23.8 mmol/L (ref 20.0–28.0)
Calcium, Ion: 1.24 mmol/L (ref 1.15–1.40)
HCT: 31 % — ABNORMAL LOW (ref 39.0–52.0)
Hemoglobin: 10.5 g/dL — ABNORMAL LOW (ref 13.0–17.0)
O2 Saturation: 99 %
Potassium: 3.4 mmol/L — ABNORMAL LOW (ref 3.5–5.1)
Sodium: 142 mmol/L (ref 135–145)
TCO2: 25 mmol/L (ref 22–32)
pCO2 arterial: 47.6 mmHg (ref 32–48)
pH, Arterial: 7.308 — ABNORMAL LOW (ref 7.35–7.45)
pO2, Arterial: 131 mmHg — ABNORMAL HIGH (ref 83–108)

## 2021-03-17 LAB — GLUCOSE, CAPILLARY
Glucose-Capillary: 91 mg/dL (ref 70–99)
Glucose-Capillary: 89 mg/dL (ref 70–99)
Glucose-Capillary: 84 mg/dL (ref 70–99)

## 2021-03-17 LAB — PROTIME-INR
INR: 1.3 — ABNORMAL HIGH (ref 0.8–1.2)
Prothrombin Time: 15.8 seconds — ABNORMAL HIGH (ref 11.4–15.2)

## 2021-03-17 LAB — RESP PANEL BY RT-PCR (FLU A&B, COVID) ARPGX2
Influenza A by PCR: NEGATIVE
Influenza B by PCR: NEGATIVE
SARS Coronavirus 2 by RT PCR: NEGATIVE

## 2021-03-17 LAB — AMMONIA: Ammonia: 20 umol/L (ref 9–35)

## 2021-03-17 LAB — CK: Total CK: 2194 U/L — ABNORMAL HIGH (ref 49–397)

## 2021-03-17 LAB — PROCALCITONIN: Procalcitonin: 0.75 ng/mL

## 2021-03-17 LAB — APTT: aPTT: 30 seconds (ref 24–36)

## 2021-03-17 MED ORDER — LEVETIRACETAM IN NACL 1000 MG/100ML IV SOLN
1000.0000 mg | Freq: Once | INTRAVENOUS | Status: AC
  Administered 2021-03-17: 1000 mg via INTRAVENOUS
  Filled 2021-03-17: qty 100

## 2021-03-17 MED ORDER — ACETAMINOPHEN 650 MG RE SUPP: 650.0000 mg | Freq: Four times a day (QID) | RECTAL | Status: DC | PRN

## 2021-03-17 MED ORDER — SODIUM CHLORIDE 0.9 % IV BOLUS (SEPSIS)
1000.0000 mL | Freq: Once | INTRAVENOUS | Status: AC
  Administered 2021-03-17: 1000 mL via INTRAVENOUS

## 2021-03-17 MED ORDER — LINEZOLID 600 MG PO TABS: 600.0000 mg | ORAL_TABLET | Freq: Two times a day (BID) | ORAL | Status: DC

## 2021-03-17 MED ORDER — VANCOMYCIN HCL 2000 MG/400ML IV SOLN
2000.0000 mg | Freq: Once | INTRAVENOUS | Status: AC
  Administered 2021-03-17: 2000 mg via INTRAVENOUS
  Filled 2021-03-17: qty 400

## 2021-03-17 MED ORDER — LORAZEPAM 2 MG/ML IJ SOLN
2.0000 mg | Freq: Once | INTRAMUSCULAR | Status: AC
  Administered 2021-03-17: 2 mg via INTRAVENOUS

## 2021-03-17 MED ORDER — SODIUM CHLORIDE 0.9 % IV SOLN: INTRAVENOUS | Status: DC

## 2021-03-17 MED ORDER — SODIUM CHLORIDE 0.9 % IV SOLN
1.0000 g | Freq: Two times a day (BID) | INTRAVENOUS | Status: DC
  Administered 2021-03-17 – 2021-03-19 (×4): 1 g via INTRAVENOUS
  Filled 2021-03-17 (×5): qty 20

## 2021-03-17 MED ORDER — SODIUM CHLORIDE 0.9 % IV BOLUS
2000.0000 mL | Freq: Once | INTRAVENOUS | Status: AC
Start: 2021-03-17 — End: 2021-03-17
  Administered 2021-03-17: 2000 mL via INTRAVENOUS

## 2021-03-17 MED ORDER — CALCIUM GLUCONATE-NACL 1-0.675 GM/50ML-% IV SOLN
1.0000 g | Freq: Once | INTRAVENOUS | Status: DC
  Filled 2021-03-17: qty 50

## 2021-03-17 MED ORDER — HEPARIN SODIUM (PORCINE) 5000 UNIT/ML IJ SOLN
5000.0000 [IU] | Freq: Three times a day (TID) | INTRAMUSCULAR | Status: DC
  Administered 2021-03-17 – 2021-03-26 (×28): 5000 [IU] via SUBCUTANEOUS
  Filled 2021-03-17 (×28): qty 1

## 2021-03-17 MED ORDER — SODIUM CHLORIDE 0.9 % IV SOLN
2.0000 g | Freq: Once | INTRAVENOUS | Status: AC
  Administered 2021-03-17: 2 g via INTRAVENOUS
  Filled 2021-03-17: qty 2

## 2021-03-17 MED ORDER — SODIUM CHLORIDE 0.9 % IV SOLN: 2.0000 g | INTRAVENOUS | Status: DC

## 2021-03-17 MED ORDER — VANCOMYCIN VARIABLE DOSE PER UNSTABLE RENAL FUNCTION (PHARMACIST DOSING): Status: DC

## 2021-03-17 MED ORDER — SODIUM CHLORIDE 0.9 % IV BOLUS
1000.0000 mL | Freq: Once | INTRAVENOUS | Status: AC
  Administered 2021-03-17: 1000 mL via INTRAVENOUS

## 2021-03-17 MED ORDER — CHLORHEXIDINE GLUCONATE CLOTH 2 % EX PADS
6.0000 | MEDICATED_PAD | Freq: Every day | CUTANEOUS | Status: DC
  Administered 2021-03-17 – 2021-03-26 (×9): 6 via TOPICAL

## 2021-03-17 MED ORDER — SODIUM CHLORIDE 0.9 % IV SOLN: INTRAVENOUS | Status: DC | PRN

## 2021-03-17 MED ORDER — LORAZEPAM 2 MG/ML IJ SOLN: 0.5000 mg | Freq: Once | INTRAMUSCULAR | Status: DC

## 2021-03-17 MED ORDER — LACTATED RINGERS IV BOLUS
1000.0000 mL | Freq: Once | INTRAVENOUS | Status: AC
  Administered 2021-03-17: 1000 mL via INTRAVENOUS

## 2021-03-17 MED ORDER — LORAZEPAM 2 MG/ML IJ SOLN
1.0000 mg | INTRAMUSCULAR | Status: DC | PRN
  Administered 2021-03-17 – 2021-03-18 (×2): 2 mg via INTRAVENOUS
  Filled 2021-03-17 (×3): qty 1

## 2021-03-17 MED ORDER — SODIUM CHLORIDE 0.9% FLUSH
3.0000 mL | Freq: Two times a day (BID) | INTRAVENOUS | Status: DC
  Administered 2021-03-17 – 2021-03-25 (×17): 3 mL via INTRAVENOUS

## 2021-03-17 MED ORDER — LACTATED RINGERS IV BOLUS (SEPSIS): 1000.0000 mL | Freq: Once | INTRAVENOUS | Status: DC

## 2021-03-17 MED ORDER — LACTATED RINGERS IV BOLUS
1000.0000 mL | Freq: Once | INTRAVENOUS | Status: AC
Start: 2021-03-17 — End: 2021-03-17
  Administered 2021-03-17: 1000 mL via INTRAVENOUS

## 2021-03-17 MED ORDER — SODIUM BICARBONATE 8.4 % IV SOLN
INTRAVENOUS | Status: DC
  Filled 2021-03-17 (×3): qty 1000

## 2021-03-17 MED ORDER — LINEZOLID 600 MG/300ML IV SOLN
600.0000 mg | Freq: Two times a day (BID) | INTRAVENOUS | Status: DC
Start: 2021-03-19 — End: 2021-03-19
  Filled 2021-03-17: qty 300

## 2021-03-17 MED ORDER — ALBUTEROL SULFATE (2.5 MG/3ML) 0.083% IN NEBU: 2.5000 mg | INHALATION_SOLUTION | Freq: Four times a day (QID) | RESPIRATORY_TRACT | Status: DC | PRN

## 2021-03-17 MED ORDER — ACETAMINOPHEN 325 MG PO TABS: 650.0000 mg | ORAL_TABLET | Freq: Four times a day (QID) | ORAL | Status: DC | PRN

## 2021-03-17 NOTE — ED Notes (Signed)
Pt changed, peri-care, linens changed, position changed, pt unable to follow commands but is more reactive then beginning of shift. Pt  reacts to his name being called. Pt is restless.  ?

## 2021-03-17 NOTE — Assessment & Plan Note (Addendum)
Patient is normally able to carry on a conversation, but was noted to be acutely altered currently only saying 1 word at this time.  CT scan of the head did not note any acute abnormality.  Patient is on lithium for history of bipolar disorder, but lithium levels within normal limits on 2 separate checks.  Question is symptoms secondary to lithium vs. neuro malignant syndrome vs.  seizure vs. possibility of CVA ?-Neurochecks ?-Check TSH ?-Check EEG ?-Check MRI brain ?-Speech therapy consulted to evaluate for safety swallowing ?-Neurology consulted,  will follow-up for further recommendations ?-Holding lithium, Seroquel, and cephalosporins per neuro recommendation ?

## 2021-03-17 NOTE — Progress Notes (Signed)
EEG complete - results pending 

## 2021-03-17 NOTE — ED Notes (Signed)
Lactic acid 3.0 

## 2021-03-17 NOTE — Progress Notes (Addendum)
Pharmacy Antibiotic Note ? ?Chase Caldwell is a 41 y.o. male admitted on 03/17/2021 with sepsis. Pharmacy has been consulted for vancomycin and cefepime dosing. ? ?WBC 26.2, Cr 4.53, Temp (24hrs), Avg:98.6 ?F (37 ?C), Min:98.6 ?F (37 ?C), Max:98.6 ?F (37 ?C) ? ?Patient appears to be in acute renal failure. Baseline scr around 0.8 but up to 4.5. Will load with vancomycin today and check levels as needed based on renal function.  ? ?Plan: ?Vancomycin 2 g IV loading dose ?Redose based on random levels ?Cefepime 2 g IV every 24 hours ? ?Height: '6\' 1"'$  (185.4 cm) ?Weight: 95.3 kg (210 lb 1.6 oz) ?IBW/kg (Calculated) : 79.9 ? ?Recent Labs  ?Lab 03/17/21 ?0126  ?WBC 26.2*  ?CREATININE 4.53*  ?LATICACIDVEN 1.5  ?  ?Estimated Creatinine Clearance: 23.8 mL/min (A) (by C-G formula based on SCr of 4.53 mg/dL (H)).   ? ?Allergies  ?Allergen Reactions  ? Tizanidine Hcl   ?  Emotional lability  ? Tuberculin Tests   ? ? ?Antimicrobials this admission: ?Vancomycin 3/8 >>  ?Cefepime 3/8 >>  ? ?Thank you for allowing pharmacy to be a part of this patient?s care. ? ?Carma Lair, PharmD Candidate [phone] ?03/17/2021 2:21 AM ? ?
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
## 2021-03-17 NOTE — Procedures (Addendum)
Patient Name: Chase Caldwell  ?MRN: [id number]  ?Epilepsy Attending: Lora Havens  ?Referring Physician/Provider: Norval Morton, MD ?Date: 03/17/2021 ?Duration: 56.24 mins ? ?Patient history: 41 year old man with above past medical history presenting from his facility for altered mental status of at least 24 hours duration. EEG to evaluate for seizure ? ?Level of alertness: lethargic  ? ?AEDs during EEG study: None ? ?Technical aspects: This EEG study was done with scalp electrodes positioned according to the 10-20 International system of electrode placement. Electrical activity was acquired at a sampling rate of '500Hz'$  and reviewed with a high frequency filter of '70Hz'$  and a low frequency filter of '1Hz'$ . EEG data were recorded continuously and digitally stored.  ? ?Description: EEG showed continuous generalized 3 to 6 Hz theta-delta slowing. Generalized periodic discharges with triphasic morphology at  1 Hz were also noted. Hyperventilation and photic stimulation were not performed.    ? ?ABNORMALITY ?- Periodic discharges with triphasic morphology, generalized ( GPDs) ?- Continuous slow, generalized ? ?IMPRESSION: ?This study is suggestive of moderate diffuse encephalopathy, nonspecific etiology but likely related to toxic-metabolic causes. No seizures or definite epileptiform discharges were seen throughout the recording. ? ?Lora Havens  ? ?

## 2021-03-17 NOTE — Assessment & Plan Note (Signed)
Prior to arrival.   ?
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
## 2021-03-17 NOTE — Progress Notes (Addendum)
eLink Physician-Brief Progress Note ?Patient Name: Chase Caldwell ?DOB: Jul 14, 1980 ?MRN: [id number] ? ? ?Date of Service ? 03/17/2021  ?HPI/Events of Note ? Seizure? - Now transferred to ICU. Patient confused. Postictal vs benzodiazepine for treatment of seizure? Nursing told to inform PCCM when he arrived in ICU. BP = 106/89.  Sat- 98% and RR = 21. Blood glucose = 89. STAT EEG revealsed generalized periodic discharge at 1 Hz. Neurology did not feel the need for antiepileptics at this time. Plan was for continued LTM.  ?eICU Interventions ? Plan: ?PCCM ground team notified of patient's arrival in ICU. ?Bedside nurse asked to call Neurology who is already follwing the patient for further management.  ? ? ? ?Intervention Category ?Major Interventions: Delirium, psychosis, severe agitation - evaluation and management;Seizures - evaluation and management ? ?Valyn Latchford Cornelia Copa ?03/17/2021, 7:57 PM ?

## 2021-03-17 NOTE — ED Triage Notes (Signed)
Pt brought in by EMS for altered mental status that started earlier today. Pt has a history of TBI. Pt alert to self at this time. Staff reports pt received a Covid booster this evening at 1800. ?

## 2021-03-17 NOTE — Progress Notes (Deleted)
[facility]  EEG REPORT    NAME: Chase Caldwell   MRN: [id number]  DOB: 08/14/1966  GENDER: male AGE: 54yr     SERVICE DATE: 06/10/21  STUDY DURATION: 33 minutes  STUDY NUMBER: [id number]  ORDERING PHYSICIAN: Rasmussen, Ben Gregory, DO  EEG TECHNOLOGIST: R. Bastidas  R. EEG T.  LOCATION: [facility]  EEG SYSTEM: [employer] [facility]     CLINICAL HISTORY:  41yr old male who is having this EEG done for abnormal spell.    Other Data   MEDICATIONS:  Current Outpatient Medications   Medication Sig Dispense Refill    Albuterol (PROAIR HFA, PROVENTIL HFA, VENTOLIN HFA) 90 mcg/actuation inhaler Take 1-2 puffs by inhalation every 4 hours if needed for wheezing. 17 g 1    Allopurinol (ZYLOPRIM) 300 mg Tablet TAKE ONE TABLET BY MOUTH EVERY DAY 90 tablet 1    Aspirin 81 mg Chewable Tablet Take 1 tablet by mouth every day. 30 tablet 0    Atorvastatin (LIPITOR) 40 mg tablet TAKE ONE TABLET BY MOUTH AT BEDTIME 90 tablet 1    Azelaic Acid (FINACEA) 15 % Gel Apply to forehead, cheeks, nose, and chin twice daily 50 g 3    Azelastine Nasal (ASTELIN) 137 mcg (0.1 %) Spray Instill 2 sprays into EACH nostril 2 times daily. 30 mL 6    Chlorthalidone (THALITONE) 25 mg Tablet TAKE ONE TABLET BY MOUTH EVERY DAY 30 tablet 5    Diclofenac (VOLTAREN) 1 % Gel Apply 2 g to the affected area three times daily if needed. 100 g 1    FLOVENT HFA 110 mcg/actuation Inhaler Take 1 puff by inhalation every day. 12 g 1    Fluticasone (FLONASE) 50 mcg/actuation nasal spray Instill 1 spray into EACH nostril every day. Use after performing nasal saline irrigations 16 g 11    Gabapentin (NEURONTIN) 300 mg Capsule TAKE ONE CAPSULE BY MOUTH EVERY MORNING AND TAKE TWO CAPSULES BY MOUTH EVERY EVENING 90 capsule 5    Losartan (COZAAR) 100 mg tablet TAKE ONE TABLET BY MOUTH EVERY DAY 30 tablet 5    Pantoprazole (PROTONIX) 40 mg Delayed Release Tablet TAKE ONE TABLET BY MOUTH EVERY MORNING BEFORE A MEAL 90 tablet 0    Prazosin (MINIPRESS) 5 mg Capsule Take 1 capsule by mouth  every day at bedtime. 90 capsule 1    Trazodone (DESYREL) 50 mg Tablet TAKE ONE TABLET BY MOUTH AT BEDTIME AS NEEDED 30 tablet 5     No current facility-administered medications for this visit.       TECHNICAL DESCRIPTION:  This digital video EEG was performed using the standard 10-20 system of electrode placement and one channel of EKG.           EEG DESCRIPTION:    Clinical State: Awake  Background:  9-10 Hz; posterior head regions; symmetric, waxing and waning, reactive to eye opening and closure  Beta: 18-22 Hz; frontocentral, symmetric, waxing and waning        No stage II/N2 sleep was recorded    ACTIVATION:  Hyperventilation was performed for 3-minutes, producing no abnormal discharges  Photic stimulation was performed at frequencies ranging from 1-60 Hz, producing no abnormal discharges           IMPRESSION: Normal        CLINICAL CORRELATION:  This EEG in the awake state is normal. There are no focal, lateralized or epileptiform abnormalities.   No episodes of concern were captured.      Palak   Parikh, MD

## 2021-03-17 NOTE — Assessment & Plan Note (Addendum)
Patient was noted to have CO2  20 initially with anion gap 18.  Suspect likely related to the acute renal failure. ?-Continue sodium bicarb drip ?-Recheck kidney function ?

## 2021-03-17 NOTE — Progress Notes (Signed)
Received patient from 5W. CHG bath completed and patient placed on ICU monitor. VS stable. Report given to night shift RN, Ginger Hinson.  ?

## 2021-03-17 NOTE — Progress Notes (Deleted)
EEG complete - results pending 

## 2021-03-17 NOTE — Assessment & Plan Note (Addendum)
Patient presents with creatinine of 4.53 with BUN 43 on admission.  Baseline creatinine has been around 0.7 last month.  Urinalysis had noted moderate hemoglobin with 0-5 WBCs and elevated liver enzymes question possibility of rhabdomyolysis. ?-Check CK ?-Check renal ultrasound ?-Avoid nephrotoxic agents such as lisinopril ?-Sodium bicarb IV fluids at 100 mL/h ?

## 2021-03-17 NOTE — Progress Notes (Signed)
Patient seen and examined at bedside ? ?BP 106/89 (BP Location: Left Arm)   Pulse (!) 119   Temp (!) 97.1 ?F (36.2 ?C) (Axillary)   Resp 18   Ht '6\' 1"'$  (1.854 m)   Wt 95.3 kg   SpO2 98%   BMI 27.72 kg/m?  ?BP cuff now on leg with normal Bps ?Intermittently garbled speech vs moaning-type speech. Having jerking motions occasionally raising arms off the bed and moving legs. Currently connected to cEEG. Somewhat responsive to verbal stimulation although keeping eyes closed. ?No accessory respiratory muscle use or distress, breathing comfortably on [place], CTAB. ? ?RN to ask neuro about movements; would base decision to give AED on EEG. Right now he is protecting his airway and not in acute distress. Con't to monitor. ? ?Julian Hy, DO 03/17/21 8:20 PM ?[facility] ? ?

## 2021-03-17 NOTE — ED Provider Notes (Signed)
[facility] EMERGENCY DEPARTMENT Provider Note   CSN: [id number] Arrival date & time: 03/17/21  0059     History  Chief Complaint  Patient presents with   Altered Mental Status   Level 5 caveat due to altered mental status Chase Caldwell is a 41 y.o. male.  The history is provided by the EMS personnel and the spouse.  Altered Mental Status Presenting symptoms: disorientation   Severity:  Severe Most recent episode:  Today Timing:  Constant Progression:  Worsening Chronicity:  New Patient with history of traumatic brain injury, spastic tetraplegia presents with altered mental status.  Patient presents from nursing facility.  Paramedics report the patient had increasing confusion over the past day.  No other details were available other than he received a COVID booster but symptoms had started prior to this episode  Home Medications Prior to Admission medications   Medication Sig Start Date End Date Taking? Authorizing Provider  acetaminophen (TYLENOL) 325 MG tablet Take 2 tablets (650 mg total) by mouth every 4 (four) hours as needed for mild pain (or temp > 37.5 C (99.5 F)). 10/26/20  Yes Samella Parr, NP  alum & mag hydroxide-simeth (MAALOX MAX) 400-400-40 MG/5ML suspension Take 30 mLs by mouth every 4 (four) hours as needed for indigestion.   Yes [provider]  aspirin 81 MG chewable tablet Chew 1 tablet (81 mg total) by mouth daily. 10/26/20  Yes Samella Parr, NP  baclofen (LIORESAL) 20 MG tablet TAKE 1 TABLET BY MOUTH THREE TIMES A DAY Patient taking differently: Take 20 mg by mouth 3 (three) times daily. 10/08/20  Yes Meredith Staggers, MD  clonazePAM (KLONOPIN) 1 MG tablet Take 1 tablet (1 mg total) by mouth every 6 (six) hours. Patient taking differently: Take 1 mg by mouth See admin instructions. Every 6 hours as needed for anxiety/anxiousness x 14 days 10/26/20  Yes Samella Parr, NP  dantrolene (DANTRIUM) 50 MG capsule Take 1 capsule  (50 mg total) by mouth 4 (four) times daily. 1 cap at bedtime for 3 days, 1 cap twice daily for 3 days, then 1 cap three x daily Patient taking differently: Take 50 mg by mouth 4 (four) times daily. 04/08/20  Yes Meredith Staggers, MD  docusate sodium (COLACE) 100 MG capsule Take 1 capsule (100 mg total) by mouth 2 (two) times daily. 11/03/20  Yes Samella Parr, NP  DULoxetine (CYMBALTA) 30 MG capsule Take 3 capsules (90 mg total) by mouth daily. 11/03/20  Yes Samella Parr, NP  gabapentin (NEURONTIN) 100 MG capsule Take 200 mg by mouth at bedtime. 07/12/20  Yes [provider]  lisinopril (ZESTRIL) 10 MG tablet Take 1 tablet (10 mg total) by mouth daily. 08/17/19  Yes Love, Ivan Anchors, PA-C  lithium carbonate 300 MG capsule Take 300 mg by mouth 2 (two) times daily. 07/06/20  Yes [provider]  loperamide (IMODIUM A-D) 2 MG tablet Take 2 mg by mouth every 6 (six) hours as needed for diarrhea or loose stools.   Yes [provider]  Multiple Vitamin (MULTIVITAMIN WITH MINERALS) TABS tablet Take 1 tablet by mouth daily. 08/17/19  Yes Love, Ivan Anchors, PA-C  Nutritional Supplements (NUTRITIONAL SUPPLEMENT PO) Take 120 mLs by mouth in the morning and at bedtime.   Yes [provider]  omeprazole (PRILOSEC) 40 MG capsule Take 40 mg by mouth daily. 05/04/19  Yes [provider]  ondansetron (ZOFRAN) 4 MG tablet Take 4 mg by mouth  every 6 (six) hours as needed for nausea or vomiting.   Yes [provider]  polyethylene glycol (MIRALAX / GLYCOLAX) 17 g packet Take 17 g by mouth at bedtime. 11/03/20  Yes Samella Parr, NP  QUEtiapine (SEROQUEL) 200 MG tablet Take 1 tablet (200 mg total) by mouth at bedtime. 11/03/20  Yes Samella Parr, NP  QUEtiapine (SEROQUEL) 50 MG tablet Take 1 tablet (50 mg total) by mouth daily. 10/26/20  Yes Samella Parr, NP  thiamine 100 MG tablet Take 1 tablet (100 mg total) by mouth daily. 08/17/19  Yes Love, Ivan Anchors, PA-C   traMADol (ULTRAM) 50 MG tablet Take 1 tablet (50 mg total) by mouth every 6 (six) hours as needed for moderate pain. Patient taking differently: Take 50 mg by mouth 3 (three) times daily. 10/26/20  Yes Samella Parr, NP  vitamin B-12 1000 MCG tablet Take 1 tablet (1,000 mcg total) by mouth daily. 08/17/19  Yes Love, Ivan Anchors, PA-C  Zinc Oxide (DESITIN) 40 % PSTE Apply 1 application. topically See admin instructions. Apply to sacrum every shift   Yes [provider]  nicotine (NICODERM CQ - DOSED IN MG/24 HOURS) 14 mg/24hr patch Place 1 patch (14 mg total) onto the skin daily. Patient not taking: Reported on 03/08/2021 10/26/20   Samella Parr, NP      Allergies    Tizanidine hcl and Tuberculin tests    Review of Systems   Review of Systems  Unable to perform ROS: Mental status change   Physical Exam Updated Vital Signs BP (!) 97/47    Pulse (!) 104    Temp 98.6 F (37 C) (Rectal)    Resp 12    Ht 1.854 m ('6\' 1"'$ )    Wt 95.3 kg    SpO2 94%    BMI 27.72 kg/m  Physical Exam CONSTITUTIONAL: Disheveled, ill-appearing HEAD: Normocephalic/atraumatic EYES: EOMI/PERRL, no nystagmus ENMT: Mucous membranes moist NECK: supple no meningeal signs SPINE/BACK:entire spine nontender CV: S1/S2 noted, no murmurs/rubs/gallops noted LUNGS: Lungs are clear to auscultation bilaterally, no apparent distress ABDOMEN: soft, nontender, nondistended GU: Patient wearing a diaper NEURO: Pt is awake but minimally responsive to voice and pain.  Arms are contracted and tremors are noted EXTREMITIES: pulses normal/equal, contractures noted SKIN: warm, color normal PSYCH: Unable to assess ED Results / Procedures / Treatments   Labs (all labs ordered are listed, but only abnormal results are displayed) Labs Reviewed  COMPREHENSIVE METABOLIC PANEL - Abnormal; Notable for the following components:      Result Value   CO2 20 (*)    Glucose, Bld 125 (*)    BUN 43 (*)    Creatinine, Ser 4.53 (*)     Albumin 2.8 (*)    AST 182 (*)    ALT 106 (*)    Alkaline Phosphatase 142 (*)    GFR, Estimated 16 (*)    Anion gap 18 (*)    All other components within normal limits  CBC WITH DIFFERENTIAL/PLATELET - Abnormal; Notable for the following components:   WBC 26.2 (*)    Platelets 772 (*)    Neutro Abs 23.2 (*)    Abs Immature Granulocytes 0.40 (*)    All other components within normal limits  PROTIME-INR - Abnormal; Notable for the following components:   Prothrombin Time 15.8 (*)    INR 1.3 (*)    All other components within normal limits  URINALYSIS, ROUTINE W REFLEX MICROSCOPIC - Abnormal; Notable for the  following components:   Color, Urine AMBER (*)    APPearance CLOUDY (*)    Glucose, UA 100 (*)    Hgb urine dipstick MODERATE (*)    Bilirubin Urine SMALL (*)    Protein, ur 100 (*)    Leukocytes,Ua SMALL (*)    All other components within normal limits  URINALYSIS, MICROSCOPIC (REFLEX) - Abnormal; Notable for the following components:   Bacteria, UA FEW (*)    All other components within normal limits  CBG MONITORING, ED - Abnormal; Notable for the following components:   Glucose-Capillary 123 (*)    All other components within normal limits  RESP PANEL BY RT-PCR (FLU A&B, COVID) ARPGX2  CULTURE, BLOOD (ROUTINE X 2)  CULTURE, BLOOD (ROUTINE X 2)  URINE CULTURE  LACTIC ACID, PLASMA  APTT  LITHIUM LEVEL  LACTIC ACID, PLASMA  BASIC METABOLIC PANEL    EKG EKG Interpretation  Date/Time:  Wednesday March 17 2021 01:06:15 EST Ventricular Rate:  113 PR Interval:  142 QRS Duration: 98 QT Interval:  382 QTC Calculation: 524 R Axis:   80 Text Interpretation: Sinus tachycardia Right atrial enlargement Consider right ventricular hypertrophy Prolonged QT interval Confirmed by Ripley Fraise [phone]) on 03/17/2021 1:08:51 AM  Radiology CT Head Wo Contrast  Result Date: 03/17/2021 CLINICAL DATA:  Delirium. EXAM: CT HEAD WITHOUT CONTRAST TECHNIQUE: Contiguous axial images  were obtained from the base of the skull through the vertex without intravenous contrast. RADIATION DOSE REDUCTION: This exam was performed according to the departmental dose-optimization program which includes automated exposure control, adjustment of the mA and/or kV according to patient size and/or use of iterative reconstruction technique. COMPARISON:  October 14, 2020 FINDINGS: Brain: There is mild cerebral atrophy with widening of the extra-axial spaces and ventricular dilatation. There are areas of decreased attenuation within the white matter tracts of the supratentorial brain, consistent with microvascular disease changes. Vascular: No hyperdense vessel or unexpected calcification. Skull: Postoperative changes are seen involving the facial bones on the right as well as the anterior wall of the frontal sinus on the right. Sinuses/Orbits: Moderate severity right-sided frontal sinus mucosal thickening is seen. Other: None. IMPRESSION: 1. No acute intracranial pathology. 2. Moderate severity right-sided frontal sinus disease. 3. Postoperative changes involving the right facial bones and anterior wall of the frontal sinus on the right. Electronically Signed   By: Virgina Norfolk M.D.   On: 03/17/2021 04:23   DG Chest Port 1 View  Result Date: 03/17/2021 CLINICAL DATA:  Questionable sepsis. Check for pulmonary infiltrate. EXAM: PORTABLE CHEST 1 VIEW COMPARISON:  Portable chest 03/08/2021. FINDINGS: The heart size and mediastinal contours are within normal limits. The lungs expiratory with the lower lung zones not well evaluated. The visualized lungs are generally clear apart from perihilar linear atelectatic bands. There is mild thoracic spine dextroscoliosis and spondylosis. There is overlying monitor wiring. IMPRESSION: Limited expiratory exam. The lower lung fields are not well evaluated. Visualized lungs are essentially clear. PA Lat exam in full inspiration recommended. Electronically Signed   By: Telford Nab M.D.   On: 03/17/2021 01:31    Procedures .Critical Care Performed by: Ripley Fraise, MD Authorized by: Ripley Fraise, MD   Critical care provider statement:    Critical care time (minutes):  117   Critical care start time:  03/17/2021 1:33 AM   Critical care end time:  03/17/2021 3:30 AM   Critical care time was exclusive of:  Separately billable procedures and treating other patients   Critical care  was necessary to treat or prevent imminent or life-threatening deterioration of the following conditions:  CNS failure or compromise, renal failure, shock, metabolic crisis and dehydration   Critical care was time spent personally by me on the following activities:  Evaluation of patient's response to treatment, examination of patient, re-evaluation of patient's condition, pulse oximetry, ordering and review of radiographic studies, ordering and review of laboratory studies, ordering and performing treatments and interventions, review of old charts, discussions with consultants and obtaining history from patient or surrogate   I assumed direction of critical care for this patient from another provider in my specialty: no     Care discussed with: admitting provider      Medications Ordered in ED Medications  lactated ringers bolus 1,000 mL (1,000 mLs Intravenous Not Given 03/17/21 0238)    And  lactated ringers bolus 1,000 mL (1,000 mLs Intravenous Not Given 03/17/21 0239)    And  lactated ringers bolus 1,000 mL (1,000 mLs Intravenous Not Given 03/17/21 0239)  ceFEPIme (MAXIPIME) 2 g in sodium chloride 0.9 % 100 mL IVPB (has no administration in time range)  lactated ringers bolus 1,000 mL (1,000 mLs Intravenous New Bag/Given 03/17/21 0136)  ceFEPIme (MAXIPIME) 2 g in sodium chloride 0.9 % 100 mL IVPB (0 g Intravenous Stopped 03/17/21 0238)  vancomycin (VANCOREADY) IVPB 2000 mg/400 mL (2,000 mg Intravenous New Bag/Given 03/17/21 0254)  sodium chloride 0.9 % bolus 2,000 mL (2,000 mLs  Intravenous New Bag/Given 03/17/21 0240)  sodium chloride 0.9 % bolus 1,000 mL (1,000 mLs Intravenous New Bag/Given 03/17/21 0455)    ED Course/ Medical Decision Making/ A&P Clinical Course as of 03/17/21 0530  Wed Mar 17, 2021  0128 Discussed with wife via phone.  She reports he is normally alert and talkative.  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  He reports he is typically taken outside multiple times a day to smoke cigarettes.  This appears to be an acute change in his mental status [DW]  0149 WBC(!): 26.2 Significant leukocytosis noted [DW]  0149 Patient becoming more hypotensive.  Mental status is actually improving, he is more responsive to voice.  Due to hypotension, will call code sepsis order IV antibiotics and IV fluid [DW]  0210 Creatinine(!): 4.53 Acute renal failure noted [DW]  0229 Patient is on lithium with renal failure.  We will need to give aggressive fluid resuscitation with normal saline.  Will have Foley catheter placed to monitor urine output.  Patient has received 1 L of fluid at this time. [DW]  8280 Discussed with ICU team.  Plan to give full 30 cc/kg and reassess blood pressure.  If there is no improvement he will need ICU level care [DW]  0526 CT head reviewed and negative.  Discussed with critical care for reevaluation.  Patient still mildly hypotensive in the 90s.  Still having minimal urine output.  We will continue normal saline for now.  Patient may end up requiring pressors.  Wife had confirmed that he would accept central line placement [DW]  0527 Lithium toxicity would be difficult to determine in this patient due to his underlying spasticity. [DW]  0349 At this point we will continue hydration and close monitoring of his urine output. [DW]    Clinical Course User Index [DW] Ripley Fraise, MD                           Medical Decision Making Amount and/or Complexity of Data Reviewed Labs:  ordered. Decision-making details documented in ED  Course. Radiology: ordered. ECG/medicine tests: ordered.  Risk Prescription drug management. Decision regarding hospitalization.   This patient presents to the ED for concern of altered mental status, this involves an extensive number of treatment options, and is a complaint that carries with it a high risk of complications and morbidity.  The differential diagnosis includes stroke, intracranial hemorrhage, subdural hematoma, electrolyte disturbance, toxidrome  Comorbidities that complicate the patient evaluation: Patients presentation is complicated by their history of traumatic brain injury  Social Determinants of Health: Patient lives in a nursing facility due to traumatic brain injury increases the complexity of managing their presentation  Additional history obtained: Additional history obtained from spouse Records reviewed previous admission documents  Lab Tests: I Ordered, and personally interpreted labs.  The pertinent results include: Acute renal failure  Imaging Studies ordered: I ordered imaging studies including CT scan head and X-ray chest   I independently visualized and interpreted imaging which showed no acute finding I agree with the radiologist interpretation  Cardiac Monitoring: The patient was maintained on a cardiac monitor.  I personally viewed and interpreted the cardiac monitor which showed an underlying rhythm of:  sinus tachycardia  Medicines ordered and prescription drug management: I ordered medication including fluid boluses for hypotension Reevaluation of the patient after these medicines showed that the patient    improved   Critical Interventions:  Multiple liters of IV fluid  Consultations Obtained: I requested consultation with the admitting physician critical care , and discussed  findings as well as pertinent plan - they recommend: Admission  Reevaluation: After the interventions noted above, I reevaluated the patient and found that  they have :improved  Complexity of problems addressed: Patients presentation is most consistent with  acute presentation with potential threat to life or bodily function  Disposition: After consideration of the diagnostic results and the patients response to treatment,  I feel that the patent would benefit from admission   .           Final Clinical Impression(s) / ED Diagnoses Final diagnoses:  Delirium  Dehydration  AKI (acute kidney injury) [facility])    Rx / [place] Orders ED Discharge Orders     None         Ripley Fraise, MD 03/17/21 [phone]

## 2021-03-17 NOTE — Assessment & Plan Note (Signed)
Lithium 0.9 ?-Holding lithium, Seroquel ?

## 2021-03-17 NOTE — Progress Notes (Signed)
vLTM started   all impedances below 10kohms  [facility] to monitor ?

## 2021-03-17 NOTE — ED Provider Notes (Signed)
Patient has been seen by critical care.  Since he is producing urine at this time his blood pressure is improving, he can go to progressive.  Discussed with Dr. Sidney Ace for admission to the hospitalist. ?He will except to the hospitalist service.  He was made aware of the hypotension that is improving and the need to continue IV fluids since patient is on lithium. ?BP (!) 103/58   Pulse (!) 104   Temp 98.6 ?F (37 ?C) (Rectal)   Resp 16   Ht 1.854 m ('6\' 1"'$ )   Wt 95.3 kg   SpO2 94%   BMI 27.72 kg/m?  ? ?  ?Ripley Fraise, MD ?03/17/21 [phone] ? ?

## 2021-03-17 NOTE — Assessment & Plan Note (Addendum)
Acute.  Initial QTc 524. ?-Avoid QT prolonging medications ?-Recheck EKG in a.m. ?

## 2021-03-17 NOTE — Progress Notes (Signed)
Stat EEG with generalized periodic discharges at 1 Hz ?We will continue LTM ?No need for antiepileptics at this time ?Recommendations as in the consult note from same date ? ?-- ?Amie Portland, MD ?Neurologist ?[facility] ?Pager: [phone] ? ? ?

## 2021-03-17 NOTE — Assessment & Plan Note (Addendum)
Acute.  On admission blood pressure was noted to be as low as 77/52, but improved after IV fluid boluses.  Patient appears to have received a total of 5 L of IV fluid. ?-Hold home blood pressure medications ?-Maintain goal MAP of 75 ?

## 2021-03-17 NOTE — Sepsis Progress Note (Signed)
Following for sepsis monitoring ?
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
## 2021-03-17 NOTE — Assessment & Plan Note (Addendum)
On admission patient was noted to be tachycardic and tachypneic with white blood cell count elevated at 26.2.  Initial lactic acid was within normal limits but repeat check 3.  Patient was noted to abnormal urinalysis with her for possible infection.  Blood cultures have been obtained.  As there was no clear source patient has been started on empiric antibiotics of vancomycin and cefepime. ?-Follow-up blood and urine cultures  ?-Continue empiric antibiotics of vancomycin.  Cefepime was held due to possibility of worsening mental status ?-Trend lactic acid levels ?-Recheck CBC in a.m. ?
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
## 2021-03-17 NOTE — Significant Event (Signed)
Rapid Response Event Note  ? ?Reason for Call :  ?Hypotension, and ICU tx orders ? ?Initial Focused Assessment:  ?Patient with AMS, myoclonic jerking. ?EEG in progress ?He has recently received ativan. ?BP 73/43  HR 107  RR 15  O2 sat 93-97%  ?Bicarb gtt infusing via right hand PIV ? ? ? ?Interventions:  ?Placed PIV left hand ?1L NS bolus ? ?Tx to 2M09 via bed ?Staff at bedside to receive patient ? ?Plan of Care:  ? ? ? ?Event Summary:  ? ?MD Notified: prior to my arrival ?Call Time: 1747 ?Arrival Time: 2863 ?End Time:  8177 ? ?Raliegh Ip, RN ?

## 2021-03-17 NOTE — Progress Notes (Addendum)
Pharmacy Antibiotic Note ? ?Chase Caldwell is a 41 y.o. male admitted on 03/17/2021 with sepsis. Pharmacy has been consulted for vancomycin and cefepime dosing. ? ?Pt suspected to have lithium toxicity per neurology even with normal level due to AKI. Neuro said to hold off right now. Cefepime is also suspected to be a contribution cause to his toxic encephalopathy and avoid cephalosporins. We will transition it to merrem to empiric coverage for sepsis/CNS infection. Change vanc to linezolid to avoid worsening AKI. [place] with Dr. Tamala Julian. He got vanc this AM but due to his AKI, it'll be in his system for several days. Will start zyvox on 3/10 ? ?Scr up to 4.48>>CrCL 25 ? ?Plan: ? ?Vanc>>Linezolid '600mg'$  IV q12 start 3/10 ?Merrem 1g IV q12 ? ?Height: '6\' 1"'$  (185.4 cm) ?Weight: 95.3 kg (210 lb 1.6 oz) ?IBW/kg (Calculated) : 79.9 ? ?Recent Labs  ?Lab 03/17/21 ?0126 03/17/21 ?0615 03/17/21 ?1241 03/17/21 ?1535  ?WBC 26.2*  --   --   --   ?CREATININE 4.53* 4.28*  --   --   ?LATICACIDVEN 1.5 3.0* 0.9 0.9  ? ?  ?Estimated Creatinine Clearance: 25.2 mL/min (A) (by C-G formula based on SCr of 4.28 mg/dL (H)).   ? ?Allergies  ?Allergen Reactions  ? Tizanidine Hcl   ?  Emotional lability  ? Tuberculin Tests   ? ? ?Antimicrobials this admission: ?Vancomycin 3/8 >> 3/8 ?Cefepime 3/8 >> 3/8 ?Merrem 3/8>> ?Zyvox 3/9>> ? ?Onnie Boer, PharmD, BCIDP, AAHIVP, CPP ?Infectious Disease Pharmacist ?03/17/2021 4:53 PM ? ? ?

## 2021-03-18 DIAGNOSIS — G9341 Metabolic encephalopathy: Secondary | ICD-10-CM | POA: Diagnosis not present

## 2021-03-18 DIAGNOSIS — N179 Acute kidney failure, unspecified: Secondary | ICD-10-CM | POA: Diagnosis not present

## 2021-03-18 DIAGNOSIS — F319 Bipolar disorder, unspecified: Secondary | ICD-10-CM | POA: Diagnosis not present

## 2021-03-18 DIAGNOSIS — G253 Myoclonus: Secondary | ICD-10-CM

## 2021-03-18 DIAGNOSIS — A419 Sepsis, unspecified organism: Secondary | ICD-10-CM | POA: Diagnosis not present

## 2021-03-18 LAB — GLUCOSE, CAPILLARY
Glucose-Capillary: 117 mg/dL — ABNORMAL HIGH (ref 70–99)
Glucose-Capillary: 60 mg/dL — ABNORMAL LOW (ref 70–99)
Glucose-Capillary: 60 mg/dL — ABNORMAL LOW (ref 70–99)
Glucose-Capillary: 66 mg/dL — ABNORMAL LOW (ref 70–99)
Glucose-Capillary: 66 mg/dL — ABNORMAL LOW (ref 70–99)
Glucose-Capillary: 72 mg/dL (ref 70–99)
Glucose-Capillary: 74 mg/dL (ref 70–99)
Glucose-Capillary: 75 mg/dL (ref 70–99)
Glucose-Capillary: 76 mg/dL (ref 70–99)
Glucose-Capillary: 98 mg/dL (ref 70–99)

## 2021-03-18 LAB — COMPREHENSIVE METABOLIC PANEL
ALT: 113 U/L — ABNORMAL HIGH (ref 0–44)
AST: 139 U/L — ABNORMAL HIGH (ref 15–41)
Albumin: 2.5 g/dL — ABNORMAL LOW (ref 3.5–5.0)
Alkaline Phosphatase: 100 U/L (ref 38–126)
Anion gap: 15 (ref 5–15)
BUN: 42 mg/dL — ABNORMAL HIGH (ref 6–20)
CO2: 23 mmol/L (ref 22–32)
Calcium: 8.8 mg/dL — ABNORMAL LOW (ref 8.9–10.3)
Chloride: 109 mmol/L (ref 98–111)
Creatinine, Ser: 2.41 mg/dL — ABNORMAL HIGH (ref 0.61–1.24)
GFR, Estimated: 33 mL/min — ABNORMAL LOW (ref >60)
Glucose, Bld: 67 mg/dL — ABNORMAL LOW (ref 70–99)
Potassium: 4.2 mmol/L (ref 3.5–5.1)
Sodium: 147 mmol/L — ABNORMAL HIGH (ref 135–145)
Total Bilirubin: 0.6 mg/dL (ref 0.3–1.2)
Total Protein: 6.8 g/dL (ref 6.5–8.1)

## 2021-03-18 LAB — CBC
HCT: 37.5 % — ABNORMAL LOW (ref 39.0–52.0)
Hemoglobin: 12.5 g/dL — ABNORMAL LOW (ref 13.0–17.0)
MCH: 29.5 pg (ref 26.0–34.0)
MCHC: 33.3 g/dL (ref 30.0–36.0)
MCV: 88.4 fL (ref 80.0–100.0)
Platelets: 681 10*3/uL — ABNORMAL HIGH (ref 150–400)
RBC: 4.24 MIL/uL (ref 4.22–5.81)
RDW: 13.7 % (ref 11.5–15.5)
WBC: 17.1 10*3/uL — ABNORMAL HIGH (ref 4.0–10.5)
nRBC: 0 % (ref 0.0–0.2)

## 2021-03-18 LAB — URINE CULTURE: Culture: NO GROWTH

## 2021-03-18 LAB — PROCALCITONIN: Procalcitonin: 0.39 ng/mL

## 2021-03-18 LAB — CK: Total CK: 2873 U/L — ABNORMAL HIGH (ref 49–397)

## 2021-03-18 MED ORDER — HYDROMORPHONE HCL 1 MG/ML IJ SOLN
0.4000 mg | INTRAMUSCULAR | Status: DC | PRN
  Administered 2021-03-18: 0.4 mg via INTRAVENOUS
  Filled 2021-03-18 (×2): qty 0.5

## 2021-03-18 MED ORDER — LORAZEPAM 2 MG/ML IJ SOLN
1.0000 mg | Freq: Once | INTRAMUSCULAR | Status: AC
  Administered 2021-03-18: 22:00:00 1 mg via INTRAVENOUS

## 2021-03-18 MED ORDER — LACTATED RINGERS IV SOLN: INTRAVENOUS | Status: DC

## 2021-03-18 MED ORDER — DEXTROSE 50 % IV SOLN
12.5000 g | INTRAVENOUS | Status: AC
  Administered 2021-03-18: 23:00:00 12.5 g via INTRAVENOUS

## 2021-03-18 MED ORDER — HYDROMORPHONE HCL 1 MG/ML IJ SOLN: 0.5000 mg | INTRAMUSCULAR | Status: DC | PRN

## 2021-03-18 MED ORDER — DEXTROSE 50 % IV SOLN: 12.5000 g | INTRAVENOUS | Status: AC

## 2021-03-18 MED ORDER — LORAZEPAM 2 MG/ML IJ SOLN
1.0000 mg | Freq: Three times a day (TID) | INTRAMUSCULAR | Status: DC
  Administered 2021-03-18 – 2021-03-20 (×8): 1 mg via INTRAVENOUS
  Filled 2021-03-18 (×8): qty 1

## 2021-03-18 MED ORDER — VALPROATE SODIUM 100 MG/ML IV SOLN
1500.0000 mg | Freq: Once | INTRAVENOUS | Status: AC
  Administered 2021-03-18: 12:00:00 1500 mg via INTRAVENOUS
  Filled 2021-03-18: qty 15

## 2021-03-18 MED ORDER — LORAZEPAM 2 MG/ML IJ SOLN
2.0000 mg | Freq: Once | INTRAMUSCULAR | Status: AC
  Administered 2021-03-18: 20:00:00 2 mg via INTRAVENOUS
  Filled 2021-03-18: qty 1

## 2021-03-18 MED ORDER — LORAZEPAM 2 MG/ML IJ SOLN
2.0000 mg | Freq: Once | INTRAMUSCULAR | Status: AC
  Administered 2021-03-18: 08:00:00 2 mg via INTRAVENOUS
  Filled 2021-03-18: qty 1

## 2021-03-18 MED ORDER — DEXTROSE 50 % IV SOLN
INTRAVENOUS | Status: AC
  Administered 2021-03-18: 10:00:00 50 mL via INTRAVENOUS
  Filled 2021-03-18: qty 50

## 2021-03-18 MED ORDER — DEXTROSE 50 % IV SOLN
INTRAVENOUS | Status: AC
  Filled 2021-03-18: qty 50

## 2021-03-18 MED ORDER — DEXTROSE IN LACTATED RINGERS 5 % IV SOLN: INTRAVENOUS | Status: DC

## 2021-03-18 MED ORDER — DEXTROSE 50 % IV SOLN: 1.0000 | Freq: Once | INTRAVENOUS | Status: AC

## 2021-03-18 MED ORDER — VALPROATE SODIUM 100 MG/ML IV SOLN
500.0000 mg | Freq: Two times a day (BID) | INTRAVENOUS | Status: DC
  Administered 2021-03-18 – 2021-03-21 (×7): 500 mg via INTRAVENOUS
  Filled 2021-03-18 (×10): qty 5

## 2021-03-18 MED ORDER — DEXTROSE 50 % IV SOLN
INTRAVENOUS | Status: AC
  Administered 2021-03-18: 08:00:00 12.5 g via INTRAVENOUS
  Filled 2021-03-18: qty 50

## 2021-03-18 NOTE — Progress Notes (Signed)
LTM EEG discontinued - no skin breakdown at unhook.   
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
## 2021-03-18 NOTE — Progress Notes (Signed)
LTM EEG maint complete - no skin breakdown under: Fp1 Fp2 reapplied multiple leads and wrapped head ?
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
## 2021-03-18 NOTE — Progress Notes (Addendum)
eLink Physician-Brief Progress Note ?Patient Name: Chase Caldwell ?DOB: Feb 08, 1980 ?MRN: [id number] ? ? ?Date of Service ? 03/18/2021  ?HPI/Events of Note ? Agitation and is due for MRI at 10 pm. It does not seem like he has been able to lay flat at all, all day. 2 mg ativan given around 745 pm did not work much either.   ?eICU Interventions ? Will give another 2 mg at around 945 pm in prep for the MRI. Otherwise will have to do this at a later time when he calms down. Placing a one time order so RN can give 2 mg in total.   ? ? ? ?Intervention Category ?Major Interventions: Delirium, psychosis, severe agitation - evaluation and management ? ?Lovenia Kim Kylene Zamarron ?03/18/2021, 8:27 PM ? ?Addendum at 11:40 pm ?Notified of pain all over and is NPO ?Has some AKI and is on linezolid as well ?Low dose dilaudid ordered for PRN use  ? ?

## 2021-03-18 NOTE — Procedures (Addendum)
Patient Name: Chase Caldwell  ?MRN: [id number]  ?Epilepsy Attending: Lora Havens  ?Referring Physician/Provider: Amie Portland, MD ?Duration: 03/17/2021 1850 to 03/18/2021 1124 ?  ?Patient history: 41 year old man with above past medical history presenting from his facility for altered mental status of at least 24 hours duration. EEG to evaluate for seizure ?  ?Level of alertness: lethargic  ?  ?AEDs during EEG study: None ?  ?Technical aspects: This EEG study was done with scalp electrodes positioned according to the 10-20 International system of electrode placement. Electrical activity was acquired at a sampling rate of '500Hz'$  and reviewed with a high frequency filter of '70Hz'$  and a low frequency filter of '1Hz'$ . EEG data were recorded continuously and digitally stored.  ?  ?Description: EEG showed continuous generalized 3 to 6 Hz theta-delta slowing. Generalized periodic discharges with triphasic morphology at  1-1.'5Hz'$ , more prominent when awake/stimulated were also noted. Hyperventilation and photic stimulation were not performed.    ? ?Patient event button was pressed on 03/17/2021 at 2045 during which patient was noted to have episodes of lifting up his head with truncal flexion every 4-5 seconds. Concomitant EEG before, during and after the event did not show any EEG changes to suggest seizure. ? ?Patient event button was pressed on 03/18/2021 at 0548.  During the event patient was noted to be laying in bed with eyes closed and had intermittent jerking in bilateral shoulders and neck.  Concomitant EEG showed generalized discharges at 1.5 Hz without definite evolution. ?  ?ABNORMALITY ?- Periodic discharges with triphasic morphology, generalized ( GPDs) ?- Continuous slow, generalized ?  ?IMPRESSION: ?This study is suggestive of moderate diffuse encephalopathy, nonspecific etiology but likely related to toxic-metabolic causes. No seizures or definite epileptiform discharges were seen throughout the recording. ? ?Patient  mental status on 03/17/2021 at 2045 during which patient was noted to have episodes of lifting up his head with truncal flexion every 4-5 seconds without concomitant EEG change.  These episodes were most likely not epileptic. ? ?Patient remembered and was pressed on 03/18/2020 at 0548 during which patient was noted to be laying in bed with eyes closed and had intermittent jerking in bilateral shoulders and neck without definitive EEG changes suggest seizure.  These episodes could be secondary to myoclonus. ?  ?Lora Havens  ?  ?

## 2021-03-18 NOTE — Progress Notes (Addendum)
PM UPDATE NOTE: ? ?UOP remains poor despite IVF @ 225cc/hr. ~350cc out since this morning.  ?Will increase LR to 200cc/hr and continue D5 @ 75cc/hr for total of 275cc/hr. Will monitor UOP closely and titrate up fluids for UOP goal of 95-285cc/hr.  ? ?Hypoglycemia improved since starting D5 ?Q4h CBGs. Adjust D5 as needed ? ?Toxic/metabolic encephalopathy ?No significant change since this morning. Continue current management ?Continue plans to obtain a brain MRI once patient is able to stay still. ? ?Attempted to contact family for update however unable to reach them. ? ?Mitzi Hansen, MD ?Pager # [phone] ?

## 2021-03-19 ENCOUNTER — Inpatient Hospital Stay (HOSPITAL_COMMUNITY): Payer: Medicaid Other

## 2021-03-19 DIAGNOSIS — G9341 Metabolic encephalopathy: Secondary | ICD-10-CM | POA: Diagnosis not present

## 2021-03-19 DIAGNOSIS — E8729 Other acidosis: Secondary | ICD-10-CM | POA: Diagnosis not present

## 2021-03-19 DIAGNOSIS — G8250 Quadriplegia, unspecified: Secondary | ICD-10-CM | POA: Diagnosis not present

## 2021-03-19 DIAGNOSIS — N179 Acute kidney failure, unspecified: Secondary | ICD-10-CM | POA: Diagnosis not present

## 2021-03-19 LAB — RENAL FUNCTION PANEL
Albumin: 2.2 g/dL — ABNORMAL LOW (ref 3.5–5.0)
Anion gap: 11 (ref 5–15)
BUN: 29 mg/dL — ABNORMAL HIGH (ref 6–20)
CO2: 25 mmol/L (ref 22–32)
Calcium: 8.3 mg/dL — ABNORMAL LOW (ref 8.9–10.3)
Chloride: 112 mmol/L — ABNORMAL HIGH (ref 98–111)
Creatinine, Ser: 1.36 mg/dL — ABNORMAL HIGH (ref 0.61–1.24)
GFR, Estimated: >60 mL/min (ref >60)
Glucose, Bld: 79 mg/dL (ref 70–99)
Phosphorus: 1.8 mg/dL — ABNORMAL LOW (ref 2.5–4.6)
Potassium: 3.1 mmol/L — ABNORMAL LOW (ref 3.5–5.1)
Sodium: 148 mmol/L — ABNORMAL HIGH (ref 135–145)

## 2021-03-19 LAB — GLUCOSE, CAPILLARY
Glucose-Capillary: 76 mg/dL (ref 70–99)
Glucose-Capillary: 81 mg/dL (ref 70–99)
Glucose-Capillary: 87 mg/dL (ref 70–99)
Glucose-Capillary: 90 mg/dL (ref 70–99)
Glucose-Capillary: 93 mg/dL (ref 70–99)

## 2021-03-19 LAB — CBC
HCT: 32.6 % — ABNORMAL LOW (ref 39.0–52.0)
Hemoglobin: 10.7 g/dL — ABNORMAL LOW (ref 13.0–17.0)
MCH: 28.7 pg (ref 26.0–34.0)
MCHC: 32.8 g/dL (ref 30.0–36.0)
MCV: 87.4 fL (ref 80.0–100.0)
Platelets: 492 10*3/uL — ABNORMAL HIGH (ref 150–400)
RBC: 3.73 MIL/uL — ABNORMAL LOW (ref 4.22–5.81)
RDW: 13.4 % (ref 11.5–15.5)
WBC: 12.3 10*3/uL — ABNORMAL HIGH (ref 4.0–10.5)
nRBC: 0 % (ref 0.0–0.2)

## 2021-03-19 MED ORDER — CHLORHEXIDINE GLUCONATE CLOTH 2 % EX PADS
6.0000 | MEDICATED_PAD | Freq: Every day | CUTANEOUS | Status: AC
  Administered 2021-03-22 – 2021-03-23 (×2): 6 via TOPICAL

## 2021-03-19 MED ORDER — AMLODIPINE BESYLATE 10 MG PO TABS
10.0000 mg | ORAL_TABLET | Freq: Every day | ORAL | Status: DC
  Administered 2021-03-19 – 2021-03-26 (×8): 10 mg via ORAL
  Filled 2021-03-19 (×8): qty 1

## 2021-03-19 MED ORDER — POTASSIUM CHLORIDE 10 MEQ/100ML IV SOLN
10.0000 meq | INTRAVENOUS | Status: AC
  Administered 2021-03-19 (×2): 10 meq via INTRAVENOUS
  Filled 2021-03-19 (×2): qty 100

## 2021-03-19 MED ORDER — POTASSIUM CHLORIDE 10 MEQ/100ML IV SOLN
10.0000 meq | INTRAVENOUS | Status: AC
  Administered 2021-03-19 (×4): 10 meq via INTRAVENOUS
  Filled 2021-03-19 (×4): qty 100

## 2021-03-19 MED ORDER — MUPIROCIN 2 % EX OINT
1.0000 "application " | TOPICAL_OINTMENT | Freq: Two times a day (BID) | CUTANEOUS | Status: AC
  Administered 2021-03-19 – 2021-03-23 (×10): 1 via NASAL
  Filled 2021-03-19 (×3): qty 22

## 2021-03-19 MED ORDER — SODIUM CHLORIDE 0.9% FLUSH
10.0000 mL | Freq: Two times a day (BID) | INTRAVENOUS | Status: DC
  Administered 2021-03-20 – 2021-03-26 (×11): 10 mL

## 2021-03-19 MED ORDER — SODIUM CHLORIDE 0.9% FLUSH: 10.0000 mL | INTRAVENOUS | Status: DC | PRN

## 2021-03-19 MED ORDER — POTASSIUM PHOSPHATES 15 MMOLE/5ML IV SOLN
30.0000 mmol | Freq: Once | INTRAVENOUS | Status: AC
  Administered 2021-03-19: 30 mmol via INTRAVENOUS
  Filled 2021-03-19: qty 10

## 2021-03-19 MED ORDER — MELATONIN 5 MG PO TABS
5.0000 mg | ORAL_TABLET | Freq: Every evening | ORAL | Status: DC | PRN
  Administered 2021-03-19 – 2021-03-20 (×2): 5 mg via ORAL
  Filled 2021-03-19 (×2): qty 1
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
## 2021-03-19 NOTE — TOC Progression Note (Signed)
Transition of Care (TOC) - Progression Note  ? ? ?Patient Details  ?Name: Fawzi Melman ?MRN: [id number] ?Date of Birth: 1980-04-06 ? ?Transition of Care (TOC) CM/SW Contact  ?Angelita Ingles, RN ?Phone Number:[phone] ? ?03/19/2021, 3:39 PM ? ?Clinical Narrative:    ? ?Transition of Care (TOC) Screening Note ? ? ?Patient Details  ?Name: Ediel Unangst ?Date of Birth: [birth year] ? ? ?Transition of Care (TOC) CM/SW Contact:    ?Angelita Ingles, RN ?Phone Number: ?03/19/2021, 3:39 PM ? ? ? ?Transition of Care Department [facility]) has reviewed patient and no TOC needs have been identified at this time. We will continue to monitor patient advancement through interdisciplinary progression rounds.  ? ? ? ? ?  ?  ? ?Expected Discharge Plan and Services ?  ?  ?  ?  ?  ?                ?  ?  ?  ?  ?  ?  ?  ?  ?  ?  ? ? ?Social Determinants of Health (SDOH) Interventions ?  ? ?Readmission Risk Interventions ?No flowsheet data found. ? ?
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
## 2021-03-19 NOTE — Progress Notes (Addendum)

## 2021-03-19 NOTE — Progress Notes (Signed)
K+ 3.1, phos 1.8 ?Replaced per protocol  ?

## 2021-03-19 NOTE — Progress Notes (Signed)
eLink Physician-Brief Progress Note ?Patient Name: Chase Caldwell ?DOB: 1980/12/10 ?MRN: [id number] ? ? ?Date of Service ? 03/19/2021  ?HPI/Events of Note ? Notified by RN that patient is very tearful tonight.  Pt also has not been sleeping.   ?eICU Interventions ? Trial of melatonin.  ?Psych consult in the morning.   ? ? ? ?Intervention Category ?Minor Interventions: Other: ? ?Elsie Lincoln ?03/19/2021, 11:47 PM ?

## 2021-03-19 NOTE — Progress Notes (Signed)
Modified Barium Swallow Progress Note ? ?Patient Details  ?Name: Chase Caldwell ?MRN: [id number] ?Date of Birth: 1980/05/19 ? ?Today's Date: 03/19/2021 ? ?Modified Barium Swallow completed.  Full report located under Chart Review in the Imaging Section. ? ?Brief recommendations include the following: ? ?Clinical Impression ? View of pharynx/larynx was somewhat limited due to pt's shoulders intermittently obstructing view and movements during study. Lingual residue characterized oral phase and piecemeal swallow pattern. He consumed consecutive straw sips thin which were penetrated into vestibule (PAS 3) due to decreased timing of protective mechanisms and exacerbated by continuous, multiple trials. Barium was viewed below vocal cords (aspirated) toward end of study likely from thin. Swallow was more coordinated and slower with nectar thick barium with one episode PAS 2 penetration. No significant residue. Given pt is a dependent feeder, impulsive and current compromised medical status, recommend nectar thick liquids, Dys 3, pills whole in puree, slow rate, small sips, ST to follow. ?  ?Swallow Evaluation Recommendations ? ?   ? ? SLP Diet Recommendations: Dysphagia 3 (Mech soft) solids;Nectar thick liquid ? ? Liquid Administration via: Straw ? ? Medication Administration: Whole meds with puree ? ? Supervision: Full supervision/cueing for compensatory strategies;Staff to assist with self feeding ? ? Compensations: Slow rate;Small sips/bites;Clear throat intermittently;Minimize environmental distractions ? ? Postural Changes: Seated upright at 90 degrees ? ? Oral Care Recommendations: Oral care BID ? ?   ? ? ? ?Houston Siren ?03/19/2021,3:22 PM ?
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
## 2021-03-19 NOTE — Progress Notes (Signed)
Spoke to Dr. Rory Percy Neurologist. Can hold off on MRI 2/2 improving encephalopathy. Will likely sign off but is available if needed.  ? ?Gerlene Fee, DO ?03/19/2021, 10:36 AM ?PGY-3, [place] ? ?
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
## 2021-03-20 ENCOUNTER — Encounter (HOSPITAL_COMMUNITY): Payer: Self-pay | Admitting: Family Medicine

## 2021-03-20 LAB — GLUCOSE, CAPILLARY
Glucose-Capillary: 78 mg/dL (ref 70–99)
Glucose-Capillary: 69 mg/dL — ABNORMAL LOW (ref 70–99)
Glucose-Capillary: 79 mg/dL (ref 70–99)
Glucose-Capillary: 78 mg/dL (ref 70–99)
Glucose-Capillary: 106 mg/dL — ABNORMAL HIGH (ref 70–99)
Glucose-Capillary: 87 mg/dL (ref 70–99)

## 2021-03-20 LAB — RENAL FUNCTION PANEL
Albumin: 2.2 g/dL — ABNORMAL LOW (ref 3.5–5.0)
Anion gap: 11 (ref 5–15)
BUN: 20 mg/dL (ref 6–20)
CO2: 23 mmol/L (ref 22–32)
Calcium: 8 mg/dL — ABNORMAL LOW (ref 8.9–10.3)
Chloride: 111 mmol/L (ref 98–111)
Creatinine, Ser: 0.85 mg/dL (ref 0.61–1.24)
GFR, Estimated: >60 mL/min (ref >60)
Glucose, Bld: 70 mg/dL (ref 70–99)
Phosphorus: 3.7 mg/dL (ref 2.5–4.6)
Potassium: 3.7 mmol/L (ref 3.5–5.1)
Sodium: 145 mmol/L (ref 135–145)

## 2021-03-20 LAB — CK: Total CK: 663 U/L — ABNORMAL HIGH (ref 49–397)

## 2021-03-20 LAB — MAGNESIUM: Magnesium: 1.6 mg/dL — ABNORMAL LOW (ref 1.7–2.4)

## 2021-03-20 MED ORDER — MAGNESIUM SULFATE 2 GM/50ML IV SOLN
2.0000 g | Freq: Once | INTRAVENOUS | Status: AC
  Administered 2021-03-20: 2 g via INTRAVENOUS
  Filled 2021-03-20: qty 50

## 2021-03-20 MED ORDER — LORAZEPAM 1 MG PO TABS
2.0000 mg | ORAL_TABLET | Freq: Every day | ORAL | Status: DC
  Administered 2021-03-20 – 2021-03-22 (×3): 2 mg via ORAL
  Filled 2021-03-20 (×3): qty 2
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
## 2021-03-21 LAB — CBC WITH DIFFERENTIAL/PLATELET
Abs Immature Granulocytes: 0.09 10*3/uL — ABNORMAL HIGH (ref 0.00–0.07)
Basophils Absolute: 0.1 10*3/uL (ref 0.0–0.1)
Basophils Relative: 0 %
Eosinophils Absolute: 0.4 10*3/uL (ref 0.0–0.5)
Eosinophils Relative: 4 %
HCT: 36.7 % — ABNORMAL LOW (ref 39.0–52.0)
Hemoglobin: 11.9 g/dL — ABNORMAL LOW (ref 13.0–17.0)
Immature Granulocytes: 1 %
Lymphocytes Relative: 26 %
Lymphs Abs: 3 10*3/uL (ref 0.7–4.0)
MCH: 28.6 pg (ref 26.0–34.0)
MCHC: 32.4 g/dL (ref 30.0–36.0)
MCV: 88.2 fL (ref 80.0–100.0)
Monocytes Absolute: 0.6 10*3/uL (ref 0.1–1.0)
Monocytes Relative: 5 %
Neutro Abs: 7.6 10*3/uL (ref 1.7–7.7)
Neutrophils Relative %: 64 %
Platelets: 475 10*3/uL — ABNORMAL HIGH (ref 150–400)
RBC: 4.16 MIL/uL — ABNORMAL LOW (ref 4.22–5.81)
RDW: 13.7 % (ref 11.5–15.5)
WBC: 11.8 10*3/uL — ABNORMAL HIGH (ref 4.0–10.5)
nRBC: 0 % (ref 0.0–0.2)

## 2021-03-21 LAB — BASIC METABOLIC PANEL
Anion gap: 8 (ref 5–15)
BUN: 14 mg/dL (ref 6–20)
CO2: 26 mmol/L (ref 22–32)
Calcium: 7.7 mg/dL — ABNORMAL LOW (ref 8.9–10.3)
Chloride: 112 mmol/L — ABNORMAL HIGH (ref 98–111)
Creatinine, Ser: 0.78 mg/dL (ref 0.61–1.24)
GFR, Estimated: >60 mL/min (ref >60)
Glucose, Bld: 95 mg/dL (ref 70–99)
Potassium: 3.5 mmol/L (ref 3.5–5.1)
Sodium: 146 mmol/L — ABNORMAL HIGH (ref 135–145)

## 2021-03-21 LAB — GLUCOSE, CAPILLARY
Glucose-Capillary: 83 mg/dL (ref 70–99)
Glucose-Capillary: 87 mg/dL (ref 70–99)
Glucose-Capillary: 79 mg/dL (ref 70–99)
Glucose-Capillary: 90 mg/dL (ref 70–99)
Glucose-Capillary: 98 mg/dL (ref 70–99)

## 2021-03-21 LAB — MAGNESIUM: Magnesium: 1.9 mg/dL (ref 1.7–2.4)

## 2021-03-21 MED ORDER — MAGNESIUM SULFATE 2 GM/50ML IV SOLN
2.0000 g | Freq: Once | INTRAVENOUS | Status: AC
  Administered 2021-03-21: 2 g via INTRAVENOUS
  Filled 2021-03-21: qty 50

## 2021-03-21 MED ORDER — LORAZEPAM 2 MG/ML IJ SOLN
1.0000 mg | Freq: Once | INTRAMUSCULAR | Status: AC
  Administered 2021-03-21: 1 mg via INTRAVENOUS
  Filled 2021-03-21: qty 1

## 2021-03-21 MED ORDER — POTASSIUM CHLORIDE 20 MEQ PO PACK
40.0000 meq | PACK | Freq: Once | ORAL | Status: AC
  Administered 2021-03-21: 40 meq
  Filled 2021-03-21: qty 2

## 2021-03-21 MED ORDER — LORAZEPAM 1 MG PO TABS
1.0000 mg | ORAL_TABLET | Freq: Three times a day (TID) | ORAL | Status: DC
  Administered 2021-03-21 – 2021-03-22 (×5): 1 mg via ORAL
  Filled 2021-03-21 (×5): qty 1

## 2021-03-21 MED ORDER — POTASSIUM CHLORIDE CRYS ER 20 MEQ PO TBCR
40.0000 meq | EXTENDED_RELEASE_TABLET | Freq: Once | ORAL | Status: DC
  Filled 2021-03-21: qty 2
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
## 2021-03-21 NOTE — Progress Notes (Signed)
HOSPITAL MEDICINE OVERNIGHT EVENT NOTE   ? ?Nursing reports the patient continues to exhibit agitation throughout the evening shift despite administration of 2 mg of oral Ativan at 9 PM. ? ?Patient is regularly attempting to get out of bed, pulling at medical devices and impeding medical care.  There is no sitter available. ? ?Administration of Haldol is not an option as the patient has a documented known prolonged Qtc (albeit slight).  We will administer an additional dose of 1 mg of intravenous Ativan, monitor for symptomatic improvement. ? ?Vernelle Emerald  MD ?[facility] [employer]  ? ? ? ? ? ? ? ? ? ? ?

## 2021-03-21 NOTE — Progress Notes (Signed)
Called Wife Erubiel Manasco and notified her of patient's movement to [id number]. ?
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
## 2021-03-21 NOTE — Progress Notes (Signed)
Gave report to Yvette Rack, RN at 2011. Went to get bed and room wasn't cleaned yet. Room is currently being cleaned, and 6N will be calling when room is ready. ?

## 2021-03-22 LAB — CULTURE, BLOOD (ROUTINE X 2)
Culture: NO GROWTH
Culture: NO GROWTH
Special Requests: ADEQUATE
Special Requests: ADEQUATE

## 2021-03-22 LAB — BASIC METABOLIC PANEL
Anion gap: 9 (ref 5–15)
BUN: 8 mg/dL (ref 6–20)
CO2: 26 mmol/L (ref 22–32)
Calcium: 8 mg/dL — ABNORMAL LOW (ref 8.9–10.3)
Chloride: 114 mmol/L — ABNORMAL HIGH (ref 98–111)
Creatinine, Ser: 0.71 mg/dL (ref 0.61–1.24)
GFR, Estimated: >60 mL/min (ref >60)
Glucose, Bld: 83 mg/dL (ref 70–99)
Potassium: 3.6 mmol/L (ref 3.5–5.1)
Sodium: 149 mmol/L — ABNORMAL HIGH (ref 135–145)

## 2021-03-22 LAB — MAGNESIUM: Magnesium: 2.1 mg/dL (ref 1.7–2.4)

## 2021-03-22 LAB — SODIUM
Sodium: 149 mmol/L — ABNORMAL HIGH (ref 135–145)
Sodium: 148 mmol/L — ABNORMAL HIGH (ref 135–145)

## 2021-03-22 LAB — GLUCOSE, CAPILLARY
Glucose-Capillary: 87 mg/dL (ref 70–99)
Glucose-Capillary: 78 mg/dL (ref 70–99)
Glucose-Capillary: 90 mg/dL (ref 70–99)
Glucose-Capillary: 75 mg/dL (ref 70–99)

## 2021-03-22 LAB — VALPROIC ACID LEVEL: Valproic Acid Lvl: 12 ug/mL — ABNORMAL LOW (ref 50.0–100.0)

## 2021-03-22 MED ORDER — DANTROLENE SODIUM 25 MG PO CAPS
50.0000 mg | ORAL_CAPSULE | Freq: Four times a day (QID) | ORAL | Status: DC
  Administered 2021-03-22 – 2021-03-26 (×17): 50 mg via ORAL
  Filled 2021-03-22 (×19): qty 2

## 2021-03-22 MED ORDER — DEXTROSE 5 % IV SOLN: INTRAVENOUS | Status: AC

## 2021-03-22 MED ORDER — DEXTROSE-NACL 2.5-0.45 % IV SOLN
INTRAVENOUS | Status: DC
Start: 2021-03-22 — End: 2021-03-22

## 2021-03-22 MED ORDER — QUETIAPINE FUMARATE 50 MG PO TABS
25.0000 mg | ORAL_TABLET | Freq: Every day | ORAL | Status: DC
  Administered 2021-03-22 – 2021-03-23 (×2): 25 mg via ORAL
  Filled 2021-03-22 (×2): qty 1

## 2021-03-22 MED ORDER — VALPROATE SODIUM 100 MG/ML IV SOLN
500.0000 mg | Freq: Two times a day (BID) | INTRAVENOUS | Status: DC
  Administered 2021-03-22 – 2021-03-23 (×3): 500 mg via INTRAVENOUS
  Filled 2021-03-22 (×5): qty 5

## 2021-03-22 MED ORDER — VALPROIC ACID 250 MG/5ML PO SOLN
500.0000 mg | Freq: Two times a day (BID) | ORAL | Status: DC
  Filled 2021-03-22: qty 10

## 2021-03-22 MED ORDER — LORAZEPAM 2 MG/ML IJ SOLN
0.5000 mg | Freq: Once | INTRAMUSCULAR | Status: AC
  Administered 2021-03-22: 0.5 mg via INTRAVENOUS
  Filled 2021-03-22: qty 1
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
## 2021-03-22 NOTE — Plan of Care (Signed)
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
## 2021-03-22 NOTE — Consult Note (Signed)
Brief Psychiatry Consult Note  ?The patient was last seen by the psychiatry service on 03/21/21; please see that note for full consult (also detailed consult note from 10/30/21).  Was admitted for AMS variably documented as lithium toxicity; although he did have significant AKI lithium level was at 0.96 at time of admission and downtrended; this is within the therapeutic range and it is unclear why AMS is being attributed to lithium toxicity.  Qtc was elevated initially however has since downtrended.  ? ?Exam ?Saw patient briefly in late afternoon. Was lying diagonally in bed with gown askew. Oriented to self, month and year but not situation (thought he was here for a stroke). 3/4 on CAM-ICU. He did deny SI, HI, AH/VH however was fixated on getting into friend's truck (appeared delusional) and at one point appeared to be hallucinating his wife in the corner of the room. Notably more confused than on my prior interactions in late 2022 and Dr. Beckey Downing interactions yesterday.  Qtc is normalizing - will start low dose of quetiapine in PM.  ? ? ?Recs ?- start quetiapine 25 mg QHS.  ?- continue depakote 500 BID - noted last albumin is 2.5 mg (not targeting therapeutic level) ?- level today was 12 however unclear dosing yesterday, would repeat in 1-2 days without changing dose.  ? ?- unclear if ativan is home med - no recent outpt fills (although in LTC). Likely would taper to see if mental status improves; was not taking in Oct 2022 - had PRN klonopin during that hospitalization. Will need to contact wife.  ? ? ?Angila Wombles A Sukari Grist ? ?

## 2021-03-23 LAB — BASIC METABOLIC PANEL
Anion gap: 9 (ref 5–15)
BUN: 6 mg/dL (ref 6–20)
CO2: 22 mmol/L (ref 22–32)
Calcium: 7.9 mg/dL — ABNORMAL LOW (ref 8.9–10.3)
Chloride: 115 mmol/L — ABNORMAL HIGH (ref 98–111)
Creatinine, Ser: 0.65 mg/dL (ref 0.61–1.24)
GFR, Estimated: >60 mL/min (ref >60)
Glucose, Bld: 74 mg/dL (ref 70–99)
Potassium: 4 mmol/L (ref 3.5–5.1)
Sodium: 146 mmol/L — ABNORMAL HIGH (ref 135–145)

## 2021-03-23 LAB — GLUCOSE, CAPILLARY
Glucose-Capillary: 70 mg/dL (ref 70–99)
Glucose-Capillary: 73 mg/dL (ref 70–99)
Glucose-Capillary: 75 mg/dL (ref 70–99)
Glucose-Capillary: 76 mg/dL (ref 70–99)
Glucose-Capillary: 81 mg/dL (ref 70–99)
Glucose-Capillary: 81 mg/dL (ref 70–99)
Glucose-Capillary: 86 mg/dL (ref 70–99)

## 2021-03-23 MED ORDER — LORAZEPAM 0.5 MG PO TABS
0.5000 mg | ORAL_TABLET | Freq: Three times a day (TID) | ORAL | Status: DC
  Administered 2021-03-23 – 2021-03-25 (×7): 0.5 mg via ORAL
  Filled 2021-03-23 (×7): qty 1

## 2021-03-23 MED ORDER — LORAZEPAM 1 MG PO TABS
1.0000 mg | ORAL_TABLET | Freq: Every day | ORAL | Status: DC
Start: 2021-03-23 — End: 2021-03-25
  Administered 2021-03-23 – 2021-03-24 (×2): 1 mg via ORAL
  Filled 2021-03-23 (×2): qty 1

## 2021-03-23 MED ORDER — DIVALPROEX SODIUM 500 MG PO DR TAB
500.0000 mg | DELAYED_RELEASE_TABLET | Freq: Two times a day (BID) | ORAL | Status: DC
  Administered 2021-03-23 – 2021-03-26 (×6): 500 mg via ORAL
  Filled 2021-03-23 (×7): qty 1
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
## 2021-03-24 DIAGNOSIS — G9341 Metabolic encephalopathy: Secondary | ICD-10-CM | POA: Diagnosis not present

## 2021-03-24 LAB — BASIC METABOLIC PANEL
Anion gap: 8 (ref 5–15)
BUN: 5 mg/dL — ABNORMAL LOW (ref 6–20)
CO2: 27 mmol/L (ref 22–32)
Calcium: 7.9 mg/dL — ABNORMAL LOW (ref 8.9–10.3)
Chloride: 109 mmol/L (ref 98–111)
Creatinine, Ser: 0.63 mg/dL (ref 0.61–1.24)
GFR, Estimated: >60 mL/min (ref >60)
Glucose, Bld: 86 mg/dL (ref 70–99)
Potassium: 3.8 mmol/L (ref 3.5–5.1)
Sodium: 144 mmol/L (ref 135–145)

## 2021-03-24 LAB — GLUCOSE, CAPILLARY
Glucose-Capillary: 65 mg/dL — ABNORMAL LOW (ref 70–99)
Glucose-Capillary: 68 mg/dL — ABNORMAL LOW (ref 70–99)
Glucose-Capillary: 73 mg/dL (ref 70–99)
Glucose-Capillary: 74 mg/dL (ref 70–99)
Glucose-Capillary: 76 mg/dL (ref 70–99)
Glucose-Capillary: 83 mg/dL (ref 70–99)
Glucose-Capillary: 83 mg/dL (ref 70–99)
Glucose-Capillary: 91 mg/dL (ref 70–99)

## 2021-03-24 MED ORDER — QUETIAPINE FUMARATE 50 MG PO TABS
50.0000 mg | ORAL_TABLET | Freq: Every day | ORAL | Status: DC
Start: 2021-03-24 — End: 2021-03-25
  Administered 2021-03-24: 50 mg via ORAL
  Filled 2021-03-24: qty 1
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
## 2021-03-24 NOTE — Plan of Care (Signed)
?  Problem: Coping: ?Goal: Level of anxiety will decrease ?Outcome: Progressing ?  ?Problem: Activity: ?Goal: Risk for activity intolerance will decrease ?Outcome: Progressing ?  ?Problem: Activity: ?Goal: Risk for activity intolerance will decrease ?Outcome: Progressing ?  ?Problem: Elimination: ?Goal: Will not experience complications related to bowel motility ?Outcome: Progressing ?Goal: Will not experience complications related to urinary retention ?Outcome: Progressing ?  ?
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
## 2021-03-25 DIAGNOSIS — G9341 Metabolic encephalopathy: Secondary | ICD-10-CM | POA: Diagnosis not present

## 2021-03-25 LAB — COMPREHENSIVE METABOLIC PANEL
ALT: 39 U/L (ref 0–44)
AST: 27 U/L (ref 15–41)
Albumin: 2.4 g/dL — ABNORMAL LOW (ref 3.5–5.0)
Alkaline Phosphatase: 62 U/L (ref 38–126)
Anion gap: 11 (ref 5–15)
BUN: <5 mg/dL — ABNORMAL LOW (ref 6–20)
CO2: 24 mmol/L (ref 22–32)
Calcium: 7.9 mg/dL — ABNORMAL LOW (ref 8.9–10.3)
Chloride: 104 mmol/L (ref 98–111)
Creatinine, Ser: 0.54 mg/dL — ABNORMAL LOW (ref 0.61–1.24)
GFR, Estimated: >60 mL/min (ref >60)
Glucose, Bld: 74 mg/dL (ref 70–99)
Potassium: 4 mmol/L (ref 3.5–5.1)
Sodium: 139 mmol/L (ref 135–145)
Total Bilirubin: 0.5 mg/dL (ref 0.3–1.2)
Total Protein: 5.8 g/dL — ABNORMAL LOW (ref 6.5–8.1)

## 2021-03-25 LAB — VALPROIC ACID LEVEL: Valproic Acid Lvl: 46 ug/mL — ABNORMAL LOW (ref 50.0–100.0)

## 2021-03-25 LAB — GLUCOSE, CAPILLARY
Glucose-Capillary: 74 mg/dL (ref 70–99)
Glucose-Capillary: 75 mg/dL (ref 70–99)
Glucose-Capillary: 84 mg/dL (ref 70–99)
Glucose-Capillary: 86 mg/dL (ref 70–99)
Glucose-Capillary: 86 mg/dL (ref 70–99)
Glucose-Capillary: 86 mg/dL (ref 70–99)

## 2021-03-25 MED ORDER — ACETAMINOPHEN 325 MG PO TABS
650.0000 mg | ORAL_TABLET | ORAL | Status: DC | PRN
  Administered 2021-03-25: 650 mg via ORAL
  Filled 2021-03-25: qty 2

## 2021-03-25 MED ORDER — GABAPENTIN 100 MG PO CAPS
200.0000 mg | ORAL_CAPSULE | Freq: Every day | ORAL | Status: DC
  Administered 2021-03-25: 200 mg via ORAL
  Filled 2021-03-25: qty 2

## 2021-03-25 MED ORDER — DULOXETINE HCL 30 MG PO CPEP
90.0000 mg | ORAL_CAPSULE | Freq: Every day | ORAL | Status: DC
  Administered 2021-03-25: 90 mg via ORAL
  Filled 2021-03-25: qty 1

## 2021-03-25 MED ORDER — CLONAZEPAM 0.5 MG PO TABS
1.0000 mg | ORAL_TABLET | Freq: Four times a day (QID) | ORAL | Status: DC
  Administered 2021-03-25 – 2021-03-26 (×5): 1 mg via ORAL
  Filled 2021-03-25 (×5): qty 2

## 2021-03-25 MED ORDER — LOPERAMIDE HCL 2 MG PO CAPS
2.0000 mg | ORAL_CAPSULE | ORAL | Status: DC | PRN
  Administered 2021-03-25: 2 mg via ORAL
  Filled 2021-03-25: qty 1

## 2021-03-25 MED ORDER — DULOXETINE HCL 20 MG PO CPEP
40.0000 mg | ORAL_CAPSULE | Freq: Every day | ORAL | Status: DC
  Administered 2021-03-26: 40 mg via ORAL
  Filled 2021-03-25: qty 2

## 2021-03-25 MED ORDER — QUETIAPINE FUMARATE 50 MG PO TABS
100.0000 mg | ORAL_TABLET | Freq: Every day | ORAL | Status: DC
  Administered 2021-03-25: 100 mg via ORAL
  Filled 2021-03-25: qty 2

## 2021-03-25 MED ORDER — DOCUSATE SODIUM 100 MG PO CAPS
100.0000 mg | ORAL_CAPSULE | Freq: Two times a day (BID) | ORAL | Status: DC
  Filled 2021-03-25: qty 1

## 2021-03-25 MED ORDER — ASPIRIN 81 MG PO CHEW
81.0000 mg | CHEWABLE_TABLET | Freq: Every day | ORAL | Status: DC
  Administered 2021-03-25 – 2021-03-26 (×2): 81 mg via ORAL
  Filled 2021-03-25 (×2): qty 1

## 2021-03-25 NOTE — TOC Progression Note (Signed)
Transition of Care (TOC) - Progression Note  ? ? ?Patient Details  ?Name: Chase Caldwell ?MRN: [id number] ?Date of Birth: 19-Sep-1980 ? ?Transition of Care (TOC) CM/SW Contact  ?Emeterio Reeve, LCSW ?Phone Number: ?03/25/2021, 3:13 PM ? ?Clinical Narrative:    ? ?CSW spoke to [facility], they are able to accept pt tomorrow at DC. Pt will need a new covid test and FL2.  ? ?Expected Discharge Plan: [place] ?Barriers to Discharge: Continued Medical Work up ? ?Expected Discharge Plan and Services ?Expected Discharge Plan: [place] ?  ?  ?  ?Living arrangements for the past 2 months: [place] ?                ?  ?  ?  ?  ?  ?  ?  ?  ?  ?  ? ? ?Social Determinants of Health (SDOH) Interventions ?  ? ?Readmission Risk Interventions ?No flowsheet data found. ? ?Emeterio Reeve, LCSW ?Clinical Social Worker ? ?
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
## 2021-03-26 ENCOUNTER — Inpatient Hospital Stay (HOSPITAL_COMMUNITY): Payer: Medicaid Other

## 2021-03-26 DIAGNOSIS — G9341 Metabolic encephalopathy: Secondary | ICD-10-CM | POA: Diagnosis not present

## 2021-03-26 LAB — GLUCOSE, CAPILLARY
Glucose-Capillary: 111 mg/dL — ABNORMAL HIGH (ref 70–99)
Glucose-Capillary: 72 mg/dL (ref 70–99)
Glucose-Capillary: 72 mg/dL (ref 70–99)
Glucose-Capillary: 75 mg/dL (ref 70–99)

## 2021-03-26 LAB — RESP PANEL BY RT-PCR (FLU A&B, COVID) ARPGX2
Influenza A by PCR: NEGATIVE
Influenza B by PCR: NEGATIVE
SARS Coronavirus 2 by RT PCR: NEGATIVE

## 2021-03-26 MED ORDER — QUETIAPINE FUMARATE 100 MG PO TABS: 100.0000 mg | ORAL_TABLET | Freq: Every day | ORAL | Status: AC

## 2021-03-26 MED ORDER — TRAMADOL HCL 50 MG PO TABS: 50.0000 mg | ORAL_TABLET | Freq: Four times a day (QID) | ORAL | 0 refills | Status: AC | PRN

## 2021-03-26 MED ORDER — GABAPENTIN 100 MG PO CAPS: 200.0000 mg | ORAL_CAPSULE | Freq: Every day | ORAL | 0 refills | Status: AC

## 2021-03-26 MED ORDER — DANTROLENE SODIUM 50 MG PO CAPS: 50.0000 mg | ORAL_CAPSULE | Freq: Four times a day (QID) | ORAL | 0 refills | Status: AC

## 2021-03-26 MED ORDER — DIVALPROEX SODIUM 500 MG PO DR TAB
500.0000 mg | DELAYED_RELEASE_TABLET | Freq: Two times a day (BID) | ORAL | 0 refills | Status: AC
Start: 2021-03-26 — End: 2021-04-25

## 2021-03-26 MED ORDER — DULOXETINE HCL 40 MG PO CPEP: 40.0000 mg | ORAL_CAPSULE | Freq: Every day | ORAL | 0 refills | Status: AC

## 2021-03-26 MED ORDER — BACLOFEN 20 MG PO TABS: 20.0000 mg | ORAL_TABLET | Freq: Three times a day (TID) | ORAL | 0 refills | Status: AC

## 2021-03-26 MED ORDER — CLONAZEPAM 1 MG PO TABS: 1.0000 mg | ORAL_TABLET | Freq: Four times a day (QID) | ORAL | 0 refills | Status: AC

## 2021-03-26 NOTE — Progress Notes (Signed)
Modified Barium Swallow Progress Note ? ?Patient Details  ?Name: Chase Caldwell ?MRN: [id number] ?Date of Birth: 1980-05-14 ? ?Today's Date: 03/26/2021 ? ?Modified Barium Swallow completed.  Full report located under Chart Review in the Imaging Section. ? ?Brief recommendations include the following: ? ?Clinical Impression ? Pt was sitting upright with a clear view of his pharynx/larynx today, also more interactive and per chart, reportedly at/near his baseline. He has slow lingual propulsion orally and small amounts of lingual residue that he mostly clears. He has reduced anterior hyoid movement, base of tongue retraction, and epiglottic inversion. He does invert his epiglottis more complete at times, particularly with heavier boluses or consecutive sips, although a little slower. He ends up having trace amounts of penetration, mostly during the swallow, with all consistencies tested, including thin liquids and nectar thick liquids as well as purees and solids. Although it does not clear spontaneously (PAS 3), he can eject most of it with a cued cough. Even when allowed to drink at his own pacing, he had no aspiration. Recommend advancing liquids back to thin, as this is his baseline, and the use of thickener does not appear to have a significant impact on functional swallowing at this time. ?  ?Swallow Evaluation Recommendations ? ?   ? ? SLP Diet Recommendations: Dysphagia 3 (Mech soft) solids;Thin liquid ? ? Liquid Administration via: Straw ? ? Medication Administration: Whole meds with puree ? ? Supervision: Full supervision/cueing for compensatory strategies;Staff to assist with self feeding ? ? Compensations: Slow rate;Small sips/bites;Clear throat intermittently;Minimize environmental distractions ? ? Postural Changes: Seated upright at 90 degrees;Remain semi-upright after after feeds/meals (Comment) ? ? Oral Care Recommendations: Oral care BID ? ?   ? ? ? ?Osie Bond., M.A. CCC-SLP ?Acute Rehabilitation  Services ?Pager [phone] ?Office [phone] ? ?03/26/2021,1:50 PM ?
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
## 2021-03-26 NOTE — Progress Notes (Signed)
Pt discharged to [facility]. DC instructions called and given to Pascal, male nurse at [facility]. All of nurse's questions answered to his satisfaction. Left my number [phone] with male nurse to call me in case of any questions. Pt left unit on stretcher pushed by [facility] staff. Left in stable condition.  ?
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
## 2021-03-26 NOTE — TOC Transition Note (Signed)
Transition of Care (TOC) - CM/SW Discharge Note ? ? ?Patient Details  ?Name: Chase Caldwell ?MRN: [id number] ?Date of Birth: 06-15-1980 ? ?Transition of Care (TOC) CM/SW Contact:  ?Emeterio Reeve, LCSW ?Phone Number: ?03/26/2021, 12:53 PM ? ? ?Clinical Narrative:    ? ?Per MD patient ready for DC to [facility], patient, patient's family, and facility notified of DC. Discharge Summary and FL2 sent to facility. DC packet on chart. I Ambulance transport requested for patient.  ?  ?RN to call report to [phone] ? ?CSW will sign off for now as social work intervention is no longer needed. Please consult [place] again if new needs arise. ? ? ?Final next level of care: [place] ?Barriers to Discharge: Barriers Resolved ? ? ?Patient Goals and CMS Choice ?  ?  ?  ? ?Discharge Placement ?  ?           ?Patient chooses bed at: [facility] ?Patient to be transferred to facility by: [facility]tar ?Name of family member notified: Wife ?Patient and family notified of of transfer: 03/26/21 ? ?Discharge Plan and Services ?  ?  ?           ?  ?  ?  ?  ?  ?  ?  ?  ?  ?  ? ?Social Determinants of Health (SDOH) Interventions ?  ? ? ?Readmission Risk Interventions ?No flowsheet data found. ? ?Emeterio Reeve, LCSW ?Clinical Social Worker ? ? ? ?

## 2021-10-05 ENCOUNTER — Other Ambulatory Visit: Payer: Self-pay

## 2021-10-05 ENCOUNTER — Emergency Department (HOSPITAL_COMMUNITY): Payer: Medicaid Other

## 2021-10-05 ENCOUNTER — Emergency Department (HOSPITAL_COMMUNITY)
Admission: EM | Admit: 2021-10-05 | Discharge: 2021-10-06 | Disposition: A | Discharge status: Skilled Nursing Facility | Payer: Medicaid Other | Attending: Emergency Medicine | Admitting: Emergency Medicine

## 2021-10-05 DIAGNOSIS — Z7982 Long term (current) use of aspirin: Secondary | ICD-10-CM | POA: Insufficient documentation

## 2021-10-05 DIAGNOSIS — L89159 Pressure ulcer of sacral region, unspecified stage: Secondary | ICD-10-CM | POA: Insufficient documentation

## 2021-10-05 DIAGNOSIS — S31000A Unspecified open wound of lower back and pelvis without penetration into retroperitoneum, initial encounter: Secondary | ICD-10-CM

## 2021-10-05 MED ORDER — OXYCODONE-ACETAMINOPHEN 5-325 MG PO TABS
1.0000 | ORAL_TABLET | Freq: Once | ORAL | Status: AC
  Administered 2021-10-06: 1 via ORAL
  Filled 2021-10-05: qty 1

## 2021-10-05 NOTE — ED Notes (Signed)
Assisted EDP as a chaperone for EDP to assessment of wound to buttocks (performed in a private area, not hallway.

## 2021-10-05 NOTE — ED Triage Notes (Signed)
Pt arrived via [facility] for wound check. Pt called EMS from personal cell requesting transport to hospital for assessment of known pressure ulcer on left buttocks that has been present for 2 months, but is worsening despite daily wound care. Pt is wheelchair bound with limb contraction/extension, PMH TBI. DNR. GCS 15, A&Ox4.   PTA EMS Vitals  HR 68 NS BP 120/72 SPO2 99% RA  RR 18 Temp 97.3 CBG 91

## 2021-10-05 NOTE — ED Provider Notes (Signed)
[facility] EMERGENCY DEPARTMENT Provider Note   CSN: [id number] Arrival date & time: 10/05/21  2123     History {Add pertinent medical, surgical, social history, OB history to HPI:1} Chief Complaint  Patient presents with   Wound Check    Chase Caldwell is a 41 y.o. male.  Patient with history of TBI with spastic tetraplegia presents today from SNF requesting wound check. He states that he has a pressure ulcer on his left buttock that has been present for the past 1.5 months but has been causing him worsening pain recently. He states that his wound has been cleaned and dressed by his facility but he does not feel that they are managing it appropriately and therefore he called EMS for transport. He denies any fevers or chills.   The history is provided by the patient. No language interpreter was used.  Wound Check       Home Medications Prior to Admission medications   Medication Sig Start Date End Date Taking? Authorizing Provider  acetaminophen (TYLENOL) 325 MG tablet Take 2 tablets (650 mg total) by mouth every 4 (four) hours as needed for mild pain (or temp > 37.5 C (99.5 F)). 10/26/20   Samella Parr, NP  alum & mag hydroxide-simeth (MAALOX MAX) 409-811-91 MG/5ML suspension Take 30 mLs by mouth every 4 (four) hours as needed for indigestion.    [provider]  aspirin 81 MG chewable tablet Chew 1 tablet (81 mg total) by mouth daily. 10/26/20   Samella Parr, NP  baclofen (LIORESAL) 20 MG tablet Take 1 tablet (20 mg total) by mouth 3 (three) times daily. 03/26/21   Darliss Cheney, MD  clonazePAM (KLONOPIN) 1 MG tablet Take 1 tablet (1 mg total) by mouth every 6 (six) hours. 03/26/21   Darliss Cheney, MD  dantrolene (DANTRIUM) 50 MG capsule Take 1 capsule (50 mg total) by mouth 4 (four) times daily. 1 cap at bedtime for 3 days, 1 cap twice daily for 3 days, then 1 cap three x daily 03/26/21   Darliss Cheney, MD  divalproex (DEPAKOTE) 500 MG DR tablet  Take 1 tablet (500 mg total) by mouth every 12 (twelve) hours. 03/26/21 04/25/21  Darliss Cheney, MD  docusate sodium (COLACE) 100 MG capsule Take 1 capsule (100 mg total) by mouth 2 (two) times daily. 11/03/20   Samella Parr, NP  DULoxetine 40 MG CPEP Take 40 mg by mouth daily. 03/26/21 04/25/21  Darliss Cheney, MD  gabapentin (NEURONTIN) 100 MG capsule Take 2 capsules (200 mg total) by mouth at bedtime. 03/26/21   Darliss Cheney, MD  lisinopril (ZESTRIL) 10 MG tablet Take 1 tablet (10 mg total) by mouth daily. 08/17/19   Love, Ivan Anchors, PA-C  loperamide (IMODIUM A-D) 2 MG tablet Take 2 mg by mouth every 6 (six) hours as needed for diarrhea or loose stools.    [provider]  Multiple Vitamin (MULTIVITAMIN WITH MINERALS) TABS tablet Take 1 tablet by mouth daily. 08/17/19   Love, Ivan Anchors, PA-C  Nutritional Supplements (NUTRITIONAL SUPPLEMENT PO) Take 120 mLs by mouth in the morning and at bedtime.    [provider]  omeprazole (PRILOSEC) 40 MG capsule Take 40 mg by mouth daily. 05/04/19   [provider]  ondansetron (ZOFRAN) 4 MG tablet Take 4 mg by mouth every 6 (six) hours as needed for nausea or vomiting.    [provider]  QUEtiapine (SEROQUEL) 100 MG tablet Take 1 tablet (100 mg total) by  mouth at bedtime. 03/26/21   Darliss Cheney, MD  thiamine 100 MG tablet Take 1 tablet (100 mg total) by mouth daily. 08/17/19   Love, Ivan Anchors, PA-C  traMADol (ULTRAM) 50 MG tablet Take 1 tablet (50 mg total) by mouth every 6 (six) hours as needed for moderate pain. 03/26/21   Darliss Cheney, MD  vitamin B-12 1000 MCG tablet Take 1 tablet (1,000 mcg total) by mouth daily. 08/17/19   Love, Ivan Anchors, PA-C  Zinc Oxide (DESITIN) 40 % PSTE Apply 1 application. topically See admin instructions. Apply to sacrum every shift    [provider]      Allergies    Tizanidine hcl and Tuberculin tests    Review of Systems   Review of Systems  Skin:  Positive for wound.  All other  systems reviewed and are negative.   Physical Exam Updated Vital Signs BP 108/70   Pulse 68   Temp 98.3 F (36.8 C) (Oral)   Resp 15   SpO2 98%  Physical Exam Vitals and nursing note reviewed.  Constitutional:      General: He is not in acute distress.    Appearance: Normal appearance. He is normal weight. He is not ill-appearing, toxic-appearing or diaphoretic.  HENT:     Head: Normocephalic and atraumatic.  Cardiovascular:     Rate and Rhythm: Normal rate.  Pulmonary:     Effort: Pulmonary effort is normal. No respiratory distress.  Musculoskeletal:        General: Normal range of motion.     Cervical back: Normal range of motion.  Skin:    General: Skin is warm and dry.     Comments: Small punctate wound located over the sacrum which drains trace amount of serous fluid with palpation. No erythema, warmth, fluctuance, or induration. Wound with sacral pad overlying the wound with today's date on the dressing. Dressing is not saturated. See images below for further.  Neurological:     General: No focal deficit present.     Mental Status: He is alert.  Psychiatric:        Mood and Affect: Mood normal.        Behavior: Behavior normal.     ED Results / Procedures / Treatments   Labs (all labs ordered are listed, but only abnormal results are displayed) Labs Reviewed  CBC WITH DIFFERENTIAL/PLATELET  COMPREHENSIVE METABOLIC PANEL  LACTIC ACID, PLASMA  LACTIC ACID, PLASMA    EKG None  Radiology DG Sacrum/Coccyx  Result Date: 10/05/2021 CLINICAL DATA:  Sacral wound EXAM: SACRUM AND COCCYX - 2+ VIEW COMPARISON:  None Available. FINDINGS: There is no evidence of fracture or other focal bone lesions. No visible bone destruction. SI joints symmetric. Degenerative changes at L5-S1 with disc space narrowing and spurring. IMPRESSION: No acute bony abnormality. Electronically Signed   By: Rolm Baptise M.D.   On: 10/05/2021 23:01     Procedures Procedures  {Document cardiac  monitor, telemetry assessment procedure when appropriate:1}  Medications Ordered in ED Medications  oxyCODONE-acetaminophen (PERCOCET/ROXICET) 5-325 MG per tablet 1 tablet (has no administration in time range)    ED Course/ Medical Decision Making/ A&P                           Medical Decision Making Amount and/or Complexity of Data Reviewed Labs: ordered.   Patient presents today with complaints of pain from sacral pressure wound. He is afebrile, non-toxic appearing, and in no  acute distress with reassuring vital signs. Physical exam reveals punctate wound with trace amount of serous fluid draining with palpation. No signs of infection, no erythema, or warmth. No fluctuance or induration. Patient comes from a SNF where he is getting wound care. He apparently called EMS for transport because he did not feel management was adequate.   X-ray imaging obtained of the sacrum and coccyx which was unremarkable for acute findings. Patient with laboratory evaluation which is pending at shift change. If this is normal, patient will be stable for discharge back to SNF.   Care handoff to [facility], PA-C at shift change. Please see their note for further information and dispo.   Final Clinical Impression(s) / ED Diagnoses Final diagnoses:  Wound of sacral region, initial encounter    Rx / DC Orders ED Discharge Orders     None

## 2021-10-06 LAB — COMPREHENSIVE METABOLIC PANEL
ALT: 9 U/L (ref 0–44)
AST: 13 U/L — ABNORMAL LOW (ref 15–41)
Albumin: 3.3 g/dL — ABNORMAL LOW (ref 3.5–5.0)
Alkaline Phosphatase: 66 U/L (ref 38–126)
Anion gap: 9 (ref 5–15)
BUN: <5 mg/dL — ABNORMAL LOW (ref 6–20)
CO2: 26 mmol/L (ref 22–32)
Calcium: 9 mg/dL (ref 8.9–10.3)
Chloride: 103 mmol/L (ref 98–111)
Creatinine, Ser: 0.6 mg/dL — ABNORMAL LOW (ref 0.61–1.24)
GFR, Estimated: >60 mL/min (ref >60)
Glucose, Bld: 88 mg/dL (ref 70–99)
Potassium: 3.6 mmol/L (ref 3.5–5.1)
Sodium: 138 mmol/L (ref 135–145)
Total Bilirubin: 0.6 mg/dL (ref 0.3–1.2)
Total Protein: 6.3 g/dL — ABNORMAL LOW (ref 6.5–8.1)

## 2021-10-06 LAB — LACTIC ACID, PLASMA: Lactic Acid, Venous: 0.8 mmol/L (ref 0.5–1.9)

## 2021-10-06 LAB — CBC WITH DIFFERENTIAL/PLATELET
Abs Immature Granulocytes: 0.02 10*3/uL (ref 0.00–0.07)
Basophils Absolute: 0 10*3/uL (ref 0.0–0.1)
Basophils Relative: 1 %
Eosinophils Absolute: 0.2 10*3/uL (ref 0.0–0.5)
Eosinophils Relative: 4 %
HCT: 42.5 % (ref 39.0–52.0)
Hemoglobin: 14.3 g/dL (ref 13.0–17.0)
Immature Granulocytes: 0 %
Lymphocytes Relative: 37 %
Lymphs Abs: 2.2 10*3/uL (ref 0.7–4.0)
MCH: 30.9 pg (ref 26.0–34.0)
MCHC: 33.6 g/dL (ref 30.0–36.0)
MCV: 91.8 fL (ref 80.0–100.0)
Monocytes Absolute: 0.3 10*3/uL (ref 0.1–1.0)
Monocytes Relative: 5 %
Neutro Abs: 3.1 10*3/uL (ref 1.7–7.7)
Neutrophils Relative %: 53 %
Platelets: 216 10*3/uL (ref 150–400)
RBC: 4.63 MIL/uL (ref 4.22–5.81)
RDW: 14.6 % (ref 11.5–15.5)
WBC: 5.9 10*3/uL (ref 4.0–10.5)
nRBC: 0 % (ref 0.0–0.2)

## 2021-10-06 NOTE — ED Provider Notes (Signed)
Clinical Course as of 10/06/21 0121  Wed Oct 06, 2021  0120 Labs reviewed and are normal; stable compared to baseline. Spoke with wife of the patient regarding reassuring evaluation and plan for discharge. Wife in agreement with plan. All questions answered. [KH]    Clinical Course User Index [KH] Antonietta Breach, PA-C   Results for orders placed or performed during the hospital encounter of 10/05/21  CBC with Differential  Result Value Ref Range   WBC 5.9 4.0 - 10.5 K/uL   RBC 4.63 4.22 - 5.81 MIL/uL   Hemoglobin 14.3 13.0 - 17.0 g/dL   HCT 42.5 39.0 - 52.0 %   MCV 91.8 80.0 - 100.0 fL   MCH 30.9 26.0 - 34.0 pg   MCHC 33.6 30.0 - 36.0 g/dL   RDW 14.6 11.5 - 15.5 %   Platelets 216 150 - 400 K/uL   nRBC 0.0 0.0 - 0.2 %   Neutrophils Relative % 53 %   Neutro Abs 3.1 1.7 - 7.7 K/uL   Lymphocytes Relative 37 %   Lymphs Abs 2.2 0.7 - 4.0 K/uL   Monocytes Relative 5 %   Monocytes Absolute 0.3 0.1 - 1.0 K/uL   Eosinophils Relative 4 %   Eosinophils Absolute 0.2 0.0 - 0.5 K/uL   Basophils Relative 1 %   Basophils Absolute 0.0 0.0 - 0.1 K/uL   Immature Granulocytes 0 %   Abs Immature Granulocytes 0.02 0.00 - 0.07 K/uL  Comprehensive metabolic panel  Result Value Ref Range   Sodium 138 135 - 145 mmol/L   Potassium 3.6 3.5 - 5.1 mmol/L   Chloride 103 98 - 111 mmol/L   CO2 26 22 - 32 mmol/L   Glucose, Bld 88 70 - 99 mg/dL   BUN <5 (L) 6 - 20 mg/dL   Creatinine, Ser 0.60 (L) 0.61 - 1.24 mg/dL   Calcium 9.0 8.9 - 10.3 mg/dL   Total Protein 6.3 (L) 6.5 - 8.1 g/dL   Albumin 3.3 (L) 3.5 - 5.0 g/dL   AST 13 (L) 15 - 41 U/L   ALT 9 0 - 44 U/L   Alkaline Phosphatase 66 38 - 126 U/L   Total Bilirubin 0.6 0.3 - 1.2 mg/dL   GFR, Estimated >60 >60 mL/min   Anion gap 9 5 - 15  Lactic acid, plasma  Result Value Ref Range   Lactic Acid, Venous 0.8 0.5 - 1.9 mmol/L   DG Sacrum/Coccyx  Result Date: 10/05/2021 CLINICAL DATA:  Sacral wound EXAM: SACRUM AND COCCYX - 2+ VIEW COMPARISON:  None  Available. FINDINGS: There is no evidence of fracture or other focal bone lesions. No visible bone destruction. SI joints symmetric. Degenerative changes at L5-S1 with disc space narrowing and spurring. IMPRESSION: No acute bony abnormality. Electronically Signed   By: Rolm Baptise M.D.   On: 10/05/2021 23:01      Antonietta Breach, Hershal Coria 10/06/21 0121    Ripley Fraise, MD 10/06/21 [phone]

## 2021-10-06 NOTE — Discharge Instructions (Signed)
Continue with basic wound care. Change your dressing at least once per day to keep the area clean and reduce risk of infection. Follow up with your primary care doctor for wound recheck in 1 week.

## 2022-02-15 ENCOUNTER — Emergency Department (HOSPITAL_COMMUNITY): Payer: Medicaid Other

## 2022-02-15 ENCOUNTER — Emergency Department (HOSPITAL_COMMUNITY)
Admission: EM | Admit: 2022-02-15 | Discharge: 2022-02-16 | Disposition: A | Discharge status: Skilled Nursing Facility | Payer: Medicaid Other | Attending: Emergency Medicine | Admitting: Emergency Medicine

## 2022-02-15 ENCOUNTER — Other Ambulatory Visit: Payer: Self-pay

## 2022-02-15 DIAGNOSIS — Z79899 Other long term (current) drug therapy: Secondary | ICD-10-CM | POA: Insufficient documentation

## 2022-02-15 DIAGNOSIS — Z7982 Long term (current) use of aspirin: Secondary | ICD-10-CM | POA: Insufficient documentation

## 2022-02-15 DIAGNOSIS — R55 Syncope and collapse: Secondary | ICD-10-CM | POA: Insufficient documentation

## 2022-02-15 LAB — CBG MONITORING, ED: Glucose-Capillary: 90 mg/dL (ref 70–99)

## 2022-02-15 MED ORDER — SODIUM CHLORIDE 0.9 % IV SOLN: INTRAVENOUS | Status: DC

## 2022-02-15 MED ORDER — SODIUM CHLORIDE 0.9 % IV SOLN: 1000.0000 mL | INTRAVENOUS | Status: DC

## 2022-02-15 MED ORDER — SODIUM CHLORIDE 0.9 % IV BOLUS
1000.0000 mL | Freq: Once | INTRAVENOUS | Status: AC
  Administered 2022-02-15: 1000 mL via INTRAVENOUS

## 2022-02-15 MED ORDER — SODIUM CHLORIDE 0.9 % IV BOLUS (SEPSIS): 500.0000 mL | Freq: Once | INTRAVENOUS | Status: DC

## 2022-02-15 NOTE — ED Notes (Addendum)
Pt bp 96/77, MD notified and fluids ordered.

## 2022-02-15 NOTE — ED Triage Notes (Signed)
Pt bib [facility] from [facility]. Pt ordered pizza and took a shower and when exiting shower had syncopal episode. On ems arrival pts bp 94/50. Ems gave 54m bolus bp went up to 110/62. Cbg 92,92% RA ,Rr 14. Ems gcs 13-12. Hx of TBI

## 2022-02-15 NOTE — ED Notes (Signed)
Patient transported to CT 
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
## 2022-02-16 LAB — BASIC METABOLIC PANEL
Anion gap: 8 (ref 5–15)
BUN: 10 mg/dL (ref 6–20)
CO2: 24 mmol/L (ref 22–32)
Calcium: 8.3 mg/dL — ABNORMAL LOW (ref 8.9–10.3)
Chloride: 103 mmol/L (ref 98–111)
Creatinine, Ser: 0.72 mg/dL (ref 0.61–1.24)
GFR, Estimated: >60 mL/min (ref >60)
Glucose, Bld: 88 mg/dL (ref 70–99)
Potassium: 4.6 mmol/L (ref 3.5–5.1)
Sodium: 135 mmol/L (ref 135–145)

## 2022-02-16 LAB — CBC WITH DIFFERENTIAL/PLATELET
Abs Immature Granulocytes: 0.01 10*3/uL (ref 0.00–0.07)
Basophils Absolute: 0.1 10*3/uL (ref 0.0–0.1)
Basophils Relative: 1 %
Eosinophils Absolute: 0.2 10*3/uL (ref 0.0–0.5)
Eosinophils Relative: 2 %
HCT: 44.2 % (ref 39.0–52.0)
Hemoglobin: 14.7 g/dL (ref 13.0–17.0)
Immature Granulocytes: 0 %
Lymphocytes Relative: 29 %
Lymphs Abs: 2.1 10*3/uL (ref 0.7–4.0)
MCH: 31.3 pg (ref 26.0–34.0)
MCHC: 33.3 g/dL (ref 30.0–36.0)
MCV: 94 fL (ref 80.0–100.0)
Monocytes Absolute: 0.3 10*3/uL (ref 0.1–1.0)
Monocytes Relative: 4 %
Neutro Abs: 4.6 10*3/uL (ref 1.7–7.7)
Neutrophils Relative %: 64 %
Platelets: 225 10*3/uL (ref 150–400)
RBC: 4.7 MIL/uL (ref 4.22–5.81)
RDW: 14.7 % (ref 11.5–15.5)
WBC: 7.2 10*3/uL (ref 4.0–10.5)
nRBC: 0 % (ref 0.0–0.2)

## 2022-02-16 LAB — URINALYSIS, ROUTINE W REFLEX MICROSCOPIC
Bilirubin Urine: NEGATIVE
Glucose, UA: NEGATIVE mg/dL
Hgb urine dipstick: NEGATIVE
Ketones, ur: 5 mg/dL — AB
Leukocytes,Ua: NEGATIVE
Nitrite: NEGATIVE
Protein, ur: NEGATIVE mg/dL
Specific Gravity, Urine: 1.006 (ref 1.005–1.030)
pH: 6 (ref 5.0–8.0)

## 2022-02-16 LAB — LACTIC ACID, PLASMA
Lactic Acid, Venous: 1.5 mmol/L (ref 0.5–1.9)
Lactic Acid, Venous: 0.8 mmol/L (ref 0.5–1.9)

## 2022-02-16 NOTE — Discharge Instructions (Signed)
Was seen today for passing out.  Your workup is largely reassuring.  Make sure that you are drinking plenty of fluids.

## 2022-02-16 NOTE — ED Provider Notes (Signed)
Patient signed out pending lab work.  Had a syncopal episode.  Labs obtained and reviewed.  Largely reassuring without any significant anemia or metabolic derangements.  On recheck, patient states he feels better after fluid replacement.  Blood pressure remains soft although his baseline blood pressures are systolics 216-244C. based on chart review.  Patient states he feels comfortable going home.  Physical Exam  BP 96/65   Pulse 64   Temp 97.6 F (36.4 C) (Oral)   Resp 11   SpO2 100%   Physical Exam Chronically ill-appearing but nontoxic  Procedures  Procedures  ED Course / MDM   Clinical Course as of 02/16/22 0551  Tue Feb 15, 2022  2338 CT and C-spine CT without acute findings. [JK]    Clinical Course User Index [JK] Dorie Rank, MD   Medical Decision Making Amount and/or Complexity of Data Reviewed Labs: ordered. Radiology: ordered.  Risk Prescription drug management.   Problem List Items Addressed This Visit   None Visit Diagnoses     Syncope and collapse    -  Primary             Dina Rich Barbette Hair, MD 02/16/22 [phone]

## 2022-04-07 ENCOUNTER — Emergency Department (HOSPITAL_COMMUNITY): Payer: Medicaid Other

## 2022-04-07 ENCOUNTER — Other Ambulatory Visit: Payer: Self-pay

## 2022-04-07 ENCOUNTER — Emergency Department (HOSPITAL_COMMUNITY)
Admission: EM | Admit: 2022-04-07 | Discharge: 2022-04-07 | Disposition: A | Discharge status: Skilled Nursing Facility | Payer: Medicaid Other | Attending: Emergency Medicine | Admitting: Emergency Medicine

## 2022-04-07 ENCOUNTER — Encounter (HOSPITAL_COMMUNITY): Payer: Self-pay | Admitting: Emergency Medicine

## 2022-04-07 DIAGNOSIS — S0083XA Contusion of other part of head, initial encounter: Secondary | ICD-10-CM

## 2022-04-07 DIAGNOSIS — W19XXXA Unspecified fall, initial encounter: Secondary | ICD-10-CM | POA: Diagnosis not present

## 2022-04-07 DIAGNOSIS — Z79899 Other long term (current) drug therapy: Secondary | ICD-10-CM | POA: Diagnosis not present

## 2022-04-07 DIAGNOSIS — Y92009 Unspecified place in unspecified non-institutional (private) residence as the place of occurrence of the external cause: Secondary | ICD-10-CM | POA: Insufficient documentation

## 2022-04-07 DIAGNOSIS — S0081XA Abrasion of other part of head, initial encounter: Secondary | ICD-10-CM | POA: Diagnosis not present

## 2022-04-07 DIAGNOSIS — Z7982 Long term (current) use of aspirin: Secondary | ICD-10-CM | POA: Diagnosis not present

## 2022-04-07 DIAGNOSIS — S0990XA Unspecified injury of head, initial encounter: Secondary | ICD-10-CM | POA: Diagnosis present

## 2022-04-07 MED ORDER — ACETAMINOPHEN 325 MG PO TABS
650.0000 mg | ORAL_TABLET | Freq: Once | ORAL | Status: AC
  Administered 2022-04-07: 650 mg via ORAL
  Filled 2022-04-07: qty 2

## 2022-04-07 NOTE — ED Triage Notes (Addendum)
Pt BIB [facility] from [facility] after a fall while staff was transferring him from bed to wheelchair, abrasion to right temple, hx of TBI, neuro at baseline per staff, denies LOC, (-) blood thinners, pt reports pain 10/10 to right side of head   V/s en route: 118/78, HR 86

## 2022-04-07 NOTE — ED Notes (Signed)
Call placed to [facility] for transport back to [facility]

## 2022-04-07 NOTE — ED Provider Notes (Signed)
[place] EMERGENCY DEPARTMENT AT [facility] Provider Note   CSN: [id number] Arrival date & time: 04/07/22  1109     History  Chief Complaint  Patient presents with   Chase Caldwell is a 42 y.o. male.  42 year old male presents after falling at his place of residence.  Patient is wheelchair-bound and was being transferred when he was dropped.  No loss of consciousness.  Initially had no complaints but complains of mild headache.  No emesis noted.  Denies any pains to his body at this time.  Does not take any blood thinners.  Transported here for further evaluation       Home Medications Prior to Admission medications   Medication Sig Start Date End Date Taking? Authorizing Provider  acetaminophen (TYLENOL) 325 MG tablet Take 2 tablets (650 mg total) by mouth every 4 (four) hours as needed for mild pain (or temp > 37.5 C (99.5 F)). 10/26/20   Samella Parr, NP  alum & mag hydroxide-simeth (MAALOX MAX) [id number] MG/5ML suspension Take 30 mLs by mouth every 4 (four) hours as needed for indigestion.    [provider]  aspirin 81 MG chewable tablet Chew 1 tablet (81 mg total) by mouth daily. 10/26/20   Samella Parr, NP  baclofen (LIORESAL) 20 MG tablet Take 1 tablet (20 mg total) by mouth 3 (three) times daily. 03/26/21   Darliss Cheney, MD  clonazePAM (KLONOPIN) 1 MG tablet Take 1 tablet (1 mg total) by mouth every 6 (six) hours. 03/26/21   Darliss Cheney, MD  dantrolene (DANTRIUM) 50 MG capsule Take 1 capsule (50 mg total) by mouth 4 (four) times daily. 1 cap at bedtime for 3 days, 1 cap twice daily for 3 days, then 1 cap three x daily 03/26/21   Darliss Cheney, MD  divalproex (DEPAKOTE) 500 MG DR tablet Take 1 tablet (500 mg total) by mouth every 12 (twelve) hours. 03/26/21 04/25/21  Darliss Cheney, MD  docusate sodium (COLACE) 100 MG capsule Take 1 capsule (100 mg total) by mouth 2 (two) times daily. 11/03/20   Samella Parr, NP  DULoxetine 40 MG CPEP  Take 40 mg by mouth daily. 03/26/21 04/25/21  Darliss Cheney, MD  gabapentin (NEURONTIN) 100 MG capsule Take 2 capsules (200 mg total) by mouth at bedtime. 03/26/21   Darliss Cheney, MD  lisinopril (ZESTRIL) 10 MG tablet Take 1 tablet (10 mg total) by mouth daily. 08/17/19   Love, Ivan Anchors, PA-C  loperamide (IMODIUM A-D) 2 MG tablet Take 2 mg by mouth every 6 (six) hours as needed for diarrhea or loose stools.    [provider]  Multiple Vitamin (MULTIVITAMIN WITH MINERALS) TABS tablet Take 1 tablet by mouth daily. 08/17/19   Love, Ivan Anchors, PA-C  Nutritional Supplements (NUTRITIONAL SUPPLEMENT PO) Take 120 mLs by mouth in the morning and at bedtime.    [provider]  omeprazole (PRILOSEC) 40 MG capsule Take 40 mg by mouth daily. 05/04/19   [provider]  ondansetron (ZOFRAN) 4 MG tablet Take 4 mg by mouth every 6 (six) hours as needed for nausea or vomiting.    [provider]  QUEtiapine (SEROQUEL) 100 MG tablet Take 1 tablet (100 mg total) by mouth at bedtime. 03/26/21   Darliss Cheney, MD  thiamine 100 MG tablet Take 1 tablet (100 mg total) by mouth daily. 08/17/19   Love, Ivan Anchors, PA-C  traMADol (ULTRAM) 50 MG tablet Take 1 tablet (50 mg total)  by mouth every 6 (six) hours as needed for moderate pain. 03/26/21   Darliss Cheney, MD  vitamin B-12 1000 MCG tablet Take 1 tablet (1,000 mcg total) by mouth daily. 08/17/19   Love, Ivan Anchors, PA-C  Zinc Oxide (DESITIN) 40 % PSTE Apply 1 application. topically See admin instructions. Apply to sacrum every shift    [provider]      Allergies    Tizanidine hcl and Tuberculin tests    Review of Systems   Review of Systems  All other systems reviewed and are negative.   Physical Exam Updated Vital Signs BP 109/70   Pulse 67   Temp 98.3 F (36.8 C)   Resp 16   Ht 1.854 m (6\' 1" )   Wt 81.6 kg   SpO2 98%   BMI 23.75 kg/m  Physical Exam Vitals and nursing note reviewed.  Constitutional:      General: He  is not in acute distress.    Appearance: Normal appearance. He is well-developed. He is not toxic-appearing.  HENT:     Head:   Eyes:     General: Lids are normal.     Conjunctiva/sclera: Conjunctivae normal.     Pupils: Pupils are equal, round, and reactive to light.  Neck:     Thyroid: No thyroid mass.     Trachea: No tracheal deviation.  Cardiovascular:     Rate and Rhythm: Normal rate and regular rhythm.     Heart sounds: Normal heart sounds. No murmur heard.    No gallop.  Pulmonary:     Effort: Pulmonary effort is normal. No respiratory distress.     Breath sounds: Normal breath sounds. No stridor. No decreased breath sounds, wheezing, rhonchi or rales.  Abdominal:     General: There is no distension.     Palpations: Abdomen is soft.     Tenderness: There is no abdominal tenderness. There is no rebound.  Musculoskeletal:        General: No tenderness. Normal range of motion.     Cervical back: Normal range of motion and neck supple.  Skin:    General: Skin is warm and dry.     Findings: No abrasion or rash.  Neurological:     Mental Status: He is alert and oriented to person, place, and time. Mental status is at baseline.     GCS: GCS eye subscore is 4. GCS verbal subscore is 5. GCS motor subscore is 6.     Cranial Nerves: Cranial nerves 2-12 are intact.  Psychiatric:        Attention and Perception: Attention normal.        Speech: Speech normal.        Behavior: Behavior normal.     ED Results / Procedures / Treatments   Labs (all labs ordered are listed, but only abnormal results are displayed) Labs Reviewed - No data to display  EKG None  Radiology No results found.  Procedures Procedures    Medications Ordered in ED Medications  acetaminophen (TYLENOL) tablet 650 mg (has no administration in time range)    ED Course/ Medical Decision Making/ A&P                             Medical Decision Making Amount and/or Complexity of Data  Reviewed Radiology: ordered.  Risk OTC drugs.   ET of head and cervical spine for interpretation showed no acute findings.  Patient is  at his neurological baseline at this time.  No further workup required.  Will discharge back to his facility        Final Clinical Impression(s) / ED Diagnoses Final diagnoses:  None    Rx / DC Orders ED Discharge Orders     None         Lacretia Leigh, MD 04/07/22 1314

## 2022-05-11 ENCOUNTER — Ambulatory Visit (INDEPENDENT_AMBULATORY_CARE_PROVIDER_SITE_OTHER): Payer: Medicaid Other | Admitting: Licensed Clinical Social Worker

## 2022-05-11 DIAGNOSIS — F319 Bipolar disorder, unspecified: Secondary | ICD-10-CM

## 2022-05-11 DIAGNOSIS — S069X0D Unspecified intracranial injury without loss of consciousness, subsequent encounter: Secondary | ICD-10-CM
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
## 2023-09-11 DIAGNOSIS — M6281 Muscle weakness (generalized): Secondary | ICD-10-CM | POA: Diagnosis not present

## 2023-09-11 DIAGNOSIS — S062X6S Diffuse traumatic brain injury with loss of consciousness greater than 24 hours without return to pre-existing conscious level with patient surviving, sequela: Secondary | ICD-10-CM | POA: Diagnosis not present

## 2023-09-11 DIAGNOSIS — R2681 Unsteadiness on feet: Secondary | ICD-10-CM | POA: Diagnosis not present

## 2023-09-13 DIAGNOSIS — R2681 Unsteadiness on feet: Secondary | ICD-10-CM | POA: Diagnosis not present

## 2023-09-13 DIAGNOSIS — S062X6S Diffuse traumatic brain injury with loss of consciousness greater than 24 hours without return to pre-existing conscious level with patient surviving, sequela: Secondary | ICD-10-CM | POA: Diagnosis not present

## 2023-09-13 DIAGNOSIS — M6281 Muscle weakness (generalized): Secondary | ICD-10-CM | POA: Diagnosis not present

## 2023-09-15 DIAGNOSIS — R2681 Unsteadiness on feet: Secondary | ICD-10-CM | POA: Diagnosis not present

## 2023-09-15 DIAGNOSIS — S062X6S Diffuse traumatic brain injury with loss of consciousness greater than 24 hours without return to pre-existing conscious level with patient surviving, sequela: Secondary | ICD-10-CM | POA: Diagnosis not present

## 2023-09-15 DIAGNOSIS — M6281 Muscle weakness (generalized): Secondary | ICD-10-CM | POA: Diagnosis not present

## 2023-09-18 DIAGNOSIS — R2681 Unsteadiness on feet: Secondary | ICD-10-CM | POA: Diagnosis not present

## 2023-09-18 DIAGNOSIS — S062X6S Diffuse traumatic brain injury with loss of consciousness greater than 24 hours without return to pre-existing conscious level with patient surviving, sequela: Secondary | ICD-10-CM | POA: Diagnosis not present

## 2023-09-18 DIAGNOSIS — M6281 Muscle weakness (generalized): Secondary | ICD-10-CM | POA: Diagnosis not present

## 2023-09-20 DIAGNOSIS — R2681 Unsteadiness on feet: Secondary | ICD-10-CM | POA: Diagnosis not present

## 2023-09-20 DIAGNOSIS — M6281 Muscle weakness (generalized): Secondary | ICD-10-CM | POA: Diagnosis not present

## 2023-09-20 DIAGNOSIS — S062X6S Diffuse traumatic brain injury with loss of consciousness greater than 24 hours without return to pre-existing conscious level with patient surviving, sequela: Secondary | ICD-10-CM | POA: Diagnosis not present

## 2023-09-22 DIAGNOSIS — S062X6S Diffuse traumatic brain injury with loss of consciousness greater than 24 hours without return to pre-existing conscious level with patient surviving, sequela: Secondary | ICD-10-CM | POA: Diagnosis not present

## 2023-09-22 DIAGNOSIS — R2681 Unsteadiness on feet: Secondary | ICD-10-CM | POA: Diagnosis not present

## 2023-09-22 DIAGNOSIS — M6281 Muscle weakness (generalized): Secondary | ICD-10-CM | POA: Diagnosis not present

## 2023-09-25 DIAGNOSIS — M6281 Muscle weakness (generalized): Secondary | ICD-10-CM | POA: Diagnosis not present

## 2023-09-25 DIAGNOSIS — R2681 Unsteadiness on feet: Secondary | ICD-10-CM | POA: Diagnosis not present

## 2023-09-25 DIAGNOSIS — S062X6S Diffuse traumatic brain injury with loss of consciousness greater than 24 hours without return to pre-existing conscious level with patient surviving, sequela: Secondary | ICD-10-CM | POA: Diagnosis not present

## 2023-09-26 DIAGNOSIS — G894 Chronic pain syndrome: Secondary | ICD-10-CM | POA: Diagnosis not present

## 2023-09-27 DIAGNOSIS — R2681 Unsteadiness on feet: Secondary | ICD-10-CM | POA: Diagnosis not present

## 2023-09-27 DIAGNOSIS — M6281 Muscle weakness (generalized): Secondary | ICD-10-CM | POA: Diagnosis not present

## 2023-09-27 DIAGNOSIS — S062X6S Diffuse traumatic brain injury with loss of consciousness greater than 24 hours without return to pre-existing conscious level with patient surviving, sequela: Secondary | ICD-10-CM | POA: Diagnosis not present

## 2023-09-29 DIAGNOSIS — S062X6S Diffuse traumatic brain injury with loss of consciousness greater than 24 hours without return to pre-existing conscious level with patient surviving, sequela: Secondary | ICD-10-CM | POA: Diagnosis not present

## 2023-09-29 DIAGNOSIS — R2681 Unsteadiness on feet: Secondary | ICD-10-CM | POA: Diagnosis not present

## 2023-09-29 DIAGNOSIS — M6281 Muscle weakness (generalized): Secondary | ICD-10-CM | POA: Diagnosis not present

## 2023-09-29 DIAGNOSIS — G4709 Other insomnia: Secondary | ICD-10-CM | POA: Diagnosis not present

## 2023-09-29 DIAGNOSIS — I10 Essential (primary) hypertension: Secondary | ICD-10-CM | POA: Diagnosis not present

## 2023-10-02 DIAGNOSIS — M6281 Muscle weakness (generalized): Secondary | ICD-10-CM | POA: Diagnosis not present

## 2023-10-02 DIAGNOSIS — S062X6S Diffuse traumatic brain injury with loss of consciousness greater than 24 hours without return to pre-existing conscious level with patient surviving, sequela: Secondary | ICD-10-CM | POA: Diagnosis not present

## 2023-10-02 DIAGNOSIS — R2681 Unsteadiness on feet: Secondary | ICD-10-CM | POA: Diagnosis not present

## 2023-10-04 DIAGNOSIS — S062X6S Diffuse traumatic brain injury with loss of consciousness greater than 24 hours without return to pre-existing conscious level with patient surviving, sequela: Secondary | ICD-10-CM | POA: Diagnosis not present

## 2023-10-04 DIAGNOSIS — F17200 Nicotine dependence, unspecified, uncomplicated: Secondary | ICD-10-CM | POA: Diagnosis not present

## 2023-10-04 DIAGNOSIS — R2681 Unsteadiness on feet: Secondary | ICD-10-CM | POA: Diagnosis not present

## 2023-10-04 DIAGNOSIS — S062X6D Diffuse traumatic brain injury with loss of consciousness greater than 24 hours without return to pre-existing conscious level with patient surviving, subsequent encounter: Secondary | ICD-10-CM | POA: Diagnosis not present

## 2023-10-04 DIAGNOSIS — Z720 Tobacco use: Secondary | ICD-10-CM | POA: Diagnosis not present

## 2023-10-04 DIAGNOSIS — M6281 Muscle weakness (generalized): Secondary | ICD-10-CM | POA: Diagnosis not present

## 2023-10-04 DIAGNOSIS — G8929 Other chronic pain: Secondary | ICD-10-CM | POA: Diagnosis not present

## 2023-10-06 DIAGNOSIS — G4709 Other insomnia: Secondary | ICD-10-CM | POA: Diagnosis not present

## 2023-10-06 DIAGNOSIS — S062X6S Diffuse traumatic brain injury with loss of consciousness greater than 24 hours without return to pre-existing conscious level with patient surviving, sequela: Secondary | ICD-10-CM | POA: Diagnosis not present

## 2023-10-06 DIAGNOSIS — R2681 Unsteadiness on feet: Secondary | ICD-10-CM | POA: Diagnosis not present

## 2023-10-06 DIAGNOSIS — M6281 Muscle weakness (generalized): Secondary | ICD-10-CM | POA: Diagnosis not present

## 2023-10-09 DIAGNOSIS — G800 Spastic quadriplegic cerebral palsy: Secondary | ICD-10-CM | POA: Diagnosis not present

## 2023-10-09 DIAGNOSIS — S062X6S Diffuse traumatic brain injury with loss of consciousness greater than 24 hours without return to pre-existing conscious level with patient surviving, sequela: Secondary | ICD-10-CM | POA: Diagnosis not present

## 2023-10-09 DIAGNOSIS — M6281 Muscle weakness (generalized): Secondary | ICD-10-CM | POA: Diagnosis not present

## 2023-10-09 DIAGNOSIS — R2681 Unsteadiness on feet: Secondary | ICD-10-CM | POA: Diagnosis not present

## 2023-10-30 DIAGNOSIS — G894 Chronic pain syndrome: Secondary | ICD-10-CM | POA: Diagnosis not present

## 2023-10-31 DIAGNOSIS — G800 Spastic quadriplegic cerebral palsy: Secondary | ICD-10-CM | POA: Diagnosis not present

## 2023-11-03 DIAGNOSIS — I739 Peripheral vascular disease, unspecified: Secondary | ICD-10-CM | POA: Diagnosis not present

## 2023-11-03 DIAGNOSIS — L602 Onychogryphosis: Secondary | ICD-10-CM | POA: Diagnosis not present

## 2023-11-03 DIAGNOSIS — L603 Nail dystrophy: Secondary | ICD-10-CM | POA: Diagnosis not present
